# Patient Record
Sex: Female | Born: 1940
Health system: Southern US, Community
[De-identification: ages and names within clinical notes are randomized; demographics above are authoritative.]

## PROBLEM LIST (undated history)

## (undated) DIAGNOSIS — I4891 Unspecified atrial fibrillation: Secondary | ICD-10-CM

## (undated) DIAGNOSIS — I10 Essential (primary) hypertension: Secondary | ICD-10-CM

## (undated) DIAGNOSIS — F419 Anxiety disorder, unspecified: Secondary | ICD-10-CM

## (undated) DIAGNOSIS — R32 Unspecified urinary incontinence: Secondary | ICD-10-CM

## (undated) DIAGNOSIS — R5383 Other fatigue: Secondary | ICD-10-CM

## (undated) DIAGNOSIS — E7800 Pure hypercholesterolemia, unspecified: Secondary | ICD-10-CM

## (undated) DIAGNOSIS — I499 Cardiac arrhythmia, unspecified: Secondary | ICD-10-CM

## (undated) DIAGNOSIS — M1990 Unspecified osteoarthritis, unspecified site: Secondary | ICD-10-CM

## (undated) DIAGNOSIS — F329 Major depressive disorder, single episode, unspecified: Secondary | ICD-10-CM

## (undated) DIAGNOSIS — F32A Depression, unspecified: Secondary | ICD-10-CM

## (undated) HISTORY — DX: Unspecified urinary incontinence: R32

## (undated) HISTORY — PX: CATARACT EXTRACTION, BILATERAL: SHX1313

## (undated) HISTORY — PX: CARPAL TUNNEL RELEASE: SHX101

## (undated) HISTORY — PX: CATARACT EXTRACTION: SUR2

## (undated) HISTORY — PX: ABDOMINAL HYSTERECTOMY: SHX81

## (undated) HISTORY — PX: HERNIA REPAIR: SHX51

## (undated) HISTORY — DX: Other fatigue: R53.83

## (undated) HISTORY — DX: Essential (primary) hypertension: I10

---

## 2009-12-31 HISTORY — PX: MENISCUS REPAIR: SHX5179

## 2015-02-07 DIAGNOSIS — E785 Hyperlipidemia, unspecified: Secondary | ICD-10-CM | POA: Diagnosis not present

## 2015-02-07 DIAGNOSIS — M25441 Effusion, right hand: Secondary | ICD-10-CM | POA: Diagnosis not present

## 2015-02-07 DIAGNOSIS — F411 Generalized anxiety disorder: Secondary | ICD-10-CM | POA: Diagnosis not present

## 2015-02-07 DIAGNOSIS — I499 Cardiac arrhythmia, unspecified: Secondary | ICD-10-CM | POA: Diagnosis not present

## 2015-03-29 DIAGNOSIS — R1031 Right lower quadrant pain: Secondary | ICD-10-CM | POA: Diagnosis not present

## 2015-03-29 DIAGNOSIS — R350 Frequency of micturition: Secondary | ICD-10-CM | POA: Diagnosis not present

## 2015-03-29 DIAGNOSIS — R319 Hematuria, unspecified: Secondary | ICD-10-CM | POA: Diagnosis not present

## 2015-03-29 DIAGNOSIS — R1032 Left lower quadrant pain: Secondary | ICD-10-CM | POA: Diagnosis not present

## 2015-03-30 DIAGNOSIS — R1031 Right lower quadrant pain: Secondary | ICD-10-CM | POA: Diagnosis not present

## 2015-03-30 DIAGNOSIS — R1032 Left lower quadrant pain: Secondary | ICD-10-CM | POA: Diagnosis not present

## 2015-04-21 DIAGNOSIS — R350 Frequency of micturition: Secondary | ICD-10-CM | POA: Diagnosis not present

## 2015-05-06 ENCOUNTER — Emergency Department (HOSPITAL_COMMUNITY): Payer: Medicare Other

## 2015-05-06 ENCOUNTER — Emergency Department (HOSPITAL_COMMUNITY)
Admission: EM | Admit: 2015-05-06 | Discharge: 2015-05-07 | Disposition: A | Discharge status: Home / Self Care | Payer: Medicare Other | Attending: Emergency Medicine | Admitting: Emergency Medicine

## 2015-05-06 ENCOUNTER — Encounter (HOSPITAL_COMMUNITY): Payer: Self-pay

## 2015-05-06 DIAGNOSIS — Z90710 Acquired absence of both cervix and uterus: Secondary | ICD-10-CM | POA: Insufficient documentation

## 2015-05-06 DIAGNOSIS — R208 Other disturbances of skin sensation: Secondary | ICD-10-CM | POA: Diagnosis not present

## 2015-05-06 DIAGNOSIS — E876 Hypokalemia: Secondary | ICD-10-CM | POA: Diagnosis not present

## 2015-05-06 DIAGNOSIS — R1110 Vomiting, unspecified: Secondary | ICD-10-CM | POA: Diagnosis not present

## 2015-05-06 DIAGNOSIS — M5136 Other intervertebral disc degeneration, lumbar region: Secondary | ICD-10-CM | POA: Diagnosis not present

## 2015-05-06 DIAGNOSIS — M4307 Spondylolysis, lumbosacral region: Secondary | ICD-10-CM | POA: Diagnosis not present

## 2015-05-06 DIAGNOSIS — R1011 Right upper quadrant pain: Secondary | ICD-10-CM | POA: Diagnosis not present

## 2015-05-06 DIAGNOSIS — R791 Abnormal coagulation profile: Secondary | ICD-10-CM | POA: Diagnosis not present

## 2015-05-06 DIAGNOSIS — Z79899 Other long term (current) drug therapy: Secondary | ICD-10-CM | POA: Insufficient documentation

## 2015-05-06 DIAGNOSIS — R5383 Other fatigue: Secondary | ICD-10-CM | POA: Diagnosis not present

## 2015-05-06 DIAGNOSIS — M47817 Spondylosis without myelopathy or radiculopathy, lumbosacral region: Secondary | ICD-10-CM | POA: Diagnosis not present

## 2015-05-06 DIAGNOSIS — R109 Unspecified abdominal pain: Secondary | ICD-10-CM

## 2015-05-06 DIAGNOSIS — E780 Pure hypercholesterolemia: Secondary | ICD-10-CM | POA: Diagnosis not present

## 2015-05-06 DIAGNOSIS — M545 Low back pain: Secondary | ICD-10-CM | POA: Diagnosis not present

## 2015-05-06 DIAGNOSIS — F329 Major depressive disorder, single episode, unspecified: Secondary | ICD-10-CM | POA: Insufficient documentation

## 2015-05-06 DIAGNOSIS — R1031 Right lower quadrant pain: Secondary | ICD-10-CM | POA: Diagnosis not present

## 2015-05-06 DIAGNOSIS — Z791 Long term (current) use of non-steroidal anti-inflammatories (NSAID): Secondary | ICD-10-CM | POA: Diagnosis not present

## 2015-05-06 DIAGNOSIS — R1030 Lower abdominal pain, unspecified: Secondary | ICD-10-CM | POA: Diagnosis present

## 2015-05-06 DIAGNOSIS — N281 Cyst of kidney, acquired: Secondary | ICD-10-CM | POA: Diagnosis not present

## 2015-05-06 DIAGNOSIS — R52 Pain, unspecified: Secondary | ICD-10-CM

## 2015-05-06 DIAGNOSIS — M4806 Spinal stenosis, lumbar region: Secondary | ICD-10-CM | POA: Diagnosis not present

## 2015-05-06 DIAGNOSIS — R918 Other nonspecific abnormal finding of lung field: Secondary | ICD-10-CM | POA: Diagnosis not present

## 2015-05-06 HISTORY — DX: Depression, unspecified: F32.A

## 2015-05-06 HISTORY — DX: Pure hypercholesterolemia, unspecified: E78.00

## 2015-05-06 HISTORY — DX: Major depressive disorder, single episode, unspecified: F32.9

## 2015-05-06 LAB — COMPREHENSIVE METABOLIC PANEL WITH GFR
ALT: 18 U/L (ref 14–54)
AST: 27 U/L (ref 15–41)
Albumin: 4.4 g/dL (ref 3.5–5.0)
Alkaline Phosphatase: 73 U/L (ref 38–126)
Anion gap: 8 (ref 5–15)
BUN: 16 mg/dL (ref 6–20)
CO2: 26 mmol/L (ref 22–32)
Calcium: 8.9 mg/dL (ref 8.9–10.3)
Chloride: 98 mmol/L — ABNORMAL LOW (ref 101–111)
Creatinine, Ser: 0.64 mg/dL (ref 0.44–1.00)
GFR calc Af Amer: >60 mL/min
GFR calc non Af Amer: >60 mL/min
Glucose, Bld: 115 mg/dL — ABNORMAL HIGH (ref 70–99)
Potassium: 3.3 mmol/L — ABNORMAL LOW (ref 3.5–5.1)
Sodium: 132 mmol/L — ABNORMAL LOW (ref 135–145)
Total Bilirubin: 0.9 mg/dL (ref 0.3–1.2)
Total Protein: 7.4 g/dL (ref 6.5–8.1)

## 2015-05-06 LAB — URINALYSIS, ROUTINE W REFLEX MICROSCOPIC
Bilirubin Urine: NEGATIVE
GLUCOSE, UA: NEGATIVE mg/dL
Ketones, ur: NEGATIVE mg/dL
LEUKOCYTES UA: NEGATIVE
Nitrite: NEGATIVE
PH: 6 (ref 5.0–8.0)
PROTEIN: NEGATIVE mg/dL
SPECIFIC GRAVITY, URINE: 1.013 (ref 1.005–1.030)
Urobilinogen, UA: 0.2 mg/dL (ref 0.0–1.0)

## 2015-05-06 LAB — CBC WITH DIFFERENTIAL/PLATELET
BASOS PCT: 0 % (ref 0–1)
Basophils Absolute: 0 10*3/uL (ref 0.0–0.1)
EOS ABS: 0 10*3/uL (ref 0.0–0.7)
EOS PCT: 1 % (ref 0–5)
HCT: 37.7 % (ref 36.0–46.0)
Hemoglobin: 12.6 g/dL (ref 12.0–15.0)
Lymphocytes Relative: 23 % (ref 12–46)
Lymphs Abs: 1.6 10*3/uL (ref 0.7–4.0)
MCH: 32 pg (ref 26.0–34.0)
MCHC: 33.4 g/dL (ref 30.0–36.0)
MCV: 95.7 fL (ref 78.0–100.0)
MONO ABS: 0.5 10*3/uL (ref 0.1–1.0)
Monocytes Relative: 7 % (ref 3–12)
NEUTROS PCT: 69 % (ref 43–77)
Neutro Abs: 4.8 10*3/uL (ref 1.7–7.7)
PLATELETS: 201 10*3/uL (ref 150–400)
RBC: 3.94 MIL/uL (ref 3.87–5.11)
RDW: 12.7 % (ref 11.5–15.5)
WBC: 7 10*3/uL (ref 4.0–10.5)

## 2015-05-06 LAB — URINE MICROSCOPIC-ADD ON

## 2015-05-06 LAB — I-STAT TROPONIN, ED: Troponin i, poc: 0.03 ng/mL (ref 0.00–0.08)

## 2015-05-06 LAB — D-DIMER, QUANTITATIVE: D-Dimer, Quant: 1.1 ug/mL-FEU — ABNORMAL HIGH (ref 0.00–0.48)

## 2015-05-06 MED ORDER — SODIUM CHLORIDE 0.9 % IV BOLUS (SEPSIS)
1000.0000 mL | Freq: Once | INTRAVENOUS | Status: AC
  Administered 2015-05-06: 1000 mL via INTRAVENOUS

## 2015-05-06 MED ORDER — IOHEXOL 350 MG/ML SOLN
100.0000 mL | Freq: Once | INTRAVENOUS | Status: AC | PRN
  Administered 2015-05-06: 100 mL via INTRAVENOUS

## 2015-05-06 MED ORDER — SODIUM CHLORIDE 0.9 % IV BOLUS (SEPSIS)
500.0000 mL | Freq: Once | INTRAVENOUS | Status: AC
  Administered 2015-05-06: 500 mL via INTRAVENOUS

## 2015-05-06 MED ORDER — KETOROLAC TROMETHAMINE 30 MG/ML IJ SOLN
30.0000 mg | Freq: Once | INTRAMUSCULAR | Status: AC
  Administered 2015-05-06: 30 mg via INTRAVENOUS
  Filled 2015-05-06: qty 1

## 2015-05-06 MED ORDER — PROMETHAZINE HCL 25 MG/ML IJ SOLN
12.5000 mg | Freq: Once | INTRAMUSCULAR | Status: AC
  Administered 2015-05-06: 12.5 mg via INTRAVENOUS
  Filled 2015-05-06: qty 1

## 2015-05-06 MED ORDER — HYDROMORPHONE HCL 1 MG/ML IJ SOLN
1.0000 mg | Freq: Once | INTRAMUSCULAR | Status: AC
  Administered 2015-05-06: 1 mg via INTRAVENOUS
  Filled 2015-05-06: qty 1

## 2015-05-06 MED ORDER — ONDANSETRON HCL 4 MG/2ML IJ SOLN
4.0000 mg | Freq: Once | INTRAMUSCULAR | Status: AC
  Administered 2015-05-06: 4 mg via INTRAVENOUS
  Filled 2015-05-06: qty 2

## 2015-05-06 MED ORDER — MORPHINE SULFATE 4 MG/ML IJ SOLN
4.0000 mg | Freq: Once | INTRAMUSCULAR | Status: AC
  Administered 2015-05-06: 4 mg via INTRAVENOUS
  Filled 2015-05-06: qty 1

## 2015-05-06 MED ORDER — OXYCODONE-ACETAMINOPHEN 5-325 MG PO TABS: 1.0000 | ORAL_TABLET | ORAL | Status: DC | PRN

## 2015-05-06 MED ORDER — PROMETHAZINE HCL 25 MG RE SUPP: 25.0000 mg | Freq: Four times a day (QID) | RECTAL | Status: DC | PRN

## 2015-05-06 NOTE — ED Notes (Signed)
Patient requests something for pain. Dr. Estell HarpinZammit made aware of same. Verbal orders for 4mg  Morphine IV and 12.5mg  Phenergan IV. Orders placed.

## 2015-05-06 NOTE — ED Provider Notes (Signed)
CSN: [id number]     Arrival date & time 05/06/15  1612 History   First MD Initiated Contact with Patient 05/06/15 1719     Chief Complaint  Patient presents with  . Flank Pain     (Consider location/radiation/quality/duration/timing/severity/associated sxs/prior Treatment) Patient is a 74 y.o. female presenting with flank pain. The history is provided by the patient (the pt complains of severe back pain worse in the right flank.  pt also having vomiting).  Flank Pain This is a new problem. The current episode started 1 to 2 hours ago. The problem occurs constantly. The problem has not changed since onset.Associated symptoms include abdominal pain. Pertinent negatives include no chest pain and no headaches. Nothing aggravates the symptoms. Nothing relieves the symptoms.    Past Medical History  Diagnosis Date  . Hypercholesteremia   . Depression    Past Surgical History  Procedure Laterality Date  . Abdominal hysterectomy    . Carpal tunnel release    . Meniscus repair    . Hernia repair     History reviewed. No pertinent family history. History  Substance Use Topics  . Smoking status: Never Smoker   . Smokeless tobacco: Not on file  . Alcohol Use: Yes     Comment: social   OB History    No data available     Review of Systems  Constitutional: Negative for appetite change and fatigue.  HENT: Negative for congestion, ear discharge and sinus pressure.   Eyes: Negative for discharge.  Respiratory: Negative for cough.   Cardiovascular: Negative for chest pain.  Gastrointestinal: Positive for vomiting and abdominal pain. Negative for diarrhea.  Genitourinary: Positive for flank pain. Negative for frequency and hematuria.  Musculoskeletal: Negative for back pain.  Skin: Negative for rash.  Neurological: Negative for seizures and headaches.  Psychiatric/Behavioral: Negative for hallucinations.      Allergies  Review of patient's allergies indicates no known  allergies.  Home Medications   Prior to Admission medications   Medication Sig Start Date End Date Taking? Authorizing Provider  CALCIUM PO Take 2,400 mg by mouth daily.   Yes Historical Provider, MD  FLUoxetine (PROZAC) 40 MG capsule Take 1 capsule by mouth daily. 03/21/15  Yes Historical Provider, MD  metoprolol tartrate (LOPRESSOR) 25 MG tablet Take 1 tablet by mouth daily. 02/07/15  Yes Historical Provider, MD  naproxen sodium (ANAPROX) 220 MG tablet Take 440 mg by mouth once.   Yes Historical Provider, MD  simvastatin (ZOCOR) 40 MG tablet Take 40 mg by mouth daily.   Yes Historical Provider, MD   BP 145/53 mmHg  Pulse 62  Temp(Src) 97.4 F (36.3 C) (Oral)  Resp 19  SpO2 95% Physical Exam  Constitutional: She is oriented to person, place, and time. She appears well-developed.  HENT:  Head: Normocephalic.  Eyes: Conjunctivae and EOM are normal. No scleral icterus.  Neck: Neck supple. No thyromegaly present.  Cardiovascular: Normal rate and regular rhythm.  Exam reveals no gallop and no friction rub.   No murmur heard. Pulmonary/Chest: No stridor. She has no wheezes. She has no rales. She exhibits no tenderness.  Abdominal: She exhibits no distension. There is no tenderness. There is no rebound.  Genitourinary:  Moderate tender right flank  Musculoskeletal: Normal range of motion. She exhibits no edema.  Lymphadenopathy:    She has no cervical adenopathy.  Neurological: She is oriented to person, place, and time. She exhibits normal muscle tone. Coordination normal.  Skin: No rash noted. No erythema.  Psychiatric: She has a normal mood and affect. Her behavior is normal.    ED Course  Procedures (including critical care time) Labs Review Labs Reviewed  URINALYSIS, ROUTINE W REFLEX MICROSCOPIC - Abnormal; Notable for the following:    Hgb urine dipstick MODERATE (*)    All other components within normal limits  COMPREHENSIVE METABOLIC PANEL - Abnormal; Notable for the  following:    Sodium 132 (*)    Potassium 3.3 (*)    Chloride 98 (*)    Glucose, Bld 115 (*)    All other components within normal limits  D-DIMER, QUANTITATIVE - Abnormal; Notable for the following:    D-Dimer, Quant 1.10 (*)    All other components within normal limits  CBC WITH DIFFERENTIAL/PLATELET  URINE MICROSCOPIC-ADD ON  Rosezena SensorI-STAT TROPOININ, ED    Imaging Review Dg Chest 2 View  05/06/2015   CLINICAL DATA:  Mid back pain and fatigue beginning today. Vomiting.  EXAM: CHEST  2 VIEW  COMPARISON:  None.  FINDINGS: The cardiomediastinal silhouette is within normal limits. The lungs are well inflated and clear. No pleural effusion or pneumothorax is seen. No acute osseous abnormality is identified.  IMPRESSION: No active cardiopulmonary disease.   Electronically Signed   By: Sebastian AcheAllen  Grady   On: 05/06/2015 18:23   Ct Renal Stone Study  05/06/2015   CLINICAL DATA:  Bilateral flank pain since this morning. The pain is now on the right. Vomiting.  EXAM: CT ABDOMEN AND PELVIS WITHOUT CONTRAST  TECHNIQUE: Multidetector CT imaging of the abdomen and pelvis was performed following the standard protocol without IV contrast.  COMPARISON:  None.  FINDINGS: 3.9 cm exophytic mid to upper pole right renal cyst. There is also a 1.6 cm upper pole left renal cyst. No urinary tract calculi or hydronephrosis are seen. Normal non contrasted appearance of the liver, spleen, pancreas, gallbladder and adrenal glands. Surgically absent uterus. No adnexal masses or enlarged lymph nodes. No gastrointestinal abnormalities. Normal appearing appendix.  Minimal linear atelectasis or scarring at the lung bases. Lumbar and lower thoracic spine degenerative changes. These include facet degenerative changes with associated grade 1 anterolisthesis at the L4-5 level. Atheromatous arterial calcifications are noted.  IMPRESSION: 1. No acute abnormality. 2. Bilateral renal cysts.   Electronically Signed   By: Beckie SaltsSteven  Reid M.D.   On:  05/06/2015 18:26     EKG Interpretation   Date/Time:  Friday May 06 2015 20:49:20 EDT Ventricular Rate:  60 PR Interval:  176 QRS Duration: 110 QT Interval:  426 QTC Calculation: 426 R Axis:   -15 Text Interpretation:  Sinus rhythm Atrial premature complex Probable left  atrial enlargement Borderline left axis deviation Confirmed by Artemisia Auvil  MD,  Jomarie LongsJOSEPH [phone]([id number]) on 05/06/2015 9:15:46 PM      MDM   Final diagnoses:  Pain    Flank pain and renal cyst,  tx with  Percocet,  Culture urine,  Follow up with urology     Bethann BerkshireJoseph Navid Lenzen, MD 05/06/15 [phone]

## 2015-05-06 NOTE — Discharge Instructions (Signed)
Follow up with [facility] next week °

## 2015-05-06 NOTE — ED Notes (Signed)
Pt states flank pain bil starting this morning. Now pain is settled into rt side.  Pt has been vomiting. Unknown for fever.  Pt denies increase in urinary frequency or burning but states she normally has some incontinence with frequency anyway.  Pt was seen on 3/30 for abdominal pain and has her results with her. States pain feels different but pain at that time did radiate to the back.

## 2015-05-06 NOTE — ED Notes (Signed)
Pt actively vomiting once in room. Pt family member asking if she can have pain meds. Explained Dr. Needed to access her first.

## 2015-05-07 ENCOUNTER — Emergency Department (HOSPITAL_COMMUNITY): Payer: Medicare Other

## 2015-05-07 ENCOUNTER — Emergency Department (HOSPITAL_COMMUNITY)
Admission: EM | Admit: 2015-05-07 | Discharge: 2015-05-08 | Disposition: A | Discharge status: Home / Self Care | Payer: Medicare Other | Attending: Emergency Medicine | Admitting: Emergency Medicine

## 2015-05-07 ENCOUNTER — Encounter (HOSPITAL_COMMUNITY): Payer: Self-pay | Admitting: Emergency Medicine

## 2015-05-07 DIAGNOSIS — M545 Low back pain, unspecified: Secondary | ICD-10-CM

## 2015-05-07 DIAGNOSIS — R208 Other disturbances of skin sensation: Secondary | ICD-10-CM | POA: Diagnosis not present

## 2015-05-07 DIAGNOSIS — M47817 Spondylosis without myelopathy or radiculopathy, lumbosacral region: Secondary | ICD-10-CM | POA: Diagnosis not present

## 2015-05-07 DIAGNOSIS — M4806 Spinal stenosis, lumbar region: Secondary | ICD-10-CM | POA: Diagnosis not present

## 2015-05-07 DIAGNOSIS — E780 Pure hypercholesterolemia: Secondary | ICD-10-CM | POA: Insufficient documentation

## 2015-05-07 DIAGNOSIS — M5136 Other intervertebral disc degeneration, lumbar region: Secondary | ICD-10-CM | POA: Diagnosis not present

## 2015-05-07 DIAGNOSIS — E876 Hypokalemia: Secondary | ICD-10-CM | POA: Diagnosis not present

## 2015-05-07 DIAGNOSIS — F329 Major depressive disorder, single episode, unspecified: Secondary | ICD-10-CM | POA: Diagnosis not present

## 2015-05-07 DIAGNOSIS — Z79899 Other long term (current) drug therapy: Secondary | ICD-10-CM | POA: Insufficient documentation

## 2015-05-07 DIAGNOSIS — M549 Dorsalgia, unspecified: Secondary | ICD-10-CM

## 2015-05-07 DIAGNOSIS — M4307 Spondylolysis, lumbosacral region: Secondary | ICD-10-CM | POA: Diagnosis not present

## 2015-05-07 LAB — COMPREHENSIVE METABOLIC PANEL
ALK PHOS: 67 U/L (ref 38–126)
ALT: 16 U/L (ref 14–54)
AST: 25 U/L (ref 15–41)
Albumin: 3.7 g/dL (ref 3.5–5.0)
Anion gap: 5 (ref 5–15)
BUN: 11 mg/dL (ref 6–20)
CO2: 27 mmol/L (ref 22–32)
Calcium: 8.6 mg/dL — ABNORMAL LOW (ref 8.9–10.3)
Chloride: 99 mmol/L — ABNORMAL LOW (ref 101–111)
Creatinine, Ser: 0.67 mg/dL (ref 0.44–1.00)
GFR calc non Af Amer: >60 mL/min (ref >60)
GLUCOSE: 103 mg/dL — AB (ref 70–99)
POTASSIUM: 3.1 mmol/L — AB (ref 3.5–5.1)
Sodium: 131 mmol/L — ABNORMAL LOW (ref 135–145)
TOTAL PROTEIN: 6.5 g/dL (ref 6.5–8.1)
Total Bilirubin: 0.8 mg/dL (ref 0.3–1.2)

## 2015-05-07 LAB — CBC WITH DIFFERENTIAL/PLATELET
Basophils Absolute: 0 10*3/uL (ref 0.0–0.1)
Basophils Relative: 0 % (ref 0–1)
EOS ABS: 0.1 10*3/uL (ref 0.0–0.7)
EOS PCT: 1 % (ref 0–5)
HEMATOCRIT: 34.4 % — AB (ref 36.0–46.0)
HEMOGLOBIN: 11.5 g/dL — AB (ref 12.0–15.0)
LYMPHS ABS: 1.1 10*3/uL (ref 0.7–4.0)
Lymphocytes Relative: 18 % (ref 12–46)
MCH: 32 pg (ref 26.0–34.0)
MCHC: 33.4 g/dL (ref 30.0–36.0)
MCV: 95.8 fL (ref 78.0–100.0)
MONOS PCT: 7 % (ref 3–12)
Monocytes Absolute: 0.5 10*3/uL (ref 0.1–1.0)
Neutro Abs: 4.6 10*3/uL (ref 1.7–7.7)
Neutrophils Relative %: 74 % (ref 43–77)
Platelets: 184 10*3/uL (ref 150–400)
RBC: 3.59 MIL/uL — ABNORMAL LOW (ref 3.87–5.11)
RDW: 12.8 % (ref 11.5–15.5)
WBC: 6.2 10*3/uL (ref 4.0–10.5)

## 2015-05-07 MED ORDER — PROMETHAZINE HCL 25 MG/ML IJ SOLN
12.5000 mg | Freq: Once | INTRAMUSCULAR | Status: AC
  Administered 2015-05-07: 12.5 mg via INTRAMUSCULAR
  Filled 2015-05-07: qty 1

## 2015-05-07 MED ORDER — ONDANSETRON HCL 4 MG/2ML IJ SOLN
4.0000 mg | Freq: Once | INTRAMUSCULAR | Status: AC
  Administered 2015-05-07: 4 mg via INTRAVENOUS
  Filled 2015-05-07: qty 2

## 2015-05-07 MED ORDER — LORAZEPAM 2 MG/ML IJ SOLN
1.0000 mg | Freq: Once | INTRAMUSCULAR | Status: AC
  Administered 2015-05-07: 1 mg via INTRAVENOUS
  Filled 2015-05-07: qty 1

## 2015-05-07 MED ORDER — KETOROLAC TROMETHAMINE 15 MG/ML IJ SOLN
15.0000 mg | Freq: Once | INTRAMUSCULAR | Status: AC
  Administered 2015-05-07: 15 mg via INTRAVENOUS
  Filled 2015-05-07: qty 1

## 2015-05-07 MED ORDER — LIDOCAINE 5 % EX PTCH
1.0000 | MEDICATED_PATCH | CUTANEOUS | Status: DC
  Administered 2015-05-08: 1 via TRANSDERMAL
  Filled 2015-05-07: qty 1

## 2015-05-07 MED ORDER — HYDROMORPHONE HCL 1 MG/ML IJ SOLN: 0.5000 mg | Freq: Once | INTRAMUSCULAR | Status: DC

## 2015-05-07 MED ORDER — MORPHINE SULFATE 4 MG/ML IJ SOLN
4.0000 mg | Freq: Once | INTRAMUSCULAR | Status: AC
  Administered 2015-05-07: 4 mg via INTRAVENOUS
  Filled 2015-05-07: qty 1

## 2015-05-07 NOTE — ED Provider Notes (Signed)
CSN: [id number]     Arrival date & time 05/07/15  1918 History   First MD Initiated Contact with Patient 05/07/15 1949     Chief Complaint  Patient presents with  . Back Pain     Patient is a 74 y.o. female presenting with back pain. The history is provided by the patient. No language interpreter was used.  Back Pain  Jenna Hogan presents for evaluation of right flank pain. She was seen in the emergency department last night for similar symptoms. She is visiting from [place]. 2 days ago she developed pain in her low back on both sides. Last night it became intense and severe in nature. She has associated vomiting. She denies any injuries, numbness, weakness, abdominal pain, chest pain, shortness of breath. She has a burning sensation in her right back. She was feeling better after she left the emergency department last night. She took her Percocet as prescribed. Today her pain is uncontrolled and she is unable to sit up without pain. Symptoms are severe, constant, worsening. She has associated vomiting related to the pain.  Past Medical History  Diagnosis Date  . Hypercholesteremia   . Depression    Past Surgical History  Procedure Laterality Date  . Abdominal hysterectomy    . Carpal tunnel release    . Meniscus repair    . Hernia repair     No family history on file. History  Substance Use Topics  . Smoking status: Never Smoker   . Smokeless tobacco: Not on file  . Alcohol Use: Yes     Comment: social   OB History    No data available     Review of Systems  Musculoskeletal: Positive for back pain.  All other systems reviewed and are negative.     Allergies  Review of patient's allergies indicates no known allergies.  Home Medications   Prior to Admission medications   Medication Sig Start Date End Date Taking? Authorizing Provider  CALCIUM PO Take 2,400 mg by mouth daily.    Historical Provider, MD  FLUoxetine (PROZAC) 40 MG capsule Take 1 capsule by mouth daily.  03/21/15   Historical Provider, MD  metoprolol tartrate (LOPRESSOR) 25 MG tablet Take 1 tablet by mouth daily. 02/07/15   Historical Provider, MD  naproxen sodium (ANAPROX) 220 MG tablet Take 440 mg by mouth once.    Historical Provider, MD  oxyCODONE-acetaminophen (PERCOCET) 5-325 MG per tablet Take 1 tablet by mouth every 4 (four) hours as needed. 05/06/15   Bethann Berkshire, MD  promethazine (PHENERGAN) 25 MG suppository Place 1 suppository (25 mg total) rectally every 6 (six) hours as needed for nausea or vomiting. 05/06/15   Bethann Berkshire, MD  simvastatin (ZOCOR) 40 MG tablet Take 40 mg by mouth daily.    Historical Provider, MD   BP 166/67 mmHg  Pulse 65  Temp(Src) 98.7 F (37.1 C) (Oral)  Resp 18  SpO2 95% Physical Exam  Constitutional: She is oriented to person, place, and time. She appears well-developed and well-nourished.  HENT:  Head: Normocephalic and atraumatic.  Cardiovascular: Normal rate and regular rhythm.   No murmur heard. Pulmonary/Chest: Effort normal and breath sounds normal. No respiratory distress.  Abdominal: Soft. There is no tenderness. There is no rebound and no guarding.  Mild right cva tenderness, no rash  Musculoskeletal: She exhibits no edema or tenderness.  Neurological: She is alert and oriented to person, place, and time.  5/5 strength in all four extremities  Skin: Skin is  warm and dry.  Psychiatric: She has a normal mood and affect. Her behavior is normal.  Nursing note and vitals reviewed.   ED Course  Procedures (including critical care time) Labs Review Labs Reviewed  COMPREHENSIVE METABOLIC PANEL - Abnormal; Notable for the following:    Sodium 131 (*)    Potassium 3.1 (*)    Chloride 99 (*)    Glucose, Bld 103 (*)    Calcium 8.6 (*)    All other components within normal limits  CBC WITH DIFFERENTIAL/PLATELET - Abnormal; Notable for the following:    RBC 3.59 (*)    Hemoglobin 11.5 (*)    HCT 34.4 (*)    All other components within normal  limits  URINALYSIS, ROUTINE W REFLEX MICROSCOPIC    Imaging Review Dg Chest 2 View  05/06/2015   CLINICAL DATA:  Mid back pain and fatigue beginning today. Vomiting.  EXAM: CHEST  2 VIEW  COMPARISON:  None.  FINDINGS: The cardiomediastinal silhouette is within normal limits. The lungs are well inflated and clear. No pleural effusion or pneumothorax is seen. No acute osseous abnormality is identified.  IMPRESSION: No active cardiopulmonary disease.   Electronically Signed   By: Sebastian AcheAllen  Grady   On: 05/06/2015 18:23   Ct Angio Chest Pe W/cm &/or Wo Cm  05/06/2015   CLINICAL DATA:  Back pain and elevated D-dimer  EXAM: CT ANGIOGRAPHY CHEST WITH CONTRAST  TECHNIQUE: Multidetector CT imaging of the chest was performed using the standard protocol during bolus administration of intravenous contrast. Multiplanar CT image reconstructions and MIPs were obtained to evaluate the vascular anatomy.  CONTRAST:  100mL OMNIPAQUE IOHEXOL 350 MG/ML SOLN  COMPARISON:  None  FINDINGS: Cardiovascular: There is good opacification of the pulmonary arteries. There is no pulmonary embolism. The thoracic aorta is normal in caliber and intact.  Lungs: Minimal posterior base atelectatic-appearing opacities are present. The lungs are otherwise clear.  Central airways: Patent  Effusions: None  Lymphadenopathy: None  Esophagus: Unremarkable  Upper abdomen: No significant abnormality. Upper pole left renal cyst is incompletely imaged on this study but was visible on the earlier abdominal CT.  Musculoskeletal: Moderate degenerative disc changes are present throughout the thoracic spine. No significant musculoskeletal lesions.  Review of the MIP images confirms the above findings.  IMPRESSION: Negative for pulmonary embolism. Mild atelectatic appearing opacities are present in the posterior dependent bases.   Electronically Signed   By: Ellery Plunkaniel R Mitchell M.D.   On: 05/06/2015 23:49   Ct Renal Stone Study  05/06/2015   CLINICAL DATA:  Bilateral  flank pain since this morning. The pain is now on the right. Vomiting.  EXAM: CT ABDOMEN AND PELVIS WITHOUT CONTRAST  TECHNIQUE: Multidetector CT imaging of the abdomen and pelvis was performed following the standard protocol without IV contrast.  COMPARISON:  None.  FINDINGS: 3.9 cm exophytic mid to upper pole right renal cyst. There is also a 1.6 cm upper pole left renal cyst. No urinary tract calculi or hydronephrosis are seen. Normal non contrasted appearance of the liver, spleen, pancreas, gallbladder and adrenal glands. Surgically absent uterus. No adnexal masses or enlarged lymph nodes. No gastrointestinal abnormalities. Normal appearing appendix.  Minimal linear atelectasis or scarring at the lung bases. Lumbar and lower thoracic spine degenerative changes. These include facet degenerative changes with associated grade 1 anterolisthesis at the L4-5 level. Atheromatous arterial calcifications are noted.  IMPRESSION: 1. No acute abnormality. 2. Bilateral renal cysts.   Electronically Signed   By: Beckie SaltsSteven  Reid  M.D.   On: 05/06/2015 18:26   [place] Abdomen Limited Ruq  05/06/2015   CLINICAL DATA:  Right upper quadrant pain  EXAM: US ABDOMEN LIMITED - RIGHT UPPER QUADRANT  COMPARISON:  None.  FINDINGS: Gallbladder:  No gallstones or wall thickening visualized. No sonographic Murphy sign noted.  Common bile duct:  Diameter: 4.6 mm, normal  Liver:  No focal lesion identified. Within normal limits in parenchymal echogenicity.  IMPRESSION: Normal   Electronically Signed   By: Ellery Plunkaniel R Mitchell M.D.   On: 05/06/2015 23:20     EKG Interpretation None      MDM   Final diagnoses:  Low back pain  Back pain  Spondylosis of lumbosacral region without myelopathy or radiculopathy  Hypokalemia    Patient here for evaluation of back pain. Neurologically intact on examination. Patient had extensive workup yesterday in the emergency department was CT chest, CT abdomen, abdominal ultrasound. MRI demonstrates  degenerative disc disease as well as bulging disks. There are multiple areas of severe canal stenosis. Discussed with family outpatient neurosurgery follow-up as well as home care. Recommend control continue pain medications. Provide a prescription for Zofran for nausea as well as Phenergan. Patient did not fill the Phenergan suppository prescription due to cost. Discussed finding of hyponatremia and hypokalemia. Patient is tolerating oral fluids in the department. Patient did get considerable sedation due to Ativan, will monitor prior to discharge home.    Tilden FossaElizabeth Dayjah Selman, MD 05/08/15 [phone]

## 2015-05-07 NOTE — ED Notes (Signed)
Pt from home complains of low back pain. Seen last pm for same.  Patient is here due to pain is out of control

## 2015-05-07 NOTE — ED Notes (Signed)
Patient transported to MRI 

## 2015-05-08 DIAGNOSIS — M47817 Spondylosis without myelopathy or radiculopathy, lumbosacral region: Secondary | ICD-10-CM | POA: Diagnosis not present

## 2015-05-08 MED ORDER — PROMETHAZINE HCL 25 MG PO TABS: 25.0000 mg | ORAL_TABLET | Freq: Three times a day (TID) | ORAL | Status: DC | PRN

## 2015-05-08 MED ORDER — ONDANSETRON HCL 4 MG PO TABS: 4.0000 mg | ORAL_TABLET | Freq: Three times a day (TID) | ORAL | Status: DC | PRN

## 2015-05-08 MED ORDER — LIDOCAINE 5 % EX PTCH: 1.0000 | MEDICATED_PATCH | CUTANEOUS | Status: DC

## 2015-05-08 MED ORDER — POTASSIUM CHLORIDE CRYS ER 20 MEQ PO TBCR
20.0000 meq | EXTENDED_RELEASE_TABLET | Freq: Once | ORAL | Status: AC
Start: 2015-05-08 — End: 2015-05-08
  Administered 2015-05-08: 20 meq via ORAL
  Filled 2015-05-08: qty 1

## 2015-05-08 NOTE — ED Notes (Addendum)
Pt is very sleepy could not walk  by herself.

## 2015-05-08 NOTE — Discharge Instructions (Signed)
Back Pain, Adult  Low back pain is very common. About 1 in 5 people have back pain.The cause of low back pain is rarely dangerous. The pain often gets better over time.About half of people with a sudden onset of back pain feel better in just 2 weeks. About 8 in 10 people feel better by 6 weeks.   CAUSES  Some common causes of back pain include:   Strain of the muscles or ligaments supporting the spine.   Wear and tear (degeneration) of the spinal discs.   Arthritis.   Direct injury to the back.  DIAGNOSIS  Most of the time, the direct cause of low back pain is not known.However, back pain can be treated effectively even when the exact cause of the pain is unknown.Answering your caregiver's questions about your overall health and symptoms is one of the most accurate ways to make sure the cause of your pain is not dangerous. If your caregiver needs more information, he or she may order lab work or imaging tests (X-rays or MRIs).However, even if imaging tests show changes in your back, this usually does not require surgery.  HOME CARE INSTRUCTIONS  For many people, back pain returns.Since low back pain is rarely dangerous, it is often a condition that people can learn to manageon their own.    Remain active. It is stressful on the back to sit or stand in one place. Do not sit, drive, or stand in one place for more than 30 minutes at a time. Take short walks on level surfaces as soon as pain allows.Try to increase the length of time you walk each day.   Do not stay in bed.Resting more than 1 or 2 days can delay your recovery.   Do not avoid exercise or work.Your body is made to move.It is not dangerous to be active, even though your back may hurt.Your back will likely heal faster if you return to being active before your pain is gone.   Pay attention to your body when you bend and lift. Many people have less discomfortwhen lifting if they bend their knees, keep the load close to their bodies,and  avoid twisting. Often, the most comfortable positions are those that put less stress on your recovering back.   Find a comfortable position to sleep. Use a firm mattress and lie on your side with your knees slightly bent. If you lie on your back, put a pillow under your knees.   Only take over-the-counter or prescription medicines as directed by your caregiver. Over-the-counter medicines to reduce pain and inflammation are often the most helpful.Your caregiver may prescribe muscle relaxant drugs.These medicines help dull your pain so you can more quickly return to your normal activities and healthy exercise.   Put ice on the injured area.   Put ice in a plastic bag.   Place a towel between your skin and the bag.   Leave the ice on for 15-20 minutes, 03-04 times a day for the first 2 to 3 days. After that, ice and heat may be alternated to reduce pain and spasms.   Ask your caregiver about trying back exercises and gentle massage. This may be of some benefit.   Avoid feeling anxious or stressed.Stress increases muscle tension and can worsen back pain.It is important to recognize when you are anxious or stressed and learn ways to manage it.Exercise is a great option.  SEEK MEDICAL CARE IF:   You have pain that is not relieved with rest or   medicine.   You have pain that does not improve in 1 week.   You have new symptoms.   You are generally not feeling well.  SEEK IMMEDIATE MEDICAL CARE IF:    You have pain that radiates from your back into your legs.   You develop new bowel or bladder control problems.   You have unusual weakness or numbness in your arms or legs.   You develop nausea or vomiting.   You develop abdominal pain.   You feel faint.  Document Released: 12/17/2005 Document Revised: 06/17/2012 Document Reviewed: 04/20/2014  [employer] Patient Information 2015 [employer]. This information is not intended to replace advice given to you by your health care provider. Make sure you  discuss any questions you have with your health care provider.  Spinal Stenosis  Spinal stenosis is an abnormal narrowing of the canals of your spine (vertebrae).  CAUSES   Spinal stenosis is caused by areas of bone pushing into the central canals of your vertebrae. This condition can be present at birth (congenital). It also may be caused by arthritic deterioration of your vertebrae (spinal degeneration).   SYMPTOMS    Pain that is generally worse with activities, particularly standing and walking.   Numbness, tingling, hot or cold sensations, weakness, or weariness in your legs.   Frequent episodes of falling.   A foot-slapping gait that leads to muscle weakness.  DIAGNOSIS   Spinal stenosis is diagnosed with the use of magnetic resonance imaging (MRI) or computed tomography (CT).  TREATMENT   Initial therapy for spinal stenosis focuses on the management of the pain and other symptoms associated with the condition. These therapies include:   Practicing postural changes to lessen pressure on your nerves.   Exercises to strengthen the core of your body.   Loss of excess body weight.   The use of nonsteroidal anti-inflammatory medicines to reduce swelling and inflammation in your nerves.  When therapies to manage pain are not successful, surgery to treat spinal stenosis may be recommended. This surgery involves removing excess bone, which puts pressure on your nerve roots. During this surgery (laminectomy), the posterior boney arch (lamina) and excess bone around the facet joints are removed.  Document Released: 03/08/2004 Document Revised: 05/03/2014 Document Reviewed: 03/27/2013  [employer] Patient Information 2015 [employer]. This information is not intended to replace advice given to you by your health care provider. Make sure you discuss any questions you have with your health care provider.

## 2015-05-09 ENCOUNTER — Other Ambulatory Visit: Payer: Self-pay | Admitting: Nurse Practitioner

## 2015-05-09 DIAGNOSIS — M545 Low back pain: Secondary | ICD-10-CM | POA: Diagnosis not present

## 2015-05-09 DIAGNOSIS — R1084 Generalized abdominal pain: Secondary | ICD-10-CM

## 2015-05-09 DIAGNOSIS — N390 Urinary tract infection, site not specified: Secondary | ICD-10-CM | POA: Diagnosis not present

## 2015-05-12 ENCOUNTER — Ambulatory Visit
Admission: RE | Admit: 2015-05-12 | Discharge: 2015-05-12 | Disposition: A | Discharge status: Home / Self Care | Payer: Medicare Other | Source: Ambulatory Visit | Attending: Nurse Practitioner | Admitting: Nurse Practitioner

## 2015-05-12 DIAGNOSIS — R1011 Right upper quadrant pain: Secondary | ICD-10-CM | POA: Diagnosis not present

## 2015-05-12 DIAGNOSIS — I700 Atherosclerosis of aorta: Secondary | ICD-10-CM | POA: Diagnosis not present

## 2015-05-12 DIAGNOSIS — R112 Nausea with vomiting, unspecified: Secondary | ICD-10-CM | POA: Diagnosis not present

## 2015-05-12 DIAGNOSIS — R1084 Generalized abdominal pain: Secondary | ICD-10-CM

## 2015-05-12 MED ORDER — IOHEXOL 350 MG/ML SOLN
75.0000 mL | Freq: Once | INTRAVENOUS | Status: AC | PRN
  Administered 2015-05-12: 75 mL via INTRAVENOUS

## 2015-05-13 ENCOUNTER — Other Ambulatory Visit: Payer: Medicare Other

## 2015-05-13 DIAGNOSIS — M4316 Spondylolisthesis, lumbar region: Secondary | ICD-10-CM | POA: Diagnosis not present

## 2015-05-13 DIAGNOSIS — M4806 Spinal stenosis, lumbar region: Secondary | ICD-10-CM | POA: Diagnosis not present

## 2015-05-13 DIAGNOSIS — Z6830 Body mass index (BMI) 30.0-30.9, adult: Secondary | ICD-10-CM | POA: Diagnosis not present

## 2015-06-07 DIAGNOSIS — M4316 Spondylolisthesis, lumbar region: Secondary | ICD-10-CM | POA: Diagnosis not present

## 2015-06-07 DIAGNOSIS — M47816 Spondylosis without myelopathy or radiculopathy, lumbar region: Secondary | ICD-10-CM | POA: Diagnosis not present

## 2015-06-08 DIAGNOSIS — M4316 Spondylolisthesis, lumbar region: Secondary | ICD-10-CM | POA: Diagnosis not present

## 2015-06-13 DIAGNOSIS — M4316 Spondylolisthesis, lumbar region: Secondary | ICD-10-CM | POA: Diagnosis not present

## 2015-06-13 DIAGNOSIS — M47816 Spondylosis without myelopathy or radiculopathy, lumbar region: Secondary | ICD-10-CM | POA: Diagnosis not present

## 2015-06-21 DIAGNOSIS — I10 Essential (primary) hypertension: Secondary | ICD-10-CM | POA: Diagnosis not present

## 2015-06-21 DIAGNOSIS — M4806 Spinal stenosis, lumbar region: Secondary | ICD-10-CM | POA: Diagnosis not present

## 2015-06-21 DIAGNOSIS — R030 Elevated blood-pressure reading, without diagnosis of hypertension: Secondary | ICD-10-CM | POA: Diagnosis not present

## 2015-06-21 DIAGNOSIS — Z6831 Body mass index (BMI) 31.0-31.9, adult: Secondary | ICD-10-CM | POA: Diagnosis not present

## 2015-06-21 DIAGNOSIS — Z6830 Body mass index (BMI) 30.0-30.9, adult: Secondary | ICD-10-CM | POA: Diagnosis not present

## 2015-06-21 DIAGNOSIS — M4316 Spondylolisthesis, lumbar region: Secondary | ICD-10-CM | POA: Diagnosis not present

## 2015-07-12 DIAGNOSIS — H40013 Open angle with borderline findings, low risk, bilateral: Secondary | ICD-10-CM | POA: Diagnosis not present

## 2015-07-12 DIAGNOSIS — H2513 Age-related nuclear cataract, bilateral: Secondary | ICD-10-CM | POA: Diagnosis not present

## 2015-07-14 DIAGNOSIS — H2513 Age-related nuclear cataract, bilateral: Secondary | ICD-10-CM | POA: Diagnosis not present

## 2015-07-20 DIAGNOSIS — H2511 Age-related nuclear cataract, right eye: Secondary | ICD-10-CM | POA: Diagnosis not present

## 2015-07-26 DIAGNOSIS — H269 Unspecified cataract: Secondary | ICD-10-CM | POA: Diagnosis not present

## 2015-07-26 DIAGNOSIS — I10 Essential (primary) hypertension: Secondary | ICD-10-CM | POA: Diagnosis not present

## 2015-08-01 DIAGNOSIS — I159 Secondary hypertension, unspecified: Secondary | ICD-10-CM | POA: Diagnosis not present

## 2015-08-03 DIAGNOSIS — I499 Cardiac arrhythmia, unspecified: Secondary | ICD-10-CM | POA: Diagnosis not present

## 2015-08-03 DIAGNOSIS — Z87891 Personal history of nicotine dependence: Secondary | ICD-10-CM | POA: Diagnosis not present

## 2015-08-03 DIAGNOSIS — H2511 Age-related nuclear cataract, right eye: Secondary | ICD-10-CM | POA: Diagnosis not present

## 2015-08-03 DIAGNOSIS — E780 Pure hypercholesterolemia: Secondary | ICD-10-CM | POA: Diagnosis not present

## 2015-08-03 DIAGNOSIS — F419 Anxiety disorder, unspecified: Secondary | ICD-10-CM | POA: Diagnosis not present

## 2015-08-03 DIAGNOSIS — I10 Essential (primary) hypertension: Secondary | ICD-10-CM | POA: Diagnosis not present

## 2015-08-03 DIAGNOSIS — E785 Hyperlipidemia, unspecified: Secondary | ICD-10-CM | POA: Diagnosis not present

## 2015-08-03 DIAGNOSIS — F329 Major depressive disorder, single episode, unspecified: Secondary | ICD-10-CM | POA: Diagnosis not present

## 2015-08-17 DIAGNOSIS — Z90710 Acquired absence of both cervix and uterus: Secondary | ICD-10-CM | POA: Diagnosis not present

## 2015-08-17 DIAGNOSIS — F329 Major depressive disorder, single episode, unspecified: Secondary | ICD-10-CM | POA: Diagnosis not present

## 2015-08-17 DIAGNOSIS — J309 Allergic rhinitis, unspecified: Secondary | ICD-10-CM | POA: Diagnosis not present

## 2015-08-17 DIAGNOSIS — Z961 Presence of intraocular lens: Secondary | ICD-10-CM | POA: Diagnosis not present

## 2015-08-17 DIAGNOSIS — Z791 Long term (current) use of non-steroidal anti-inflammatories (NSAID): Secondary | ICD-10-CM | POA: Diagnosis not present

## 2015-08-17 DIAGNOSIS — H2512 Age-related nuclear cataract, left eye: Secondary | ICD-10-CM | POA: Diagnosis not present

## 2015-08-17 DIAGNOSIS — Z9841 Cataract extraction status, right eye: Secondary | ICD-10-CM | POA: Diagnosis not present

## 2015-08-17 DIAGNOSIS — E785 Hyperlipidemia, unspecified: Secondary | ICD-10-CM | POA: Diagnosis not present

## 2015-08-17 DIAGNOSIS — Z87891 Personal history of nicotine dependence: Secondary | ICD-10-CM | POA: Diagnosis not present

## 2015-11-28 DIAGNOSIS — Z23 Encounter for immunization: Secondary | ICD-10-CM | POA: Diagnosis not present

## 2016-02-17 DIAGNOSIS — R197 Diarrhea, unspecified: Secondary | ICD-10-CM | POA: Diagnosis not present

## 2016-02-21 DIAGNOSIS — R197 Diarrhea, unspecified: Secondary | ICD-10-CM | POA: Diagnosis not present

## 2016-02-22 DIAGNOSIS — R197 Diarrhea, unspecified: Secondary | ICD-10-CM | POA: Diagnosis not present

## 2016-03-07 DIAGNOSIS — Z1211 Encounter for screening for malignant neoplasm of colon: Secondary | ICD-10-CM | POA: Diagnosis not present

## 2016-03-23 DIAGNOSIS — E785 Hyperlipidemia, unspecified: Secondary | ICD-10-CM | POA: Diagnosis not present

## 2016-03-27 DIAGNOSIS — Z1211 Encounter for screening for malignant neoplasm of colon: Secondary | ICD-10-CM | POA: Diagnosis not present

## 2016-03-27 DIAGNOSIS — R197 Diarrhea, unspecified: Secondary | ICD-10-CM | POA: Diagnosis not present

## 2016-03-27 DIAGNOSIS — I10 Essential (primary) hypertension: Secondary | ICD-10-CM | POA: Diagnosis not present

## 2016-05-24 ENCOUNTER — Other Ambulatory Visit: Payer: Self-pay | Admitting: Anesthesiology

## 2016-05-24 DIAGNOSIS — M47816 Spondylosis without myelopathy or radiculopathy, lumbar region: Secondary | ICD-10-CM | POA: Diagnosis not present

## 2016-05-24 DIAGNOSIS — R1032 Left lower quadrant pain: Secondary | ICD-10-CM | POA: Diagnosis not present

## 2016-05-29 ENCOUNTER — Other Ambulatory Visit: Payer: Self-pay | Admitting: Anesthesiology

## 2016-05-29 DIAGNOSIS — R1032 Left lower quadrant pain: Secondary | ICD-10-CM

## 2016-05-30 DIAGNOSIS — R1032 Left lower quadrant pain: Secondary | ICD-10-CM | POA: Diagnosis not present

## 2016-05-30 DIAGNOSIS — S76311A Strain of muscle, fascia and tendon of the posterior muscle group at thigh level, right thigh, initial encounter: Secondary | ICD-10-CM | POA: Diagnosis not present

## 2016-08-30 DIAGNOSIS — J0100 Acute maxillary sinusitis, unspecified: Secondary | ICD-10-CM | POA: Diagnosis not present

## 2016-08-30 DIAGNOSIS — J029 Acute pharyngitis, unspecified: Secondary | ICD-10-CM | POA: Diagnosis not present

## 2016-10-22 DIAGNOSIS — M65311 Trigger thumb, right thumb: Secondary | ICD-10-CM | POA: Diagnosis not present

## 2016-10-22 DIAGNOSIS — M65341 Trigger finger, right ring finger: Secondary | ICD-10-CM | POA: Diagnosis not present

## 2016-10-30 DIAGNOSIS — Z23 Encounter for immunization: Secondary | ICD-10-CM | POA: Diagnosis not present

## 2017-02-14 DIAGNOSIS — M2550 Pain in unspecified joint: Secondary | ICD-10-CM | POA: Diagnosis not present

## 2017-02-14 DIAGNOSIS — R5383 Other fatigue: Secondary | ICD-10-CM | POA: Diagnosis not present

## 2017-02-14 DIAGNOSIS — R1084 Generalized abdominal pain: Secondary | ICD-10-CM | POA: Diagnosis not present

## 2017-02-14 DIAGNOSIS — I499 Cardiac arrhythmia, unspecified: Secondary | ICD-10-CM | POA: Diagnosis not present

## 2017-02-14 DIAGNOSIS — M6530 Trigger finger, unspecified finger: Secondary | ICD-10-CM | POA: Diagnosis not present

## 2017-02-14 DIAGNOSIS — E785 Hyperlipidemia, unspecified: Secondary | ICD-10-CM | POA: Diagnosis not present

## 2017-02-15 DIAGNOSIS — M2550 Pain in unspecified joint: Secondary | ICD-10-CM | POA: Diagnosis not present

## 2017-02-15 DIAGNOSIS — E785 Hyperlipidemia, unspecified: Secondary | ICD-10-CM | POA: Diagnosis not present

## 2017-02-15 DIAGNOSIS — R1084 Generalized abdominal pain: Secondary | ICD-10-CM | POA: Diagnosis not present

## 2017-02-15 DIAGNOSIS — R5383 Other fatigue: Secondary | ICD-10-CM | POA: Diagnosis not present

## 2017-02-26 DIAGNOSIS — M65341 Trigger finger, right ring finger: Secondary | ICD-10-CM | POA: Diagnosis not present

## 2017-03-24 IMAGING — CR DG CHEST 2V
2 series · 2 of 2 positions shown · non-contrast
Comparison: None.

CLINICAL DATA: Mid back pain and fatigue beginning today. Vomiting.

EXAM:
CHEST  2 VIEW

[w chest lat]
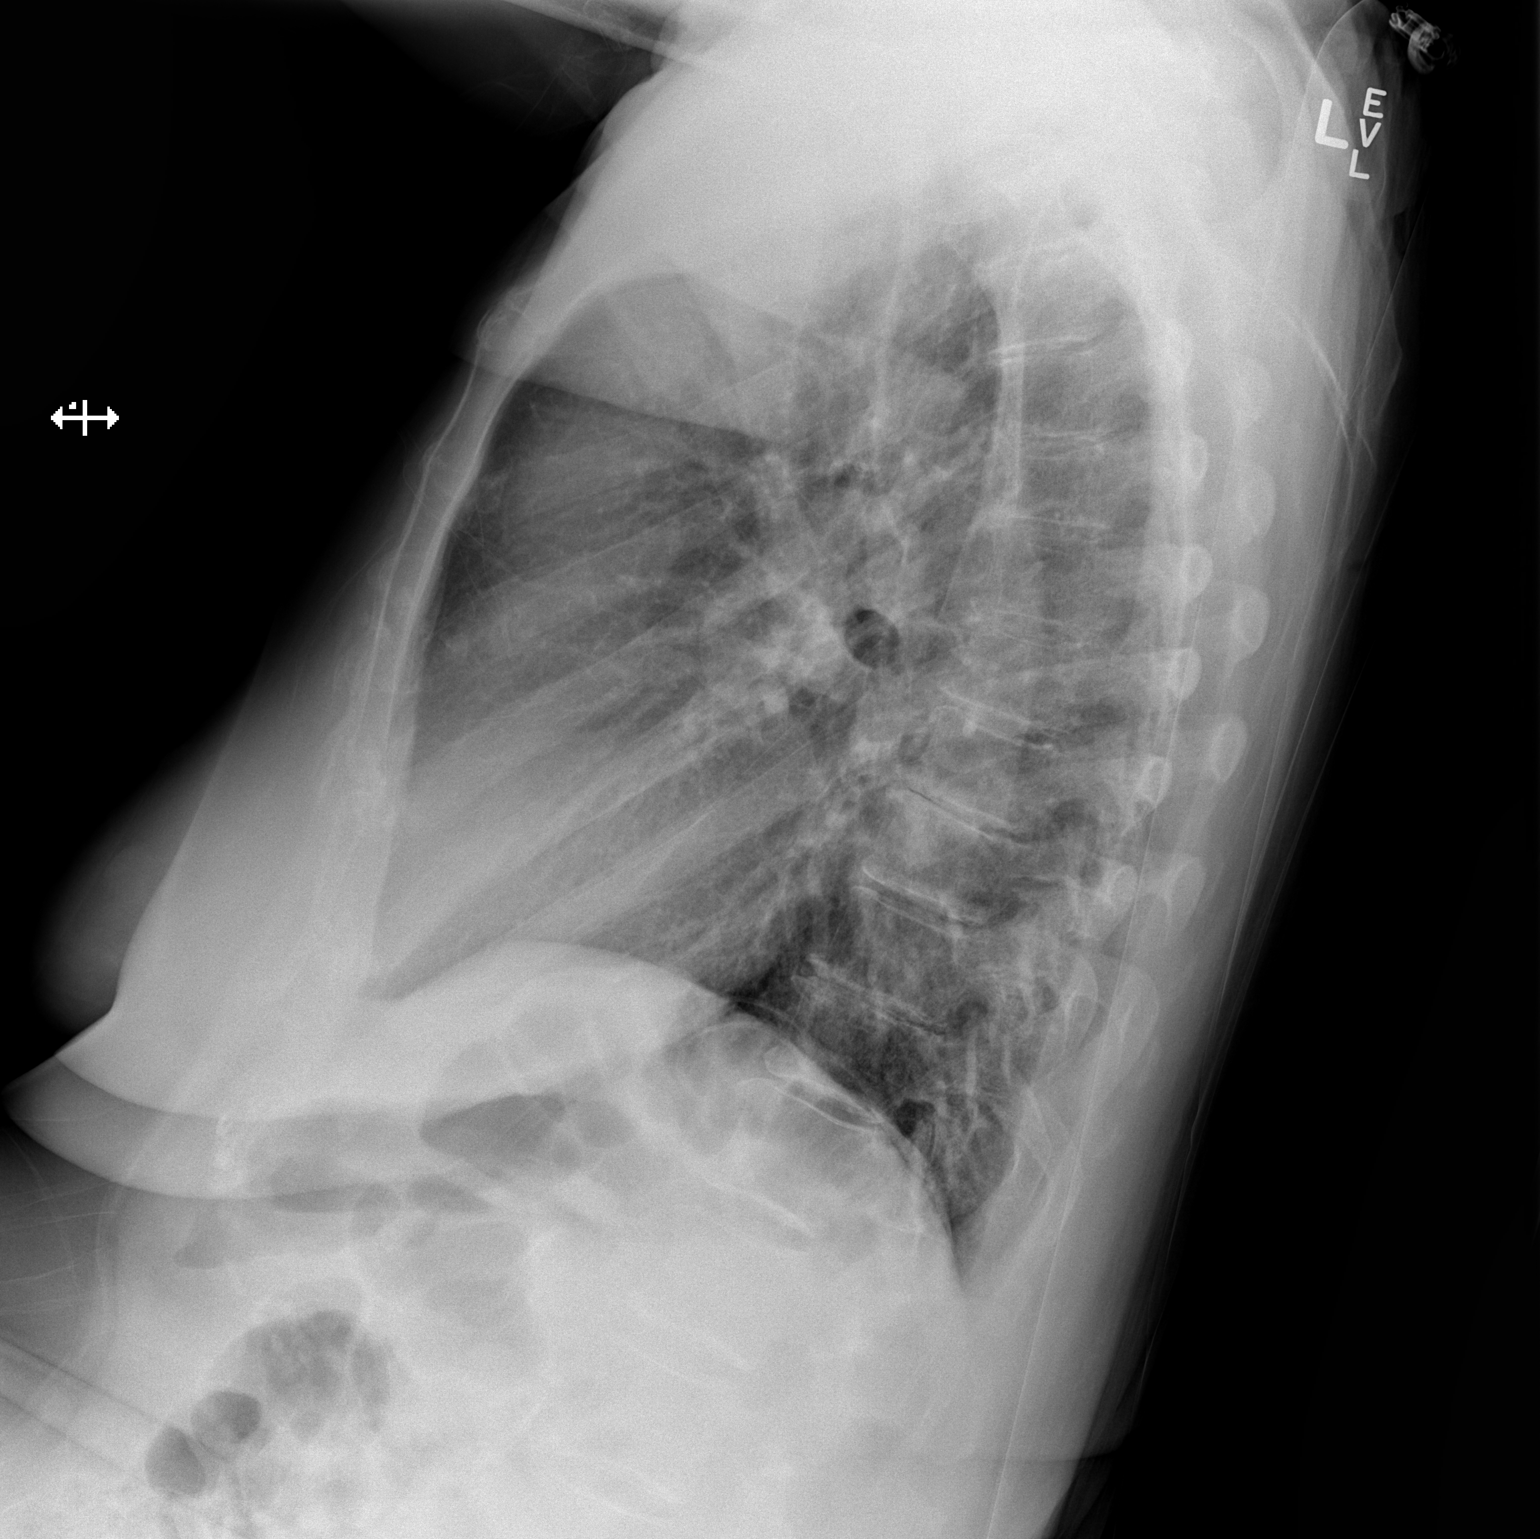

[x chest ap]
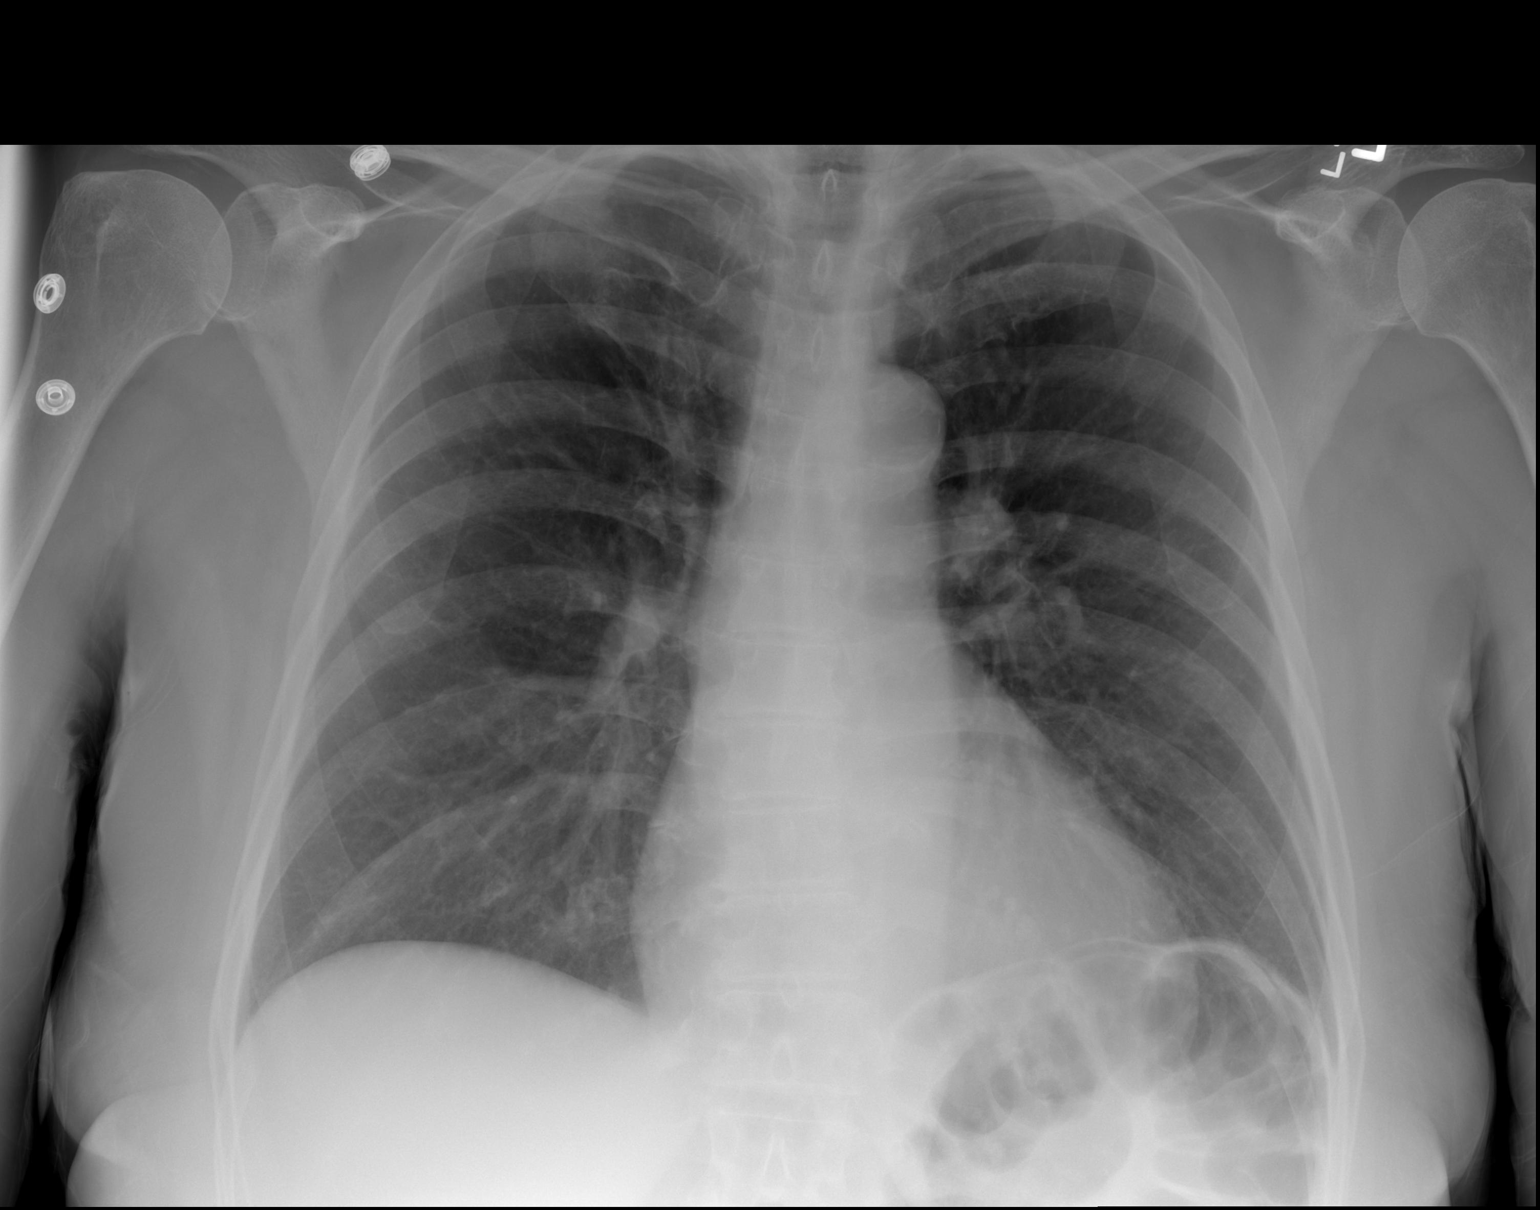

[2 of 2 positions shown; findings below may reference images not displayed]

FINDINGS: The cardiomediastinal silhouette is within normal limits. The lungs
are well inflated and clear. No pleural effusion or pneumothorax is
seen. No acute osseous abnormality is identified.
IMPRESSION: No active cardiopulmonary disease.

## 2017-03-25 IMAGING — MR MR LUMBAR SPINE W/O CM
4 of 6 series · 17 of 48 positions shown · non-contrast
Comparison: None.

CLINICAL DATA: Initial evaluation for acute severe back pain.

EXAM:
MRI LUMBAR SPINE WITHOUT CONTRAST
TECHNIQUE: Multiplanar, multisequence MR imaging of the lumbar spine was
performed. No intravenous contrast was administered.

[Series 3: T1 · sagittal · 4.0mm · 0.55mm/px · 3 of 13 slices shown]
[im 1/13]
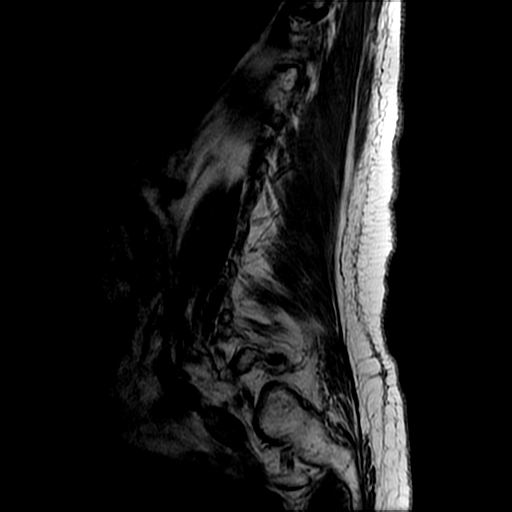
[im 7/13]
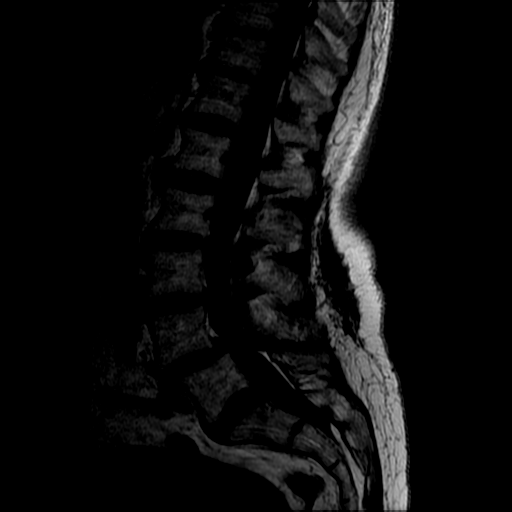
[im 13/13]
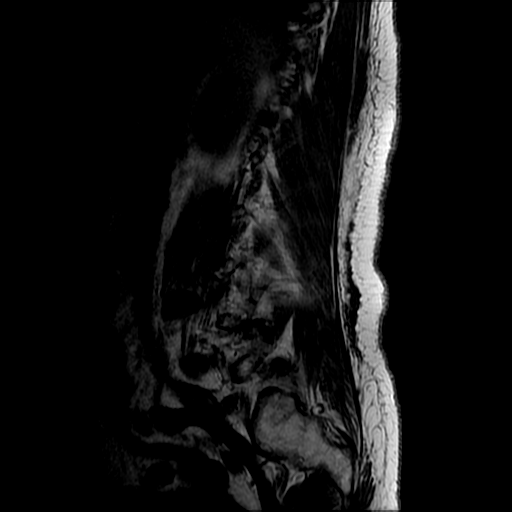

[Series 4: T2 · sagittal · 4.0mm · 0.55mm/px · 5 of 13 slices shown (1 of 3)]
[im 1/13]
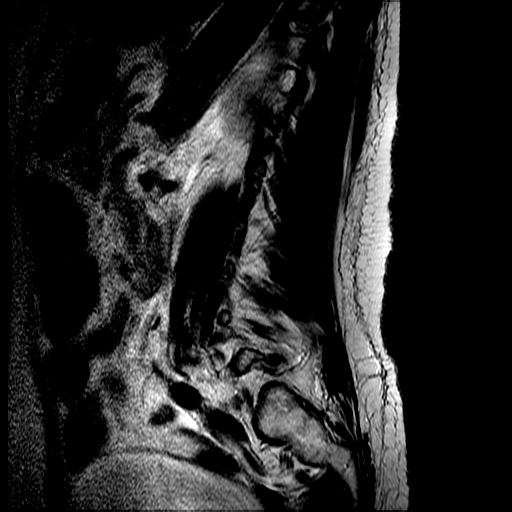
[im 4/13]
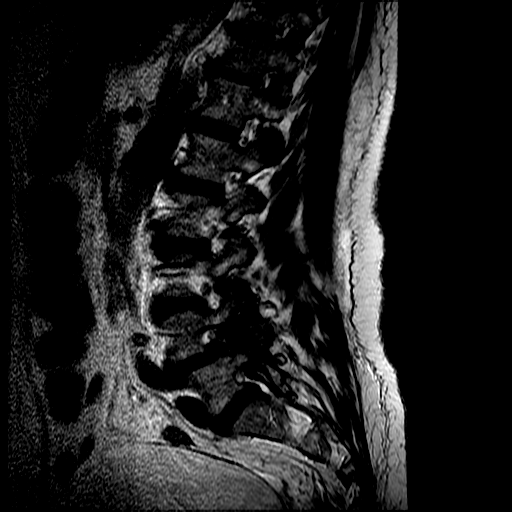
[im 7/13]
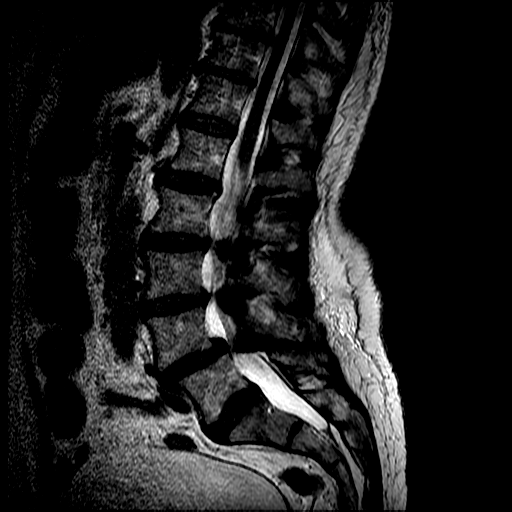
[im 10/13]
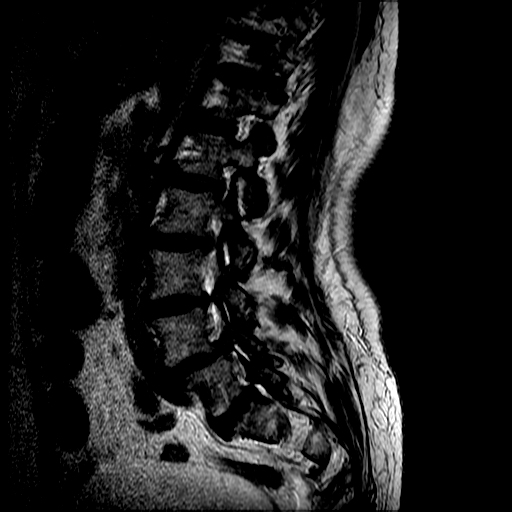
[im 13/13]
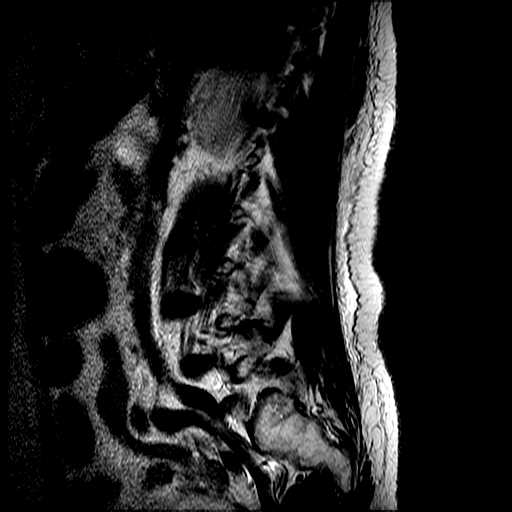

[Series 6: T2 · axial · 4.0mm · 0.39mm/px · z∈[-105,+64]mm · 6 of 33 slices shown (2 of 3)]
[im 1/33]
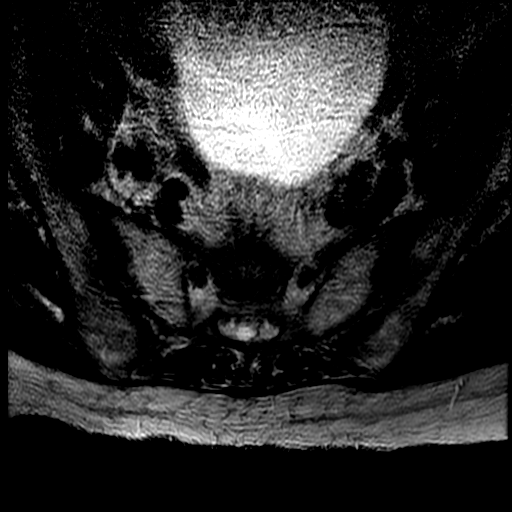
[im 4/33]
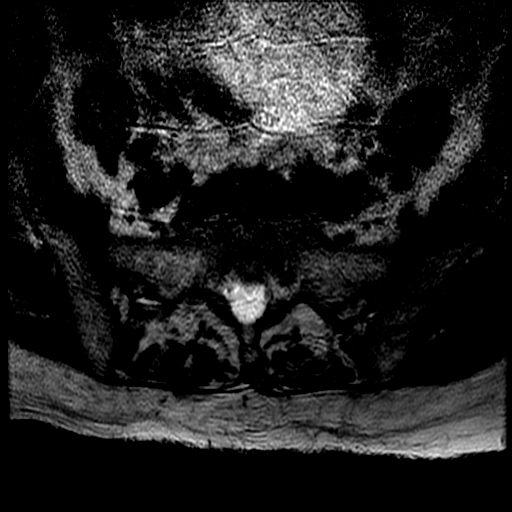
[im 7/33]
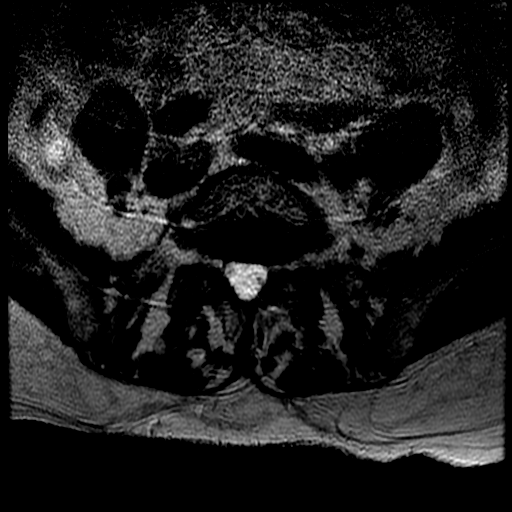
[im 10/33]
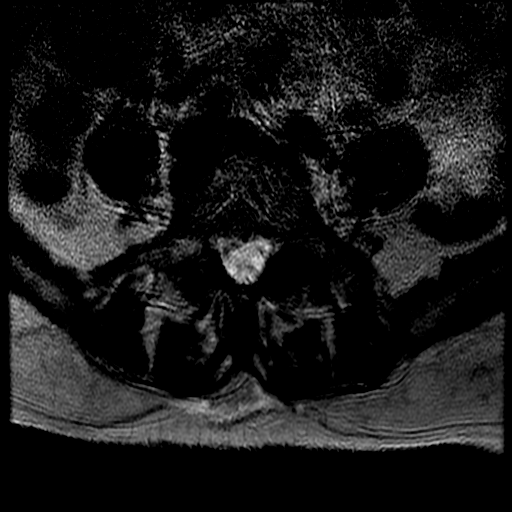
[im 17/33]
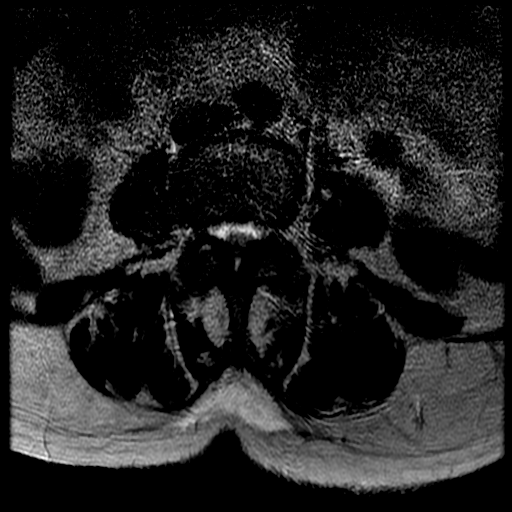
[im 29/33]
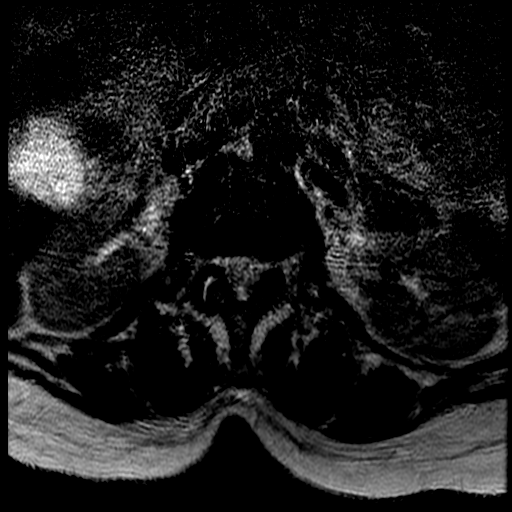

[Series 8: T2 · axial · 4.0mm · 0.39mm/px · z∈[-91,+64]mm · 3 of 33 slices shown (3 of 3)]
[im 4/33]
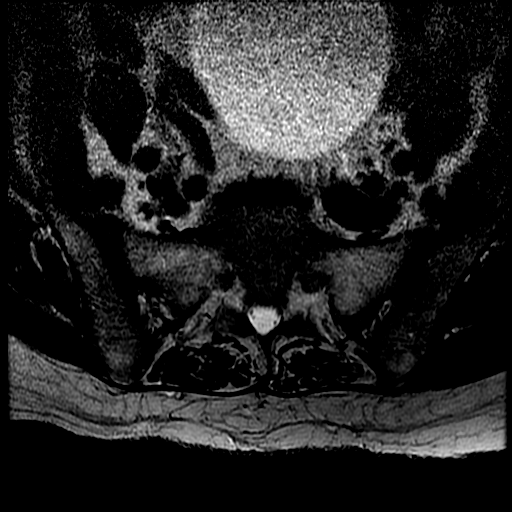
[im 17/33]
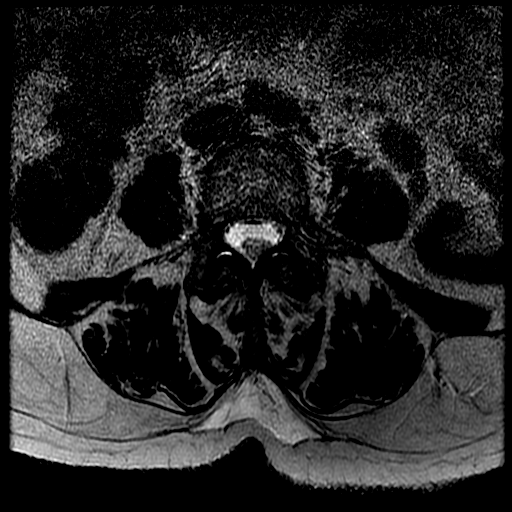
[im 29/33]
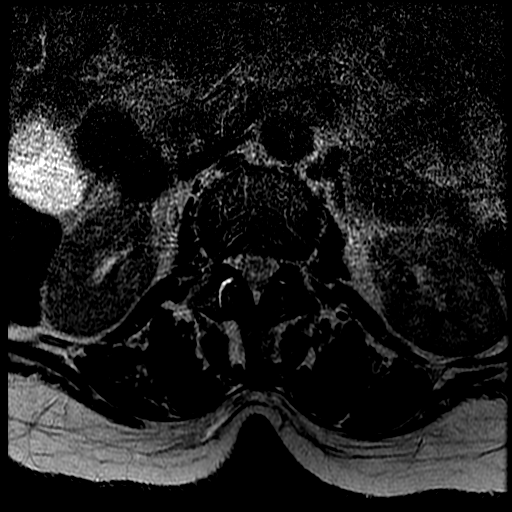

[17 of 48 positions shown; findings below may reference images not displayed]

FINDINGS: Study is somewhat degraded by motion artifact.

For the purposes of this dictation, the lowest well-formed
intervertebral disc spaces presumed to be the L5-S1 level, and there
presumed to be 5 lumbar type vertebral bodies.

There is 7 mm anterolisthesis of L4 on L5. Otherwise, vertebral
bodies are normally aligned with preservation of the normal lumbar
lordosis. Vertebral body heights are well maintained. No fracture.
No definite pars defects. Signal intensity within the vertebral body
bone marrow is normal. No marrow edema.

Conus medullaris terminates normally at the L1 level. Signal
intensity within the visualized cord is unremarkable. Nerve roots of
the cauda equina within normal limits.

Paraspinous soft tissues within normal limits.

T10-11: Mild disc bulge with disc desiccation and intervertebral
disc space narrowing. No significant stenosis.

T11-12: Diffuse disc bulge with mild disc desiccation. Facet
arthrosis. No significant canal stenosis. There is severe right with
mild left foraminal narrowing.

T12-L1:  No significant disc bulge or disc protrusion.  No stenosis.

L1-2: Diffuse disc bulge with disc desiccation. No focal disc
herniation. Associated annular fissure present posteriorly.
Superimposed facet and ligamentous hypertrophy. Asymmetric fluid
present within the right L1-2 facet. No significant canal stenosis.
Mild right foraminal narrowing.

L2-3: Diffuse disc bulge with disc desiccation and intervertebral
disc space narrowing. Small annular fissure posteriorly. No definite
focal disc protrusion. Moderate facet and ligamentous hypertrophy.
There is resultant moderate to severe canal and lateral recess
stenosis. No significant foraminal narrowing.

L3-4: Diffuse disc bulge with disc desiccation. Tiny superimposed
central disc protrusion noted. Moderate facet and ligamentous
hypertrophy. There is resultant moderate canal stenosis. No
significant foraminal narrowing.

L4-5: 7 mm anterior listhesis of L4 on L5. There is resultant
diffuse disc bulge with associated posterior annular fissure. Severe
bilateral facet arthrosis, left greater than right. Asymmetric fluid
within the right L4-5 facet. Resultant severe canal stenosis.
Moderate bilateral foraminal narrowing present, slightly worse on
the left.

L5-S1: Diffuse disc bulge with disc desiccation and mild
intervertebral disc space narrowing. Moderate facet arthrosis. No
significant canal stenosis. No focal disc herniation. Mild bilateral
foraminal narrowing, slightly worse on the left.
IMPRESSION: 1. No acute abnormality within the lumbar spine.
2. 7 mm anterolisthesis of L4 on L5 with secondary disc bulge and
facet arthropathy, with resultant severe canal stenosis.
3. Additional multilevel degenerative disc disease as detailed
above, most severe at the L3-4 level with there is moderate to
severe canal narrowing as well. Please see above report for full
description of these findings.

## 2017-06-03 ENCOUNTER — Ambulatory Visit (INDEPENDENT_AMBULATORY_CARE_PROVIDER_SITE_OTHER): Payer: Medicare Other | Admitting: Primary Care

## 2017-06-03 ENCOUNTER — Encounter: Payer: Self-pay | Admitting: Primary Care

## 2017-06-03 DIAGNOSIS — I10 Essential (primary) hypertension: Secondary | ICD-10-CM | POA: Insufficient documentation

## 2017-06-03 DIAGNOSIS — E785 Hyperlipidemia, unspecified: Secondary | ICD-10-CM | POA: Diagnosis not present

## 2017-06-03 DIAGNOSIS — E669 Obesity, unspecified: Secondary | ICD-10-CM | POA: Diagnosis not present

## 2017-06-03 NOTE — Assessment & Plan Note (Signed)
Lipid panel in March 2018 normal per patient. Continue Simvastatin.

## 2017-06-03 NOTE — Patient Instructions (Signed)
It's important to improve your diet by reducing consumption of fast food, fried food, processed snack foods, sugary drinks. Increase consumption of fresh vegetables and fruits, whole grains, water.  Ensure you are drinking 64 ounces of water daily.  Start exercising. You should be getting 150 minutes of moderate intensity exercise weekly.  It was a pleasure to meet you today! Please don't hesitate to call me with any questions. Welcome to [facility]!

## 2017-06-03 NOTE — Assessment & Plan Note (Signed)
Managed on Lopressor 50 mg BID. BP stable, continue current regimen.

## 2017-06-03 NOTE — Progress Notes (Signed)
   Subjective:    Patient ID: Jenna Hogan, female    DOB: Dec 27, 1941, 76 y.o.   MRN: [id number]  HPI  Jenna Hogan is a 76 year old female who presents today to establish care and discuss the problems mentioned below. Will obtain old records. [place] in [place] and [place]. She had a full work up for fatigue in early January 2018 which was negative.   1) Essential Hypertension: Currently managed on Lopressor 50 mg BID. Her BP in the office is 116/78. She denies palpitations, chest pain, shortness of breath, lower extremity swelling. She has refills from her MD in [place].  2) Hyperlipidemia: Currently managed on simvastatin 40 mg. Her last cholesterol check was March 2018 and was normal. She denies myalgias. She has refills from her MD in [place].  3) Urinary Incontinence: Present for the past 10 years and no worse. Wears a panty liner. History of hysterectomy 10 years ago. Stress incontinence.   Review of Systems  Respiratory: Negative for shortness of breath.   Cardiovascular: Negative for chest pain and leg swelling.  Genitourinary:       Urinary incontinence, stable  Musculoskeletal: Negative for myalgias.  Neurological: Negative for dizziness and headaches.       Past Medical History:  Diagnosis Date  . Depression   . Essential hypertension   . Fatigue   . Hypercholesteremia   . Urinary incontinence      Social History   Social History  . Marital status: Married    Spouse name: N/A  . Number of children: N/A  . Years of education: N/A   Occupational History  . Not on file.   Social History Main Topics  . Smoking status: Never Smoker  . Smokeless tobacco: Never Used  . Alcohol use Yes     Comment: social  . Drug use: No  . Sexual activity: Not on file   Other Topics Concern  . Not on file   Social History Narrative   Married.   Retired. Once worked in Community education officerinsurance.   Enjoys crocheting, spending time with family, traveling.      Past Surgical History:  Procedure Laterality Date  . ABDOMINAL HYSTERECTOMY    . CARPAL TUNNEL RELEASE  1998, 2002   x2  . HERNIA REPAIR    . MENISCUS REPAIR Left 2011    Family History  Problem Relation Age of Onset  . Heart disease Mother   . Arthritis Mother     No Known Allergies  Current Outpatient Prescriptions on File Prior to Visit  Medication Sig Dispense Refill  . simvastatin (ZOCOR) 40 MG tablet Take 40 mg by mouth daily.     No current facility-administered medications on file prior to visit.     BP 116/78   Pulse 60   Temp 97.7 F (36.5 C) (Oral)   Ht 5\' 2"  (1.575 m)   Wt 178 lb 6.4 oz (80.9 kg)   SpO2 97%   BMI 32.63 kg/m    Objective:   Physical Exam  Constitutional: She appears well-nourished.  Neck: Neck supple.  Cardiovascular: Normal rate and regular rhythm.   Pulmonary/Chest: Effort normal and breath sounds normal.  Skin: Skin is warm and dry.  Psychiatric: She has a normal mood and affect.          Assessment & Plan:

## 2017-06-03 NOTE — Assessment & Plan Note (Signed)
Long discussion today regarding treatment for obesity. Recommended improvement in diet and regular exercise.

## 2017-08-12 DIAGNOSIS — H40013 Open angle with borderline findings, low risk, bilateral: Secondary | ICD-10-CM | POA: Diagnosis not present

## 2017-08-12 DIAGNOSIS — H47231 Glaucomatous optic atrophy, right eye: Secondary | ICD-10-CM | POA: Diagnosis not present

## 2017-09-17 ENCOUNTER — Encounter: Payer: Self-pay | Admitting: Primary Care

## 2017-09-17 ENCOUNTER — Ambulatory Visit (INDEPENDENT_AMBULATORY_CARE_PROVIDER_SITE_OTHER): Payer: Medicare Other | Admitting: Primary Care

## 2017-09-17 ENCOUNTER — Other Ambulatory Visit: Payer: Self-pay | Admitting: Primary Care

## 2017-09-17 VITALS — BP 136/84 | HR 54 | Temp 97.8°F | Ht 62.0 in | Wt 168.0 lb

## 2017-09-17 DIAGNOSIS — R1032 Left lower quadrant pain: Secondary | ICD-10-CM

## 2017-09-17 LAB — POC URINALSYSI DIPSTICK (AUTOMATED)
Bilirubin, UA: NEGATIVE
Glucose, UA: NEGATIVE
KETONES UA: NEGATIVE
Leukocytes, UA: NEGATIVE
Nitrite, UA: NEGATIVE
Protein, UA: NEGATIVE
Spec Grav, UA: 1.02 (ref 1.010–1.025)
Urobilinogen, UA: 0.2 E.U./dL
pH, UA: 5.5 (ref 5.0–8.0)

## 2017-09-17 NOTE — Patient Instructions (Signed)
Stop by the front desk and speak with either Shirlee Limerick or Zella Ball regarding your ultrasound. I'll be in touch once I receive the results.  It was a pleasure to see you today!

## 2017-09-17 NOTE — Addendum Note (Signed)
Addended by: Tawnya Crook on: 09/17/2017 01:51 PM   Modules accepted: Orders

## 2017-09-17 NOTE — Progress Notes (Signed)
Subjective:    Patient ID: Jenna Hogan, female    DOB: 04-May-1941, 76 y.o.   MRN: [id number]  HPI  Jenna Hogan is a 76 year old female with a history of bilateral inguinal hernia repair in her 82's who presents today with a chief complaint of abdominal pain. Her pain is located to the left lower groin. Treated with steroid injections for acute back pain after a fall 2 years ago. One year ago she was lifting heavy objects for several days and developed left groin pain. She was evaluated by the same orthopedist who suspected her pain to be secondary to a hernia and recommended ultrasound. She never completed the Ultrasound. Over the past one year she's experienced intermittent left groin pain. Her pain is worse after getting up in the morning, laying at night; improved gradually throughout the day with intermittent pain sometimes during the day.   She denies urinary frequency, hematuria, fevers, nausea/vomiting, diarrhea, constipation, changes in appetite, vaginal discharge.   Review of Systems  Constitutional: Negative for appetite change, chills and fever.  Gastrointestinal: Positive for abdominal pain. Negative for blood in stool, constipation, diarrhea, nausea and vomiting.  Genitourinary: Negative for dysuria, frequency, hematuria, pelvic pain and vaginal discharge.       Past Medical History:  Diagnosis Date  . Depression   . Essential hypertension   . Fatigue   . Hypercholesteremia   . Urinary incontinence      Social History   Social History  . Marital status: Married    Spouse name: N/A  . Number of children: N/A  . Years of education: N/A   Occupational History  . Not on file.   Social History Main Topics  . Smoking status: Never Smoker  . Smokeless tobacco: Never Used  . Alcohol use Yes     Comment: social  . Drug use: No  . Sexual activity: Not on file   Other Topics Concern  . Not on file   Social History Narrative   Married.   Retired. Once worked  in Community education officer.   Enjoys crocheting, spending time with family, traveling.     Past Surgical History:  Procedure Laterality Date  . ABDOMINAL HYSTERECTOMY    . CARPAL TUNNEL RELEASE  1998, 2002   x2  . HERNIA REPAIR Bilateral   . MENISCUS REPAIR Left 2011    Family History  Problem Relation Age of Onset  . Heart disease Mother   . Arthritis Mother     No Known Allergies  Current Outpatient Prescriptions on File Prior to Visit  Medication Sig Dispense Refill  . metoprolol tartrate (LOPRESSOR) 50 MG tablet Taking 1 tablet in the am and taking 1/2 tablet in the pm    . simvastatin (ZOCOR) 40 MG tablet Take 40 mg by mouth daily.     No current facility-administered medications on file prior to visit.     BP 136/84   Pulse (!) 54   Temp 97.8 F (36.6 C) (Oral)   Ht  (1.575 m)   Wt 168 lb (76.2 kg)   SpO2 97%   BMI 30.73 kg/m    Objective:   Physical Exam  Constitutional: She appears well-nourished. She does not appear ill. No distress.  Neck: Neck supple.  Cardiovascular: Normal rate.   Pulmonary/Chest: Effort normal.  Abdominal: Soft. Normal appearance and bowel sounds are normal. There is no tenderness. There is no CVA tenderness.    Skin: Skin is warm and dry.  Assessment & Plan:

## 2017-09-23 ENCOUNTER — Ambulatory Visit
Admission: RE | Admit: 2017-09-23 | Discharge: 2017-09-23 | Disposition: A | Discharge status: Home / Self Care | Payer: Medicare Other | Source: Ambulatory Visit | Attending: Primary Care | Admitting: Primary Care

## 2017-09-23 DIAGNOSIS — R1032 Left lower quadrant pain: Secondary | ICD-10-CM | POA: Insufficient documentation

## 2017-09-23 DIAGNOSIS — M79605 Pain in left leg: Secondary | ICD-10-CM | POA: Diagnosis not present

## 2017-10-15 DIAGNOSIS — T1592XA Foreign body on external eye, part unspecified, left eye, initial encounter: Secondary | ICD-10-CM | POA: Diagnosis not present

## 2017-11-01 DIAGNOSIS — Z23 Encounter for immunization: Secondary | ICD-10-CM | POA: Diagnosis not present

## 2017-11-20 DIAGNOSIS — Z9181 History of falling: Secondary | ICD-10-CM | POA: Diagnosis not present

## 2017-11-20 DIAGNOSIS — F411 Generalized anxiety disorder: Secondary | ICD-10-CM | POA: Diagnosis not present

## 2017-11-20 DIAGNOSIS — Z0001 Encounter for general adult medical examination with abnormal findings: Secondary | ICD-10-CM | POA: Diagnosis not present

## 2017-11-20 DIAGNOSIS — I10 Essential (primary) hypertension: Secondary | ICD-10-CM | POA: Diagnosis not present

## 2017-11-20 DIAGNOSIS — E785 Hyperlipidemia, unspecified: Secondary | ICD-10-CM | POA: Diagnosis not present

## 2017-11-20 DIAGNOSIS — Z6829 Body mass index (BMI) 29.0-29.9, adult: Secondary | ICD-10-CM | POA: Diagnosis not present

## 2017-11-20 DIAGNOSIS — I499 Cardiac arrhythmia, unspecified: Secondary | ICD-10-CM | POA: Diagnosis not present

## 2017-11-20 DIAGNOSIS — Z1389 Encounter for screening for other disorder: Secondary | ICD-10-CM | POA: Diagnosis not present

## 2017-11-27 DIAGNOSIS — F411 Generalized anxiety disorder: Secondary | ICD-10-CM | POA: Diagnosis not present

## 2017-11-27 DIAGNOSIS — I499 Cardiac arrhythmia, unspecified: Secondary | ICD-10-CM | POA: Diagnosis not present

## 2017-11-27 DIAGNOSIS — E785 Hyperlipidemia, unspecified: Secondary | ICD-10-CM | POA: Diagnosis not present

## 2018-01-31 DIAGNOSIS — J069 Acute upper respiratory infection, unspecified: Secondary | ICD-10-CM | POA: Diagnosis not present

## 2018-01-31 DIAGNOSIS — Z6829 Body mass index (BMI) 29.0-29.9, adult: Secondary | ICD-10-CM | POA: Diagnosis not present

## 2018-06-23 ENCOUNTER — Encounter: Payer: Self-pay | Admitting: *Deleted

## 2018-06-23 ENCOUNTER — Encounter: Payer: Self-pay | Admitting: Family Medicine

## 2018-06-23 ENCOUNTER — Ambulatory Visit (INDEPENDENT_AMBULATORY_CARE_PROVIDER_SITE_OTHER): Payer: Medicare Other | Admitting: Family Medicine

## 2018-06-23 VITALS — BP 140/64 | HR 55 | Temp 97.7°F | Ht 62.0 in | Wt 163.0 lb

## 2018-06-23 DIAGNOSIS — R1031 Right lower quadrant pain: Secondary | ICD-10-CM

## 2018-06-23 LAB — POC URINALSYSI DIPSTICK (AUTOMATED)
Bilirubin, UA: NEGATIVE
Glucose, UA: NEGATIVE
Ketones, UA: NEGATIVE
Leukocytes, UA: NEGATIVE
Nitrite, UA: NEGATIVE
PH UA: 6 (ref 5.0–8.0)
PROTEIN UA: NEGATIVE
SPEC GRAV UA: 1.01 (ref 1.010–1.025)
UROBILINOGEN UA: 0.2 U/dL

## 2018-06-23 MED ORDER — SULFAMETHOXAZOLE-TRIMETHOPRIM 800-160 MG PO TABS: 1.0000 | ORAL_TABLET | Freq: Two times a day (BID) | ORAL | 0 refills | Status: AC

## 2018-06-23 NOTE — Progress Notes (Signed)
Dr. Karleen HampshireSpencer T. Myca Perno, MD, CAQ Sports [facility] [address] [place], [id number] Phone: [phone] Fax: [phone]  06/23/2018  Patient: Jenna Hogan, MRN: [id number], DOB: [birth year], 77 y.o.  Primary Physician:  Doreene Nestlark, Katherine K, NP   Chief Complaint  Patient presents with  . Groin Pain   Subjective:   Jenna SabinaChristine Meloy is a 77 y.o. very pleasant female patient who presents with the following:  Pain in her pelvis and groin. No dysurina. Some urgency.  Been ongoing for a week, some pain off and on even before this.  Does feel some pressure in the bottom.  No exterior rashes or discomfort.  S/p hysterectomy  Past Medical History, Surgical History, Social History, Family History, Problem List, Medications, and Allergies have been reviewed and updated if relevant.  Patient Active Problem List   Diagnosis Date Noted  . Hyperlipidemia 06/03/2017  . Essential hypertension 06/03/2017  . Obesity (BMI 30.0-34.9) 06/03/2017    Past Medical History:  Diagnosis Date  . Depression   . Essential hypertension   . Fatigue   . Hypercholesteremia   . Urinary incontinence     Past Surgical History:  Procedure Laterality Date  . ABDOMINAL HYSTERECTOMY    . CARPAL TUNNEL RELEASE  1998, 2002   x2  . HERNIA REPAIR Bilateral   . MENISCUS REPAIR Left 2011    Social History   Socioeconomic History  . Marital status: Married    Spouse name: Not on file  . Number of children: Not on file  . Years of education: Not on file  . Highest education level: Not on file  Occupational History  . Not on file  Social Needs  . Financial resource strain: Not on file  . Food insecurity:    Worry: Not on file    Inability: Not on file  . Transportation needs:    Medical: Not on file    Non-medical: Not on file  Tobacco Use  . Smoking status: Never Smoker  . Smokeless tobacco: Never Used  Substance and Sexual Activity  . Alcohol use:  Yes    Comment: social  . Drug use: No  . Sexual activity: Not on file  Lifestyle  . Physical activity:    Days per week: Not on file    Minutes per session: Not on file  . Stress: Not on file  Relationships  . Social connections:    Talks on phone: Not on file    Gets together: Not on file    Attends religious service: Not on file    Active member of club or organization: Not on file    Attends meetings of clubs or organizations: Not on file    Relationship status: Not on file  . Intimate partner violence:    Fear of current or ex partner: Not on file    Emotionally abused: Not on file    Physically abused: Not on file    Forced sexual activity: Not on file  Other Topics Concern  . Not on file  Social History Narrative   Married.   Retired. Once worked in Community education officerinsurance.   Enjoys crocheting, spending time with family, traveling.     Family History  Problem Relation Age of Onset  . Heart disease Mother   . Arthritis Mother     No Known Allergies  Medication list reviewed and updated in full in [facility].   GEN: No acute illnesses, no fevers, chills. GI:  No n/v/d, eating normally Pulm: No SOB Interactive and getting along well at home.  Otherwise, ROS is as per the HPI.  Objective:   BP 140/64   Pulse (!) 55   Temp 97.7 F (36.5 C) (Oral)   Ht 5\' 2"  (1.575 m)   Wt 163 lb (73.9 kg)   BMI 29.81 kg/m    GEN: WDWN, A&Ox4,NAD. Non-toxic HEENT: Atraumatc, normocephalic. CV: RRR, No M/G/R PULM: CTA B, No wheezes, crackles, or rhonchi ABD: S, NT, ND, +BS, no rebound. No CVAT. No suprapubic tenderness. EXT: No c/c/e   Hip B rom is full with no pain  Laboratory and Imaging Data:  Assessment and Plan:   Groin pain, right - Plan: POCT Urinalysis Dipstick (Automated), Urine Culture  Unclear source, uti is possible - treat for now with cx f/u  No clear oa on exam  Follow-up: No follow-ups on file.  Meds ordered this encounter  Medications  .  sulfamethoxazole-trimethoprim (BACTRIM DS,SEPTRA DS) 800-160 MG tablet    Sig: Take 1 tablet by mouth 2 (two) times daily for 7 days.    Dispense:  14 tablet    Refill:  0   Orders Placed This Encounter  Procedures  . Urine Culture  . POCT Urinalysis Dipstick (Automated)    Signed,  Cashius Grandstaff T. Jackie Littlejohn, MD   Allergies as of 06/23/2018   No Known Allergies     Medication List        Accurate as of 06/23/18  2:03 PM. Always use your most recent med list.          ALEVE 220 MG tablet Generic drug:  naproxen sodium Take 220 mg by mouth daily as needed.   ALLEGRA PO Take 1 tablet by mouth daily as needed.   metoprolol tartrate 50 MG tablet Commonly known as:  LOPRESSOR Taking 1 tablet in the am and taking 1/2 tablet in the pm   simvastatin 40 MG tablet Commonly known as:  ZOCOR Take 40 mg by mouth daily.   sulfamethoxazole-trimethoprim 800-160 MG tablet Commonly known as:  BACTRIM DS,SEPTRA DS Take 1 tablet by mouth 2 (two) times daily for 7 days.

## 2018-06-24 LAB — URINE CULTURE
MICRO NUMBER:: 90751715
SPECIMEN QUALITY: ADEQUATE

## 2018-10-08 DIAGNOSIS — Z23 Encounter for immunization: Secondary | ICD-10-CM | POA: Diagnosis not present

## 2018-12-16 DIAGNOSIS — I10 Essential (primary) hypertension: Secondary | ICD-10-CM | POA: Diagnosis not present

## 2018-12-16 DIAGNOSIS — E785 Hyperlipidemia, unspecified: Secondary | ICD-10-CM | POA: Diagnosis not present

## 2018-12-16 DIAGNOSIS — Z6831 Body mass index (BMI) 31.0-31.9, adult: Secondary | ICD-10-CM | POA: Diagnosis not present

## 2018-12-19 DIAGNOSIS — E785 Hyperlipidemia, unspecified: Secondary | ICD-10-CM | POA: Diagnosis not present

## 2018-12-19 DIAGNOSIS — I10 Essential (primary) hypertension: Secondary | ICD-10-CM | POA: Diagnosis not present

## 2019-10-08 ENCOUNTER — Other Ambulatory Visit: Payer: Self-pay | Admitting: Neurological Surgery

## 2019-10-08 DIAGNOSIS — M48062 Spinal stenosis, lumbar region with neurogenic claudication: Secondary | ICD-10-CM

## 2019-10-14 ENCOUNTER — Telehealth: Payer: Self-pay | Admitting: Primary Care

## 2019-10-14 DIAGNOSIS — F418 Other specified anxiety disorders: Secondary | ICD-10-CM

## 2019-10-14 MED ORDER — DIAZEPAM 2 MG PO TABS: ORAL_TABLET | ORAL | 0 refills | Status: DC

## 2019-10-14 NOTE — Telephone Encounter (Signed)
Please notify patient that I sent two pills for her to use for anxiety prior to MRI if needed. She is to take the diazepam tablet 30 minutes prior to MRI, repeat in 1 hour if no reduction in anxiety. This will cause drowsiness, she cannot drive with this medication.

## 2019-10-14 NOTE — Telephone Encounter (Signed)
Spoken and notified patient of Kate Clark's comments. Patient verbalized understanding.  

## 2019-10-14 NOTE — Telephone Encounter (Signed)
Message left for patient to return my call.  

## 2019-10-14 NOTE — Telephone Encounter (Signed)
Pt said she is having MRI on 10/25/19 and needs and anxiety pill prescribed because she is claustrophobic. Please advise.   [facility] at [address] of [employer] [phone]

## 2019-10-25 ENCOUNTER — Ambulatory Visit
Admission: RE | Admit: 2019-10-25 | Discharge: 2019-10-25 | Disposition: A | Discharge status: Home / Self Care | Payer: Medicare Other | Source: Ambulatory Visit | Attending: Neurological Surgery | Admitting: Neurological Surgery

## 2019-10-25 ENCOUNTER — Other Ambulatory Visit: Payer: Self-pay

## 2019-10-25 DIAGNOSIS — M48062 Spinal stenosis, lumbar region with neurogenic claudication: Secondary | ICD-10-CM

## 2020-01-22 ENCOUNTER — Ambulatory Visit: Payer: Medicare Other

## 2020-01-22 ENCOUNTER — Ambulatory Visit: Payer: Medicare Other | Attending: Internal Medicine

## 2020-01-22 DIAGNOSIS — Z23 Encounter for immunization: Secondary | ICD-10-CM | POA: Insufficient documentation

## 2020-01-22 NOTE — Progress Notes (Signed)
   Covid-19 Vaccination Clinic  Name:  Jenna Hogan    MRN: [id number] DOB: 03-07-1941  01/22/2020  [name] was observed post Covid-19 immunization for 15 minutes without incidence. She was provided with Vaccine Information Sheet and instruction to access the V-Safe system.   [name] was instructed to call 911 with any severe reactions post vaccine: Marland Kitchen Difficulty breathing  . Swelling of your face and throat  . A fast heartbeat  . A bad rash all over your body  . Dizziness and weakness    Immunizations Administered    Name Date Dose VIS Date Route   Pfizer COVID-19 Vaccine 01/22/2020 12:09 PM 0.3 mL 12/11/2019 Intramuscular   Manufacturer: [employer], [employer]   Lot: [id number]   NDC: [address]-1

## 2020-02-13 ENCOUNTER — Ambulatory Visit: Payer: Medicare Other

## 2020-02-14 ENCOUNTER — Ambulatory Visit: Payer: Medicare Other | Attending: Internal Medicine

## 2020-02-14 DIAGNOSIS — Z23 Encounter for immunization: Secondary | ICD-10-CM | POA: Insufficient documentation

## 2020-02-14 NOTE — Progress Notes (Signed)
   Covid-19 Vaccination Clinic  Name:  Jenna Hogan    MRN: [id number] DOB: 1941-08-18  02/14/2020  Jenna Hogan was observed post Covid-19 immunization for 15 minutes without incidence. She was provided with Vaccine Information Sheet and instruction to access the V-Safe system.   Jenna Hogan was instructed to call 911 with any severe reactions post vaccine: Marland Kitchen Difficulty breathing  . Swelling of your face and throat  . A fast heartbeat  . A bad rash all over your body  . Dizziness and weakness    Immunizations Administered    Name Date Dose VIS Date Route   Pfizer COVID-19 Vaccine 02/14/2020 11:04 AM 0.3 mL 12/11/2019 Intramuscular   Manufacturer: [employer], [employer]   Lot: [id number]   NDC: [address]-2

## 2020-02-17 ENCOUNTER — Other Ambulatory Visit: Payer: Self-pay

## 2020-02-17 ENCOUNTER — Encounter: Payer: Self-pay | Admitting: Primary Care

## 2020-02-17 ENCOUNTER — Ambulatory Visit (INDEPENDENT_AMBULATORY_CARE_PROVIDER_SITE_OTHER): Payer: Medicare Other | Admitting: Primary Care

## 2020-02-17 VITALS — BP 124/80 | HR 61 | Temp 97.3°F | Ht 62.0 in | Wt 179.5 lb

## 2020-02-17 DIAGNOSIS — E785 Hyperlipidemia, unspecified: Secondary | ICD-10-CM

## 2020-02-17 DIAGNOSIS — G8929 Other chronic pain: Secondary | ICD-10-CM | POA: Insufficient documentation

## 2020-02-17 DIAGNOSIS — M545 Low back pain: Secondary | ICD-10-CM

## 2020-02-17 DIAGNOSIS — M549 Dorsalgia, unspecified: Secondary | ICD-10-CM | POA: Insufficient documentation

## 2020-02-17 DIAGNOSIS — I10 Essential (primary) hypertension: Secondary | ICD-10-CM | POA: Diagnosis not present

## 2020-02-17 DIAGNOSIS — Z23 Encounter for immunization: Secondary | ICD-10-CM

## 2020-02-17 DIAGNOSIS — Z0000 Encounter for general adult medical examination without abnormal findings: Secondary | ICD-10-CM

## 2020-02-17 LAB — CBC
HCT: 40.1 % (ref 36.0–46.0)
Hemoglobin: 13.4 g/dL (ref 12.0–15.0)
MCHC: 33.4 g/dL (ref 30.0–36.0)
MCV: 97.4 fl (ref 78.0–100.0)
Platelets: 215 10*3/uL (ref 150.0–400.0)
RBC: 4.12 Mil/uL (ref 3.87–5.11)
RDW: 13.9 % (ref 11.5–15.5)
WBC: 6.8 10*3/uL (ref 4.0–10.5)

## 2020-02-17 LAB — LIPID PANEL
Cholesterol: 166 mg/dL (ref 0–200)
HDL: 77.2 mg/dL (ref >39.00)
LDL Cholesterol: 76 mg/dL (ref 0–99)
NonHDL: 88.78
Total CHOL/HDL Ratio: 2
Triglycerides: 64 mg/dL (ref 0.0–149.0)
VLDL: 12.8 mg/dL (ref 0.0–40.0)

## 2020-02-17 LAB — COMPREHENSIVE METABOLIC PANEL
ALT: 16 U/L (ref 0–35)
AST: 24 U/L (ref 0–37)
Albumin: 4 g/dL (ref 3.5–5.2)
Alkaline Phosphatase: 84 U/L (ref 39–117)
BUN: 19 mg/dL (ref 6–23)
CO2: 32 mEq/L (ref 19–32)
Calcium: 9.2 mg/dL (ref 8.4–10.5)
Chloride: 101 mEq/L (ref 96–112)
Creatinine, Ser: 0.78 mg/dL (ref 0.40–1.20)
GFR: 70.91 mL/min (ref >60.00)
Glucose, Bld: 101 mg/dL — ABNORMAL HIGH (ref 70–99)
Potassium: 4.3 mEq/L (ref 3.5–5.1)
Sodium: 137 mEq/L (ref 135–145)
Total Bilirubin: 0.5 mg/dL (ref 0.2–1.2)
Total Protein: 6.6 g/dL (ref 6.0–8.3)

## 2020-02-17 MED ORDER — ZOSTER VAC RECOMB ADJUVANTED 50 MCG/0.5ML IM SUSR: 0.5000 mL | Freq: Once | INTRAMUSCULAR | 1 refills | Status: AC

## 2020-02-17 MED ORDER — SIMVASTATIN 40 MG PO TABS: 40.0000 mg | ORAL_TABLET | Freq: Every evening | ORAL | 3 refills | Status: DC

## 2020-02-17 NOTE — Assessment & Plan Note (Signed)
Well controlled on metoprolol 50 mg BID. Continue same.

## 2020-02-17 NOTE — Assessment & Plan Note (Signed)
I have personally reviewed and have noted: 1. The patient's medical and social history 2. Their use of alcohol, tobacco or illicit drugs 3. Their current medications and supplements 4. The patient's functional ability including ADL's, fall risks, home  safety risks and hearing or visual impairment. 5. Diet and physical activities 6. Evidence for depression or mood disorder   Immunizations UTD per patient, we don't have records for last pneumonia vaccination, we have requested this several times. Tetanus UTD. Rx for Shingrix provided. Declines mammogram due to age, colonoscopy UTD and not need repeated. Encouraged a healthy diet, regular exercise. All recommendations provided at end of visit.

## 2020-02-17 NOTE — Patient Instructions (Signed)
Stop by the lab prior to leaving today. I will notify you of your results once received.   It's important to improve your diet by reducing consumption of fast food, fried food, processed snack foods, sugary drinks. Increase consumption of fresh vegetables and fruits, whole grains, water.  Ensure you are drinking 64 ounces of water daily.  Start exercising. You should be getting 150 minutes of exercise weekly.  Take the Shingles vaccine to your pharmacy.  It was a pleasure to see you today!

## 2020-02-17 NOTE — Progress Notes (Signed)
Patient ID: Jenna Hogan, female   DOB: 08-07-1941, 79 y.o.   MRN: [id number]  Ms. Jenna Hogan is a 79 year old female who presents today for MWV.   This visit occurred during the SARS-CoV-2 public health emergency.  Safety protocols were in place, including screening questions prior to the visit, additional usage of staff PPE, and extensive cleaning of exam room while observing appropriate contact time as indicated for disinfecting solutions.   HPI:  Past Medical History:  Diagnosis Date  . Depression   . Essential hypertension   . Fatigue   . Hypercholesteremia   . Urinary incontinence     Current Outpatient Medications  Medication Sig Dispense Refill  . Fexofenadine HCl (ALLEGRA PO) Take 1 tablet by mouth daily as needed.    . metoprolol tartrate (LOPRESSOR) 50 MG tablet Taking 1 tablet in the am and taking 1/2 tablet in the pm    . naproxen sodium (ALEVE) 220 MG tablet Take 220 mg by mouth daily as needed.    . simvastatin (ZOCOR) 40 MG tablet Take 40 mg by mouth daily.     No current facility-administered medications for this visit.    No Known Allergies  Family History  Problem Relation Age of Onset  . Heart disease Mother   . Arthritis Mother     Social History   Socioeconomic History  . Marital status: Married    Spouse name: Not on file  . Number of children: Not on file  . Years of education: Not on file  . Highest education level: Not on file  Occupational History  . Not on file  Tobacco Use  . Smoking status: Never Smoker  . Smokeless tobacco: Never Used  Substance and Sexual Activity  . Alcohol use: Yes    Comment: social  . Drug use: No  . Sexual activity: Not on file  Other Topics Concern  . Not on file  Social History Narrative   Married.   Retired. Once worked in Community education officer.   Enjoys crocheting, spending time with family, traveling.    Social Determinants of Health   Financial Resource Strain:   . Difficulty of Paying Living Expenses: Not  on file  Food Insecurity:   . Worried About Programme researcher, broadcasting/film/video in the Last Year: Not on file  . Ran Out of Food in the Last Year: Not on file  Transportation Needs:   . Lack of Transportation (Medical): Not on file  . Lack of Transportation (Non-Medical): Not on file  Physical Activity:   . Days of Exercise per Week: Not on file  . Minutes of Exercise per Session: Not on file  Stress:   . Feeling of Stress : Not on file  Social Connections:   . Frequency of Communication with Friends and Family: Not on file  . Frequency of Social Gatherings with Friends and Family: Not on file  . Attends Religious Services: Not on file  . Active Member of Clubs or Organizations: Not on file  . Attends Banker Meetings: Not on file  . Marital Status: Not on file  Intimate Partner Violence:   . Fear of Current or Ex-Partner: Not on file  . Emotionally Abused: Not on file  . Physically Abused: Not on file  . Sexually Abused: Not on file    Hospitiliaztions: None   Health Maintenance:    Flu: Completed this season   Tetanus: Completed in 2018  Pneumovax: Completed in 2009  Prevnar: Unsure  Zostavax/Shingrix: Never  completed, Rx printed   Bone Density: No recent exam. Takes vitamin D, no calcium.  Colonoscopy: Completed in 2019  Eye Doctor: Completed in 2019  Dental Exam: No recent exam  Mammogram: No recent mammogram    Providers: Vernona Rieger, PCP   I have personally reviewed and have noted: 1. The patient's medical and social history 2. Their use of alcohol, tobacco or illicit drugs 3. Their current medications and supplements 4. The patient's functional ability including ADL's, fall risks, home  safety risks and hearing or visual impairment. 5. Diet and physical activities 6. Evidence for depression or mood disorder  Subjective:   Review of Systems:   Constitutional: Denies fever, malaise, fatigue, headache or abrupt weight changes.  HEENT: Denies eye pain,  eye redness, ear pain, ringing in the ears, wax buildup, runny nose, nasal congestion, bloody nose, or sore throat. Respiratory: Denies difficulty breathing, shortness of breath, cough or sputum production.   Cardiovascular: Denies chest pain, chest tightness, palpitations or swelling in the hands or feet.  Gastrointestinal: Denies abdominal pain, bloating, constipation, diarrhea or blood in the stool.  GU: Denies urgency, frequency, pain with urination, burning sensation, blood in urine, odor or discharge. Musculoskeletal: Chronic back pain..  Skin: Denies redness, rashes, lesions or ulcercations.  Neurological: Denies dizziness, difficulty with memory, difficulty with speech or problems with balance and coordination.  Psychiatric: Denies concerns for anxiety or depression.   No other specific complaints in a complete review of systems (except as listed in HPI above).  Objective:  PE:   BP 124/80   Pulse 61   Temp (!) 97.3 F (36.3 C) (Temporal)   Ht 5\' 2"  (1.575 m)   Wt 179 lb 8 oz (81.4 kg)   SpO2 98%   BMI 32.83 kg/m  Wt Readings from Last 3 Encounters:  02/17/20 179 lb 8 oz (81.4 kg)  06/23/18 163 lb (73.9 kg)  09/17/17 168 lb (76.2 kg)    General: Appears their stated age, well developed, well nourished in NAD. Skin: Warm, dry and intact. No rashes, lesions or ulcerations noted. HEENT: Head: normal shape and size; Eyes: sclera white, no icterus, conjunctiva pink, PERRLA and EOMs intact; Ears: Tm's gray and intact, normal light reflex; Nose: mucosa pink and moist, septum midline; Throat/Mouth: Teeth present, mucosa pink and moist, no exudate, lesions or ulcerations noted.  Neck: Normal range of motion. Neck supple, trachea midline. No massses, lumps or thyromegaly present.  Cardiovascular: Normal rate and rhythm. S1,S2 noted.  No murmur, rubs or gallops noted. No JVD or BLE edema. No carotid bruits noted. Pulmonary/Chest: Normal effort and positive vesicular breath sounds. No  respiratory distress. No wheezes, rales or ronchi noted.  Abdomen: Soft and nontender. Normal bowel sounds, no bruits noted. No distention or masses noted. Liver, spleen and kidneys non palpable. Musculoskeletal: Normal range of motion. No signs of joint swelling. No difficulty with gait.  Neurological: Alert and oriented. Cranial nerves II-XII intact. Coordination normal. +DTRs bilaterally. Psychiatric: Mood and affect normal. Behavior is normal. Judgment and thought content normal.    BMET    Component Value Date/Time   NA 131 (L) 05/07/2015 2038   K 3.1 (L) 05/07/2015 2038   CL 99 (L) 05/07/2015 2038   CO2 27 05/07/2015 2038   GLUCOSE 103 (H) 05/07/2015 2038   BUN 11 05/07/2015 2038   CREATININE 0.67 05/07/2015 2038   CALCIUM 8.6 (L) 05/07/2015 2038   GFRNONAA >60 05/07/2015 2038   GFRAA >60 05/07/2015 2038  Lipid Panel  No results found for: CHOL, TRIG, HDL, CHOLHDL, VLDL, LDLCALC  CBC    Component Value Date/Time   WBC 6.2 05/07/2015 2038   RBC 3.59 (L) 05/07/2015 2038   HGB 11.5 (L) 05/07/2015 2038   HCT 34.4 (L) 05/07/2015 2038   PLT 184 05/07/2015 2038   MCV 95.8 05/07/2015 2038   MCH 32.0 05/07/2015 2038   MCHC 33.4 05/07/2015 2038   RDW 12.8 05/07/2015 2038   LYMPHSABS 1.1 05/07/2015 2038   MONOABS 0.5 05/07/2015 2038   EOSABS 0.1 05/07/2015 2038   BASOSABS 0.0 05/07/2015 2038    Hgb A1C No results found for: HGBA1C    Assessment and Plan:   Medicare Annual Wellness Visit:  Diet: She endorses a poor diet.  Physical activity: Sedentary Depression/mood screen: Negative Hearing: Intact to whispered voice Visual acuity: Grossly normal, performs annual eye exam  ADLs: Capable Fall risk: None Home safety: Good Cognitive evaluation: Intact to orientation, naming, recall and repetition EOL planning: Adv directives, completed.   Preventative Medicine:  Immunizations UTD per patient, we don't have records for last pneumonia vaccination, we have  requested this several times. Tetanus UTD. Rx for Shingrix provided. Declines mammogram due to age, colonoscopy UTD and not need repeated. Encouraged a healthy diet, regular exercise. All recommendations provided at end of visit.  Next appointment: One year.

## 2020-02-17 NOTE — Assessment & Plan Note (Signed)
Following with neurosurgery, received her last injection in January 2021, due in March 2021.

## 2020-02-17 NOTE — Assessment & Plan Note (Signed)
Compliant to simvastatin, repeat lipid panel pending. 

## 2020-06-15 ENCOUNTER — Other Ambulatory Visit: Payer: Self-pay | Admitting: Primary Care

## 2020-06-15 DIAGNOSIS — I10 Essential (primary) hypertension: Secondary | ICD-10-CM

## 2020-06-15 NOTE — Telephone Encounter (Signed)
Refills sent to pharmacy. 

## 2020-06-15 NOTE — Telephone Encounter (Signed)
Have not prescribe Last OV (cpe/medicare wellness ) with Jenna Hogan on 02/17/2020  No future OV scheduled

## 2020-07-11 ENCOUNTER — Encounter: Payer: Self-pay | Admitting: Family Medicine

## 2020-07-11 ENCOUNTER — Ambulatory Visit (INDEPENDENT_AMBULATORY_CARE_PROVIDER_SITE_OTHER): Payer: Medicare Other | Admitting: Family Medicine

## 2020-07-11 ENCOUNTER — Other Ambulatory Visit: Payer: Self-pay

## 2020-07-11 DIAGNOSIS — H5711 Ocular pain, right eye: Secondary | ICD-10-CM | POA: Diagnosis not present

## 2020-07-11 MED ORDER — ERYTHROMYCIN 5 MG/GM OP OINT: 1.0000 "application " | TOPICAL_OINTMENT | Freq: Three times a day (TID) | OPHTHALMIC | 0 refills | Status: DC

## 2020-07-11 NOTE — Progress Notes (Signed)
Subjective:    Patient ID: Jenna Hogan, female    DOB: 07-Jun-1941, 79 y.o.   MRN: [id number]  This visit occurred during the SARS-CoV-2 public health emergency.  Safety protocols were in place, including screening questions prior to the visit, additional usage of staff PPE, and extensive cleaning of exam room while observing appropriate contact time as indicated for disinfecting solutions.    HPI Pt presents with R eye pain  79 yo pt of NP Clark   Weed eating last tues (? Something may have gotten in eye)  Kept going -not bad  Next day she noticed it more   Used eye drops and hot compresses   Not bad when she closes her eyes  When open - feels gritty  Does not think it is red  No bruising  No swelling of eye or lid  No tearing or d/c from eye   Vision is a little different /squinting /straining  No sens to light   She does have seasonal allergies  Antihistamine -generic    Has had cataract surgery in the past No eye problems  No eye doctor in several years since she moved   Patient Active Problem List   Diagnosis Date Noted  . Acute right eye pain 07/11/2020  . Chronic back pain 02/17/2020  . Medicare annual wellness visit, subsequent 02/17/2020  . Hyperlipidemia 06/03/2017  . Essential hypertension 06/03/2017  . Obesity (BMI 30.0-34.9) 06/03/2017   Past Medical History:  Diagnosis Date  . Depression   . Essential hypertension   . Fatigue   . Hypercholesteremia   . Urinary incontinence    Past Surgical History:  Procedure Laterality Date  . ABDOMINAL HYSTERECTOMY    . CARPAL TUNNEL RELEASE  1998, 2002   x2  . HERNIA REPAIR Bilateral   . MENISCUS REPAIR Left 2011   Social History   Tobacco Use  . Smoking status: Never Smoker  . Smokeless tobacco: Never Used  Substance Use Topics  . Alcohol use: Yes    Comment: social  . Drug use: No   Family History  Problem Relation Age of Onset  . Heart disease Mother   . Arthritis Mother    No Known  Allergies Current Outpatient Medications on File Prior to Visit  Medication Sig Dispense Refill  . Fexofenadine HCl (ALLEGRA PO) Take 1 tablet by mouth daily as needed.    . gabapentin (NEURONTIN) 300 MG capsule Take 300 mg by mouth at bedtime.    . metoprolol tartrate (LOPRESSOR) 50 MG tablet Take 1 tablet (50 mg total) by mouth 2 (two) times daily with a meal. For blood pressure. 180 tablet 2  . naproxen sodium (ALEVE) 220 MG tablet Take 220 mg by mouth daily as needed.    . simvastatin (ZOCOR) 40 MG tablet Take 1 tablet (40 mg total) by mouth every evening. For cholesterol. 90 tablet 3   No current facility-administered medications on file prior to visit.    Review of Systems  Constitutional: Negative for activity change, appetite change, fatigue, fever and unexpected weight change.  HENT: Negative for congestion, ear pain, rhinorrhea, sinus pressure and sore throat.   Eyes: Positive for pain. Negative for photophobia, discharge, redness and visual disturbance.  Respiratory: Negative for cough, shortness of breath and wheezing.   Cardiovascular: Negative for chest pain and palpitations.  Gastrointestinal: Negative for abdominal pain, blood in stool, constipation and diarrhea.  Endocrine: Negative for polydipsia and polyuria.  Genitourinary: Negative for dysuria, frequency and urgency.  Musculoskeletal: Negative for arthralgias, back pain and myalgias.  Skin: Negative for pallor and rash.  Allergic/Immunologic: Negative for environmental allergies.  Neurological: Negative for dizziness, syncope and headaches.  Hematological: Negative for adenopathy. Does not bruise/bleed easily.  Psychiatric/Behavioral: Negative for decreased concentration and dysphoric mood. The patient is not nervous/anxious.        Objective:   Physical Exam Constitutional:      General: She is not in acute distress.    Appearance: Normal appearance. She is not ill-appearing.  HENT:     Head: Normocephalic and  atraumatic.     Nose: Nose normal.     Mouth/Throat:     Mouth: Mucous membranes are moist.     Pharynx: Oropharynx is clear.  Eyes:     General: Lids are normal. Vision grossly intact. Gaze aligned appropriately. No scleral icterus.       Right eye: No foreign body, discharge or hordeolum.        Left eye: No foreign body, discharge or hordeolum.     Extraocular Movements: Extraocular movements intact.     Right eye: Normal extraocular motion.     Left eye: Normal extraocular motion.     Conjunctiva/sclera: Conjunctivae normal.     Right eye: Right conjunctiva is not injected.     Left eye: Left conjunctiva is not injected.     Pupils: Pupils are equal, round, and reactive to light.     Comments: No conj injection  No lid swelling  No obs fb seen   Grossly nl fundi w/o dilation   Cardiovascular:     Rate and Rhythm: Normal rate and regular rhythm.  Pulmonary:     Effort: Pulmonary effort is normal.     Breath sounds: Normal breath sounds.  Musculoskeletal:     Cervical back: Normal range of motion and neck supple.  Lymphadenopathy:     Cervical: No cervical adenopathy.  Skin:    General: Skin is warm and dry.     Findings: No erythema or rash.  Neurological:     Mental Status: She is alert.     Cranial Nerves: No cranial nerve deficit.     Sensory: No sensory deficit.  Psychiatric:        Mood and Affect: Mood normal.           Assessment & Plan:   Problem List Items Addressed This Visit      Other   Acute right eye pain    This began with a feeling of getting something in it during yard work a week ago  No fb seen today (and she is comfortable with eye closed)  Suspect she may have had a conj abrasion  No redness/swelling and vision is grossly nl  Give px for emycin ointment to use tid  Also ref to ophthy for further eval (she does want to get set up with a new practice for regular vision exam as well)  inst to call if symptoms worsen or if redness or  change in vision  Update if not starting to improve in a week or if worsening        Relevant Orders   Ambulatory referral to Ophthalmology

## 2020-07-11 NOTE — Assessment & Plan Note (Signed)
This began with a feeling of getting something in it during yard work a week ago  No fb seen today (and she is comfortable with eye closed)  Suspect she may have had a conj abrasion  No redness/swelling and vision is grossly nl  Give px for emycin ointment to use tid  Also ref to ophthy for further eval (she does want to get set up with a new practice for regular vision exam as well)  inst to call if symptoms worsen or if redness or change in vision  Update if not starting to improve in a week or if worsening

## 2020-07-11 NOTE — Patient Instructions (Addendum)
If eye pain suddenly worsens or you have a vision change let us know   Use the emycin ophty ointment  I placed a referral to opthy -- our office will call you to set that up

## 2021-02-02 ENCOUNTER — Other Ambulatory Visit: Payer: Self-pay | Admitting: Primary Care

## 2021-02-02 DIAGNOSIS — E785 Hyperlipidemia, unspecified: Secondary | ICD-10-CM

## 2021-02-02 NOTE — Telephone Encounter (Signed)
Pharmacy requests refill on: Simvastatin 40 mg   LAST REFILL: 02/17/2020 (Q-90, R-3) LAST OV: 02/17/2020 NEXT OV: Not Scheduled  PHARMACY: [id number] [place], [place]

## 2021-02-02 NOTE — Telephone Encounter (Signed)
Called patient and scheduled visit.

## 2021-02-18 ENCOUNTER — Other Ambulatory Visit: Payer: Self-pay | Admitting: Primary Care

## 2021-02-18 DIAGNOSIS — E785 Hyperlipidemia, unspecified: Secondary | ICD-10-CM

## 2021-03-03 ENCOUNTER — Ambulatory Visit (INDEPENDENT_AMBULATORY_CARE_PROVIDER_SITE_OTHER): Payer: Medicare Other

## 2021-03-03 ENCOUNTER — Encounter: Payer: Self-pay | Admitting: Primary Care

## 2021-03-03 ENCOUNTER — Ambulatory Visit (INDEPENDENT_AMBULATORY_CARE_PROVIDER_SITE_OTHER): Payer: Medicare Other | Admitting: Primary Care

## 2021-03-03 ENCOUNTER — Other Ambulatory Visit: Payer: Self-pay

## 2021-03-03 VITALS — BP 126/82 | HR 62 | Temp 98.6°F | Ht 62.0 in | Wt 157.0 lb

## 2021-03-03 DIAGNOSIS — E785 Hyperlipidemia, unspecified: Secondary | ICD-10-CM | POA: Diagnosis not present

## 2021-03-03 DIAGNOSIS — I10 Essential (primary) hypertension: Secondary | ICD-10-CM | POA: Diagnosis not present

## 2021-03-03 DIAGNOSIS — M545 Low back pain, unspecified: Secondary | ICD-10-CM

## 2021-03-03 DIAGNOSIS — F411 Generalized anxiety disorder: Secondary | ICD-10-CM

## 2021-03-03 DIAGNOSIS — Z0000 Encounter for general adult medical examination without abnormal findings: Secondary | ICD-10-CM

## 2021-03-03 DIAGNOSIS — G8929 Other chronic pain: Secondary | ICD-10-CM

## 2021-03-03 DIAGNOSIS — E2839 Other primary ovarian failure: Secondary | ICD-10-CM

## 2021-03-03 DIAGNOSIS — R32 Unspecified urinary incontinence: Secondary | ICD-10-CM | POA: Diagnosis not present

## 2021-03-03 LAB — COMPREHENSIVE METABOLIC PANEL
ALT: 15 U/L (ref 0–35)
AST: 26 U/L (ref 0–37)
Albumin: 4 g/dL (ref 3.5–5.2)
Alkaline Phosphatase: 74 U/L (ref 39–117)
BUN: 16 mg/dL (ref 6–23)
CO2: 31 mEq/L (ref 19–32)
Calcium: 9.6 mg/dL (ref 8.4–10.5)
Chloride: 100 mEq/L (ref 96–112)
Creatinine, Ser: 0.67 mg/dL (ref 0.40–1.20)
GFR: 81.7 mL/min (ref >60.00)
Glucose, Bld: 97 mg/dL (ref 70–99)
Potassium: 3.9 mEq/L (ref 3.5–5.1)
Sodium: 138 mEq/L (ref 135–145)
Total Bilirubin: 0.7 mg/dL (ref 0.2–1.2)
Total Protein: 7.1 g/dL (ref 6.0–8.3)

## 2021-03-03 LAB — LIPID PANEL
Cholesterol: 161 mg/dL (ref 0–200)
HDL: 76.1 mg/dL (ref >39.00)
LDL Cholesterol: 74 mg/dL (ref 0–99)
NonHDL: 85.33
Total CHOL/HDL Ratio: 2
Triglycerides: 56 mg/dL (ref 0.0–149.0)
VLDL: 11.2 mg/dL (ref 0.0–40.0)

## 2021-03-03 MED ORDER — SERTRALINE HCL 50 MG PO TABS: 50.0000 mg | ORAL_TABLET | Freq: Every day | ORAL | 1 refills | Status: DC

## 2021-03-03 MED ORDER — METOPROLOL TARTRATE 50 MG PO TABS: 50.0000 mg | ORAL_TABLET | Freq: Two times a day (BID) | ORAL | 3 refills | Status: DC

## 2021-03-03 MED ORDER — SIMVASTATIN 40 MG PO TABS: 40.0000 mg | ORAL_TABLET | Freq: Every evening | ORAL | 3 refills | Status: DC

## 2021-03-03 NOTE — Progress Notes (Signed)
PCP notes:  Health Maintenance: Prevnar 13- due Dexa- declined   Abnormal Screenings: none   Patient concerns: Mole on her face  Very bad anxiety while riding in the car    Nurse concerns: none   Next PCP appt.: 03/03/2021 @ 2 pm

## 2021-03-03 NOTE — Assessment & Plan Note (Signed)
Doing well on gabapentin 300 mg HS. Continue same.

## 2021-03-03 NOTE — Patient Instructions (Addendum)
Start sertraline (Zoloft) 50 mg for anxiety.  Start by taking 1/2 tablet by mouth daily for about 1 week, then increase to full tablet thereafter.  Call the breast center to schedule your bone density scan.  Be sure to get regular exercise and eat a healthy diet.  Stop by the lab prior to leaving today. I will notify you of your results once received.   Please schedule a follow up visit for 6 weeks for follow up of anxiety/depression.  It was a pleasure to see you today!

## 2021-03-03 NOTE — Assessment & Plan Note (Signed)
Compliant to simvastatin 40 mg, repeat lipid panel pending.  

## 2021-03-03 NOTE — Assessment & Plan Note (Signed)
Chronic for years, history of hysterectomy in her 30s.  Discussed options for treatment which include pelvic floor physical therapy, home Kegel exercises, medication, surgical intervention.  She currently declines all options and will notify if her symptoms become bothersome.

## 2021-03-03 NOTE — Assessment & Plan Note (Signed)
Well controlled in the office today with a manual reading. Continue metoprolol tartrate 50 mg BID.   Labs pending.

## 2021-03-03 NOTE — Progress Notes (Signed)
Subjective:    Patient ID: Jenna Hogan, female    DOB: [birth year], 80 y.o.   MRN: [id number]  HPI  This visit occurred during the SARS-CoV-2 public health emergency.  Safety protocols were in place, including screening questions prior to the visit, additional usage of staff PPE, and extensive cleaning of exam room while observing appropriate contact time as indicated for disinfecting solutions.   Jenna Hogan is a 80 year old female who presents today for follow-up of chronic conditions.  1) Essential Hypertension: Currently managed on metoprolol tartrate 50 mg twice daily. She denies dizziness, chest pain, headaches.   She does notice light pressure to the left anterior chest for which she's noticed for the last 6+ months, intermittent and believes she's notices this when she feels anxiety. She suffers from chronic anxiety which has increased over the last 6+ months.   She checks her BP at home on occasion which runs 170's/90's.   BP Readings from Last 3 Encounters:  03/03/21 126/82  07/11/20 134/84  02/17/20 124/80     2) Chronic Back Pain: Managed on gabapentin 300 mg HS. Following with orthopedic, receives injections for which have been effective.  3) GAD: Chronic for years, worse over the last year, specifically with panic feeling and anxiety while in the car. Other symptoms include chest pressure, irritability, worry. She was once treated with Prozac for years, worked initially but then became ineffective.   4) Urinary Incontinence: Chronic for years, wears a pad. She's tried Kegal exercises in the past without improvement. Leaks urine slightly throughout the day. History of hysterectomy in her 30's.    GAD 7 : Generalized Anxiety Score 03/03/2021  Nervous, Anxious, on Edge 2  Control/stop worrying 2  Worry too much - different things 3  Trouble relaxing 1  Restless 1  Easily annoyed or irritable 3  Afraid - awful might happen 2  Total GAD 7 Score 14  Anxiety  Difficulty Somewhat difficult      Immunizations: -Tetanus: 2018 -Influenza: Completed this season  -Shingles:   -Pneumonia: 2009 -Covid-19: Completed three vaccines.  Mammogram: No recent exam, declines  Dexa: No recent exam, due Colonoscopy: Declines given age Hep C Screen: Negative   Review of Systems  Constitutional: Negative for unexpected weight change.  HENT: Negative for rhinorrhea.   Eyes: Negative for visual disturbance.  Respiratory: Negative for cough and shortness of breath.   Cardiovascular: Negative for chest pain.  Gastrointestinal: Negative for constipation and diarrhea.  Genitourinary: Negative for difficulty urinating.       Urinary incontinence   Musculoskeletal: Positive for arthralgias, back pain and neck pain.  Allergic/Immunologic: Negative for environmental allergies.  Neurological: Negative for dizziness, numbness and headaches.  Psychiatric/Behavioral: The patient is nervous/anxious.        Past Medical History:  Diagnosis Date  . Depression   . Essential hypertension   . Fatigue   . Hypercholesteremia   . Urinary incontinence      Social History   Socioeconomic History  . Marital status: Married    Spouse name: Not on file  . Number of children: Not on file  . Years of education: Not on file  . Highest education level: Not on file  Occupational History  . Not on file  Tobacco Use  . Smoking status: Never Smoker  . Smokeless tobacco: Never Used  Substance and Sexual Activity  . Alcohol use: Yes    Comment: social  . Drug use: No  . Sexual activity: Not  on file  Other Topics Concern  . Not on file  Social History Narrative   Married.   Retired. Once worked in Community education officer.   Enjoys crocheting, spending time with family, traveling.    Social Determinants of Health   Financial Resource Strain: Low Risk   . Difficulty of Paying Living Expenses: Not hard at all  Food Insecurity: No Food Insecurity  . Worried About Patent examiner in the Last Year: Never true  . Ran Out of Food in the Last Year: Never true  Transportation Needs: No Transportation Needs  . Lack of Transportation (Medical): No  . Lack of Transportation (Non-Medical): No  Physical Activity: Inactive  . Days of Exercise per Week: 0 days  . Minutes of Exercise per Session: 0 min  Stress: Stress Concern Present  . Feeling of Stress : Rather much  Social Connections: Not on file  Intimate Partner Violence: Not At Risk  . Fear of Current or Ex-Partner: No  . Emotionally Abused: No  . Physically Abused: No  . Sexually Abused: No    Past Surgical History:  Procedure Laterality Date  . ABDOMINAL HYSTERECTOMY    . CARPAL TUNNEL RELEASE  1998, 2002   x2  . HERNIA REPAIR Bilateral   . MENISCUS REPAIR Left 2011    Family History  Problem Relation Age of Onset  . Heart disease Mother   . Arthritis Mother     No Known Allergies  Current Outpatient Medications on File Prior to Visit  Medication Sig Dispense Refill  . Fexofenadine HCl (ALLEGRA PO) Take 1 tablet by mouth daily as needed.    . gabapentin (NEURONTIN) 300 MG capsule Take 300 mg by mouth at bedtime.    . metoprolol tartrate (LOPRESSOR) 50 MG tablet Take 1 tablet (50 mg total) by mouth 2 (two) times daily with a meal. For blood pressure. 180 tablet 2  . naproxen sodium (ALEVE) 220 MG tablet Take 220 mg by mouth daily as needed.    . simvastatin (ZOCOR) 40 MG tablet TAKE 1 TABLET (40 MG TOTAL) BY MOUTH EVERY EVENING. FOR CHOLESTEROL. 90 tablet 0  . Calcium Carbonate-Vit D-Min (CALCIUM 1200 PO)     . Cholecalciferol (D3-1000 PO)     . Multiple Vitamins-Minerals (MULTIVITAMIN ADULT EXTRA C PO)      No current facility-administered medications on file prior to visit.    BP 126/82   Pulse 62   Temp 98.6 F (37 C) (Temporal)   Ht 5\' 2"  (1.575 m)   Wt 157 lb (71.2 kg)   SpO2 97%   BMI 28.72 kg/m    Objective:   Physical Exam Constitutional:      Appearance: She is  well-nourished.  HENT:     Right Ear: Tympanic membrane and ear canal normal.     Left Ear: Tympanic membrane and ear canal normal.     Mouth/Throat:     Mouth: Oropharynx is clear and moist.  Eyes:     Extraocular Movements: EOM normal.     Pupils: Pupils are equal, round, and reactive to light.  Cardiovascular:     Rate and Rhythm: Normal rate and regular rhythm.  Pulmonary:     Effort: Pulmonary effort is normal.     Breath sounds: Normal breath sounds.  Abdominal:     General: Bowel sounds are normal.     Palpations: Abdomen is soft.     Tenderness: There is no abdominal tenderness.  Musculoskeletal:  General: Normal range of motion.     Cervical back: Neck supple.  Skin:    General: Skin is warm and dry.  Neurological:     Mental Status: She is alert and oriented to person, place, and time.     Cranial Nerves: No cranial nerve deficit.     Deep Tendon Reflexes:     Reflex Scores:      Patellar reflexes are 2+ on the right side and 2+ on the left side. Psychiatric:        Mood and Affect: Mood and affect normal.     Comments: Appears anxious             Assessment & Plan:

## 2021-03-03 NOTE — Patient Instructions (Signed)
Jenna Hogan , Thank you for taking time to come for your Medicare Wellness Visit. I appreciate your ongoing commitment to your health goals. Please review the following plan we discussed and let me know if I can assist you in the future.   Screening recommendations/referrals: Colonoscopy: no longer required  Mammogram: no longer required  Bone Density: declined Recommended yearly ophthalmology/optometry visit for glaucoma screening and checkup Recommended yearly dental visit for hygiene and checkup  Vaccinations: Influenza vaccine: Up to date, completed 09/30/2020, due 07/2021 Pneumococcal vaccine: due, will get at physical today  Tdap vaccine: Up to date, completed 03/31/2017, due 04/2027 Shingles vaccine: due, check with your insurance regarding coverage if interested    Covid-19:Completed series  Advanced directives: Please bring a copy of your POA (Power of [place]) and/or Living Will to your next appointment.   Conditions/risks identified: hypertension, hyperlipidemia   Next appointment: Follow up in one year for your annual wellness visit    Preventive Care 65 Years and Older, Female Preventive care refers to lifestyle choices and visits with your health care provider that can promote health and wellness. What does preventive care include?  A yearly physical exam. This is also called an annual well check.  Dental exams once or twice a year.  Routine eye exams. Ask your health care provider how often you should have your eyes checked.  Personal lifestyle choices, including:  Daily care of your teeth and gums.  Regular physical activity.  Eating a healthy diet.  Avoiding tobacco and drug use.  Limiting alcohol use.  Practicing safe sex.  Taking low-dose aspirin every day.  Taking vitamin and mineral supplements as recommended by your health care provider. What happens during an annual well check? The services and screenings done by your health care provider during  your annual well check will depend on your age, overall health, lifestyle risk factors, and family history of disease. Counseling  Your health care provider may ask you questions about your:  Alcohol use.  Tobacco use.  Drug use.  Emotional well-being.  Home and relationship well-being.  Sexual activity.  Eating habits.  History of falls.  Memory and ability to understand (cognition).  Work and work Astronomer.  Reproductive health. Screening  You may have the following tests or measurements:  Height, weight, and BMI.  Blood pressure.  Lipid and cholesterol levels. These may be checked every 5 years, or more frequently if you are over 66 years old.  Skin check.  Lung cancer screening. You may have this screening every year starting at age 39 if you have a 30-pack-year history of smoking and currently smoke or have quit within the past 15 years.  Fecal occult blood test (FOBT) of the stool. You may have this test every year starting at age 44.  Flexible sigmoidoscopy or colonoscopy. You may have a sigmoidoscopy every 5 years or a colonoscopy every 10 years starting at age 64.  Hepatitis C blood test.  Hepatitis B blood test.  Sexually transmitted disease (STD) testing.  Diabetes screening. This is done by checking your blood sugar (glucose) after you have not eaten for a while (fasting). You may have this done every 1-3 years.  Bone density scan. This is done to screen for osteoporosis. You may have this done starting at age 73.  Mammogram. This may be done every 1-2 years. Talk to your health care provider about how often you should have regular mammograms. Talk with your health care provider about your test results, treatment  options, and if necessary, the need for more tests. Vaccines  Your health care provider may recommend certain vaccines, such as:  Influenza vaccine. This is recommended every year.  Tetanus, diphtheria, and acellular pertussis (Tdap,  Td) vaccine. You may need a Td booster every 10 years.  Zoster vaccine. You may need this after age 55.  Pneumococcal 13-valent conjugate (PCV13) vaccine. One dose is recommended after age 20.  Pneumococcal polysaccharide (PPSV23) vaccine. One dose is recommended after age 5. Talk to your health care provider about which screenings and vaccines you need and how often you need them. This information is not intended to replace advice given to you by your health care provider. Make sure you discuss any questions you have with your health care provider. Document Released: 01/13/2016 Document Revised: 09/05/2016 Document Reviewed: 10/18/2015 Elsevier Interactive Patient Education  2017 [place] Prevention in the Home Falls can cause injuries. They can happen to people of all ages. There are many things you can do to make your home safe and to help prevent falls. What can I do on the outside of my home?  Regularly fix the edges of walkways and driveways and fix any cracks.  Remove anything that might make you trip as you walk through a door, such as a raised step or threshold.  Trim any bushes or trees on the path to your home.  Use bright outdoor lighting.  Clear any walking paths of anything that might make someone trip, such as rocks or tools.  Regularly check to see if handrails are loose or broken. Make sure that both sides of any steps have handrails.  Any raised decks and porches should have guardrails on the edges.  Have any leaves, snow, or ice cleared regularly.  Use sand or salt on walking paths during winter.  Clean up any spills in your garage right away. This includes oil or grease spills. What can I do in the bathroom?  Use night lights.  Install grab bars by the toilet and in the tub and shower. Do not use towel bars as grab bars.  Use non-skid mats or decals in the tub or shower.  If you need to sit down in the shower, use a plastic, non-slip  stool.  Keep the floor dry. Clean up any water that spills on the floor as soon as it happens.  Remove soap buildup in the tub or shower regularly.  Attach bath mats securely with double-sided non-slip rug tape.  Do not have throw rugs and other things on the floor that can make you trip. What can I do in the bedroom?  Use night lights.  Make sure that you have a light by your bed that is easy to reach.  Do not use any sheets or blankets that are too big for your bed. They should not hang down onto the floor.  Have a firm chair that has side arms. You can use this for support while you get dressed.  Do not have throw rugs and other things on the floor that can make you trip. What can I do in the kitchen?  Clean up any spills right away.  Avoid walking on wet floors.  Keep items that you use a lot in easy-to-reach places.  If you need to reach something above you, use a strong step stool that has a grab bar.  Keep electrical cords out of the way.  Do not use floor polish or wax that makes floors slippery.  If you must use wax, use non-skid floor wax.  Do not have throw rugs and other things on the floor that can make you trip. What can I do with my stairs?  Do not leave any items on the stairs.  Make sure that there are handrails on both sides of the stairs and use them. Fix handrails that are broken or loose. Make sure that handrails are as long as the stairways.  Check any carpeting to make sure that it is firmly attached to the stairs. Fix any carpet that is loose or worn.  Avoid having throw rugs at the top or bottom of the stairs. If you do have throw rugs, attach them to the floor with carpet tape.  Make sure that you have a light switch at the top of the stairs and the bottom of the stairs. If you do not have them, ask someone to add them for you. What else can I do to help prevent falls?  Wear shoes that:  Do not have high heels.  Have rubber bottoms.  Are  comfortable and fit you well.  Are closed at the toe. Do not wear sandals.  If you use a stepladder:  Make sure that it is fully opened. Do not climb a closed stepladder.  Make sure that both sides of the stepladder are locked into place.  Ask someone to hold it for you, if possible.  Clearly mark and make sure that you can see:  Any grab bars or handrails.  First and last steps.  Where the edge of each step is.  Use tools that help you move around (mobility aids) if they are needed. These include:  Canes.  Walkers.  Scooters.  Crutches.  Turn on the lights when you go into a dark area. Replace any light bulbs as soon as they burn out.  Set up your furniture so you have a clear path. Avoid moving your furniture around.  If any of your floors are uneven, fix them.  If there are any pets around you, be aware of where they are.  Review your medicines with your doctor. Some medicines can make you feel dizzy. This can increase your chance of falling. Ask your doctor what other things that you can do to help prevent falls. This information is not intended to replace advice given to you by your health care provider. Make sure you discuss any questions you have with your health care provider. Document Released: 10/13/2009 Document Revised: 05/24/2016 Document Reviewed: 01/21/2015 Elsevier Interactive Patient Education  2017 [employer].

## 2021-03-03 NOTE — Progress Notes (Signed)
Subjective:   Jenna Hogan is a 80 y.o. female who presents for Medicare Annual (Subsequent) preventive examination.  Review of Systems: N/A      I connected with the patient today by telephone and verified that I am speaking with the correct person using two identifiers. Location patient: home Location nurse: work Persons participating in the telephone visit: patient, nurse.   I discussed the limitations, risks, security and privacy concerns of performing an evaluation and management service by telephone and the availability of in person appointments. I also discussed with the patient that there may be a patient responsible charge related to this service. The patient expressed understanding and verbally consented to this telephonic visit.        Cardiac Risk Factors include: advanced age (>[id number], 31>65 women);hypertension;Other (see comment), Risk factor comments: hyperlipidemia     Objective:    Today's Vitals   There is no height or weight on file to calculate BMI.  Advanced Directives 03/03/2021 05/07/2015 05/06/2015  Does Patient Have a Medical Advance Directive? Yes No No  Type of Estate agentAdvance Directive Healthcare Power of [place]Attorney;Living will - -  Copy of Healthcare Power of Attorney in Chart? No - copy requested - -  Would patient like information on creating a medical advance directive? - No - patient declined information No - patient declined information    Current Medications (verified) Outpatient Encounter Medications as of 03/03/2021  Medication Sig  . erythromycin ophthalmic ointment Place 1 application into the right eye 3 (three) times daily.  Marland Kitchen. Fexofenadine HCl (ALLEGRA PO) Take 1 tablet by mouth daily as needed.  . gabapentin (NEURONTIN) 300 MG capsule Take 300 mg by mouth at bedtime.  . metoprolol tartrate (LOPRESSOR) 50 MG tablet Take 1 tablet (50 mg total) by mouth 2 (two) times daily with a meal. For blood pressure.  . naproxen sodium (ALEVE) 220 MG tablet Take  220 mg by mouth daily as needed.  . simvastatin (ZOCOR) 40 MG tablet TAKE 1 TABLET (40 MG TOTAL) BY MOUTH EVERY EVENING. FOR CHOLESTEROL.   No facility-administered encounter medications on file as of 03/03/2021.    Allergies (verified) Patient has no known allergies.   History: Past Medical History:  Diagnosis Date  . Depression   . Essential hypertension   . Fatigue   . Hypercholesteremia   . Urinary incontinence    Past Surgical History:  Procedure Laterality Date  . ABDOMINAL HYSTERECTOMY    . CARPAL TUNNEL RELEASE  1998, 2002   x2  . HERNIA REPAIR Bilateral   . MENISCUS REPAIR Left 2011   Family History  Problem Relation Age of Onset  . Heart disease Mother   . Arthritis Mother    Social History   Socioeconomic History  . Marital status: Married    Spouse name: Not on file  . Number of children: Not on file  . Years of education: Not on file  . Highest education level: Not on file  Occupational History  . Not on file  Tobacco Use  . Smoking status: Never Smoker  . Smokeless tobacco: Never Used  Substance and Sexual Activity  . Alcohol use: Yes    Comment: social  . Drug use: No  . Sexual activity: Not on file  Other Topics Concern  . Not on file  Social History Narrative   Married.   Retired. Once worked in Community education officerinsurance.   Enjoys crocheting, spending time with family, traveling.    Social Determinants of Health   Financial  Resource Strain: Low Risk   . Difficulty of Paying Living Expenses: Not hard at all  Food Insecurity: No Food Insecurity  . Worried About Programme researcher, broadcasting/film/video in the Last Year: Never true  . Ran Out of Food in the Last Year: Never true  Transportation Needs: No Transportation Needs  . Lack of Transportation (Medical): No  . Lack of Transportation (Non-Medical): No  Physical Activity: Inactive  . Days of Exercise per Week: 0 days  . Minutes of Exercise per Session: 0 min  Stress: Stress Concern Present  . Feeling of Stress :  Rather much  Social Connections: Not on file    Tobacco Counseling Counseling given: Not Answered   Clinical Intake:  Pre-visit preparation completed: Yes  Pain : No/denies pain     Nutritional Risks: None Diabetes: No  How often do you need to have someone help you when you read instructions, pamphlets, or other written materials from your doctor or pharmacy?: 1 - Never  Diabetic: No Nutrition Risk Assessment:  Has the patient had any N/V/D within the last 2 months?  No  Does the patient have any non-healing wounds?  No  Has the patient had any unintentional weight loss or weight gain?  No   Diabetes:  Is the patient diabetic?  No  If diabetic, was a CBG obtained today?  N/A Did the patient bring in their glucometer from home?  N/A How often do you monitor your CBG's? N/A.   Financial Strains and Diabetes Management:  Are you having any financial strains with the device, your supplies or your medication? N/A.  Does the patient want to be seen by Chronic Care Management for management of their diabetes?  N/A Would the patient like to be referred to a Nutritionist or for Diabetic Management?  N/A    Interpreter Needed?: No  Information entered by :: CJohnson, LPN   Activities of Daily Living In your present state of health, do you have any difficulty performing the following activities: 03/03/2021  Hearing? N  Vision? N  Difficulty concentrating or making decisions? N  Walking or climbing stairs? N  Dressing or bathing? N  Doing errands, shopping? N  Preparing Food and eating ? N  Using the Toilet? N  In the past six months, have you accidently leaked urine? Y  Comment wears pads daily  Do you have problems with loss of bowel control? N  Managing your Medications? N  Managing your Finances? N  Housekeeping or managing your Housekeeping? N  Some recent data might be hidden    Patient Care Team: Doreene Nest, NP as PCP - General (Internal  Medicine)  Indicate any recent Medical Services you may have received from other than [facility] providers in the past year (date may be approximate).     Assessment:   This is a routine wellness examination for [place].  Hearing/Vision screen  Hearing Screening   125Hz  250Hz  500Hz  1000Hz  2000Hz  3000Hz  4000Hz  6000Hz  8000Hz   Right ear:           Left ear:           Vision Screening Comments: Patient gets annual eye exams   Dietary issues and exercise activities discussed: Current Exercise Habits: The patient does not participate in regular exercise at present, Exercise limited by: None identified  Goals    . Patient Stated     03/03/2021, I will maintain and continue medications as prescribed.       Depression Screen PHQ  2/9 Scores 03/03/2021 02/17/2020  PHQ - 2 Score 0 0  PHQ- 9 Score 0 -    Fall Risk Fall Risk  03/03/2021 02/17/2020  Falls in the past year? 0 0  Number falls in past yr: 0 0  Injury with Fall? 0 0  Risk for fall due to : Medication side effect -  Follow up Falls evaluation completed;Falls prevention discussed -    FALL RISK PREVENTION PERTAINING TO THE HOME:  Any stairs in or around the home? Yes  If so, are there any without handrails? No  Home free of loose throw rugs in walkways, pet beds, electrical cords, etc? Yes  Adequate lighting in your home to reduce risk of falls? Yes   ASSISTIVE DEVICES UTILIZED TO PREVENT FALLS:  Life alert? No  Use of a cane, walker or w/c? No  Grab bars in the bathroom? No  Shower chair or bench in shower? No  Elevated toilet seat or a handicapped toilet? No   TIMED UP AND GO:  Was the test performed? N/A telephone visit .   Cognitive Function: MMSE - Mini Mental State Exam 03/03/2021  Orientation to time 5  Orientation to Place 5  Registration 3  Attention/ Calculation 5  Recall 3  Language- repeat 1       Mini Cog  Mini-Cog screen was completed. Maximum score is 22. A value of 0 denotes this part of the MMSE  was not completed or the patient failed this part of the Mini-Cog screening.  Immunizations Immunization History  Administered Date(s) Administered  . Fluad Quad(high Dose 65+) 09/09/2019  . Influenza Split 10/07/2007, 09/21/2008, 11/01/2009  . Influenza,inj,quad, With Preservative 10/10/2010  . Influenza-Unspecified 09/30/2020  . PFIZER(Purple Top)SARS-COV-2 Vaccination 01/22/2020, 02/14/2020, 10/17/2020  . Pneumococcal Polysaccharide-23 09/21/2008  . Tdap 03/31/2017    TDAP status: Up to date  Flu Vaccine status: Up to date  Pneumococcal vaccine status: Due, Education has been provided regarding the importance of this vaccine. Advised may receive this vaccine at local pharmacy or Health Dept. Aware to provide a copy of the vaccination record if obtained from local pharmacy or Health Dept. Verbalized acceptance and understanding.  Covid-19 vaccine status: Completed vaccines  Qualifies for Shingles Vaccine? Yes   Zostavax completed No   Shingrix Completed?: No.    Education has been provided regarding the importance of this vaccine. Patient has been advised to call insurance company to determine out of pocket expense if they have not yet received this vaccine. Advised may also receive vaccine at local pharmacy or Health Dept. Verbalized acceptance and understanding.  Screening Tests Health Maintenance  Topic Date Due  . PNA vac Low Risk Adult (2 of 2 - PCV13) 09/21/2009  . DEXA SCAN  03/03/2024 (Originally 04/09/2004)  . TETANUS/TDAP  04/01/2027  . INFLUENZA VACCINE  Completed  . COVID-19 Vaccine  Completed  . HPV VACCINES  Aged Out    Health Maintenance  Health Maintenance Due  Topic Date Due  . PNA vac Low Risk Adult (2 of 2 - PCV13) 09/21/2009    Colorectal cancer screening: No longer required.   Mammogram status: No longer required due to age.  Bone Density status declined  Lung Cancer Screening: (Low Dose CT Chest recommended if Age 19-80 years, 30 pack-year  currently smoking OR have quit w/in 15 years.) does not qualify.   Additional Screening:  Hepatitis C Screening: does not qualify; Completed N/A  Vision Screening: Recommended annual ophthalmology exams for early detection of glaucoma and  other disorders of the eye. Is the patient up to date with their annual eye exam?  Yes  Who is the provider or what is the name of the office in which the patient attends annual eye exams? [facility] If pt is not established with a provider, would they like to be referred to a provider to establish care? No .   Dental Screening: Recommended annual dental exams for proper oral hygiene  Community Resource Referral / Chronic Care Management: CRR required this visit?  No   CCM required this visit?  No      Plan:     I have personally reviewed and noted the following in the patient's chart:   . Medical and social history . Use of alcohol, tobacco or illicit drugs  . Current medications and supplements . Functional ability and status . Nutritional status . Physical activity . Advanced directives . List of other physicians . Hospitalizations, surgeries, and ER visits in previous 12 months . Vitals . Screenings to include cognitive, depression, and falls . Referrals and appointments  In addition, I have reviewed and discussed with patient certain preventive protocols, quality metrics, and best practice recommendations. A written personalized care plan for preventive services as well as general preventive health recommendations were provided to patient.   Due to this being a telephonic visit, the after visit summary with patients personalized plan was offered to patient via office or my-chart. Patient preferred to pick up at office at next visit or via mychart.   Janalyn Shy, LPN   01/05/1095

## 2021-03-03 NOTE — Assessment & Plan Note (Signed)
Chronic, uncontrolled. GAD 7 score of 14 today.   Discussed options for treatment, she elects for medication treatment. Rx for Zoloft 50 mg sent to pharmacy.   Patient is to take 1/2 tablet daily for 8 days, then advance to 1 full tablet thereafter. We discussed possible side effects of headache, GI upset, drowsiness, and SI/HI. If thoughts of SI/HI develop, we discussed to present to the emergency immediately. Patient verbalized understanding.   Follow up in 6 weeks for re-evaluation.

## 2021-03-25 ENCOUNTER — Other Ambulatory Visit: Payer: Self-pay | Admitting: Primary Care

## 2021-03-25 DIAGNOSIS — F411 Generalized anxiety disorder: Secondary | ICD-10-CM

## 2021-04-18 ENCOUNTER — Ambulatory Visit: Payer: Medicare Other | Admitting: Primary Care

## 2021-04-22 ENCOUNTER — Other Ambulatory Visit: Payer: Self-pay | Admitting: Primary Care

## 2021-04-22 DIAGNOSIS — F411 Generalized anxiety disorder: Secondary | ICD-10-CM

## 2021-04-26 NOTE — Telephone Encounter (Signed)
Refill request sertraline Last refill 03/03/21 #30/1 Last office visit 03/03/21 Upcoming appointment 04/27/21

## 2021-04-27 ENCOUNTER — Ambulatory Visit (INDEPENDENT_AMBULATORY_CARE_PROVIDER_SITE_OTHER): Payer: Medicare Other | Admitting: Primary Care

## 2021-04-27 ENCOUNTER — Other Ambulatory Visit: Payer: Self-pay

## 2021-04-27 DIAGNOSIS — F411 Generalized anxiety disorder: Secondary | ICD-10-CM | POA: Diagnosis not present

## 2021-04-27 NOTE — Assessment & Plan Note (Signed)
Improved on Zoloft 50 mg without side effects.  Continue Zoloft 50 mg daily for now, she will update. Refills provided.

## 2021-04-27 NOTE — Patient Instructions (Signed)
Continue taking sertraline 50 mg daily for anxiety.  It was a pleasure to see you today!

## 2021-04-27 NOTE — Progress Notes (Signed)
Subjective:    Patient ID: Jenna Hogan, female    DOB: 1941/04/05, 80 y.o.   MRN: [id number]  HPI  Jenna Hogan is a very pleasant 80 y.o. female with a history of hypertension, hyperlipidemia, GAD, chronic back pain who presents today for follow up of anxiety.  She was last evaluated in early March 2022 and endorsed chronic anxiety that had progressed over the last year. Symptoms included panic when riding in the car, worry, chest pressure, irritability. Given symptoms we decided to treat with Zoloft 50 mg. She was asked to return today for follow up.  Since her last visit she's feeling better. Positive effects of Zoloft include reduced anxiety while riding in the car, is relaxing more, is able to handle stressful situations better, some decrease in irritability.  She denies nausea, dizziness, headaches, SI/HI.    Review of Systems  Respiratory: Negative for shortness of breath.   Cardiovascular: Negative for palpitations.  Psychiatric/Behavioral: The patient is nervous/anxious.        See HPI         Past Medical History:  Diagnosis Date  . Depression   . Essential hypertension   . Fatigue   . Hypercholesteremia   . Urinary incontinence     Social History   Socioeconomic History  . Marital status: Married    Spouse name: Not on file  . Number of children: Not on file  . Years of education: Not on file  . Highest education level: Not on file  Occupational History  . Not on file  Tobacco Use  . Smoking status: Never Smoker  . Smokeless tobacco: Never Used  Substance and Sexual Activity  . Alcohol use: Yes    Comment: social  . Drug use: No  . Sexual activity: Not on file  Other Topics Concern  . Not on file  Social History Narrative   Married.   Retired. Once worked in Community education officer.   Enjoys crocheting, spending time with family, traveling.    Social Determinants of Health   Financial Resource Strain: Low Risk   . Difficulty of Paying Living  Expenses: Not hard at all  Food Insecurity: No Food Insecurity  . Worried About Programme researcher, broadcasting/film/video in the Last Year: Never true  . Ran Out of Food in the Last Year: Never true  Transportation Needs: No Transportation Needs  . Lack of Transportation (Medical): No  . Lack of Transportation (Non-Medical): No  Physical Activity: Inactive  . Days of Exercise per Week: 0 days  . Minutes of Exercise per Session: 0 min  Stress: Stress Concern Present  . Feeling of Stress : Rather much  Social Connections: Not on file  Intimate Partner Violence: Not At Risk  . Fear of Current or Ex-Partner: No  . Emotionally Abused: No  . Physically Abused: No  . Sexually Abused: No    Past Surgical History:  Procedure Laterality Date  . ABDOMINAL HYSTERECTOMY    . CARPAL TUNNEL RELEASE  1998, 2002   x2  . HERNIA REPAIR Bilateral   . MENISCUS REPAIR Left 2011    Family History  Problem Relation Age of Onset  . Heart disease Mother   . Arthritis Mother     No Known Allergies  Current Outpatient Medications on File Prior to Visit  Medication Sig Dispense Refill  . Calcium Carbonate-Vit D-Min (CALCIUM 1200 PO)     . Cholecalciferol (D3-1000 PO)     . Fexofenadine HCl (ALLEGRA PO) Take 1 tablet  by mouth daily as needed.    . gabapentin (NEURONTIN) 300 MG capsule Take 300 mg by mouth at bedtime.    . metoprolol tartrate (LOPRESSOR) 50 MG tablet Take 1 tablet (50 mg total) by mouth 2 (two) times daily with a meal. For blood pressure. 180 tablet 3  . Multiple Vitamins-Minerals (MULTIVITAMIN ADULT EXTRA C PO)     . Multiple Vitamins-Minerals (PRESERVISION AREDS 2 PO) Take 2 capsules by mouth daily. Take one in the morning and one at night.    . naproxen sodium (ALEVE) 220 MG tablet Take 220 mg by mouth daily as needed.    . sertraline (ZOLOFT) 50 MG tablet TAKE 1 TABLET BY MOUTH EVERY DAY FOR ANXIETY 90 tablet 3  . simvastatin (ZOCOR) 40 MG tablet Take 1 tablet (40 mg total) by mouth every evening.  For cholesterol. 90 tablet 3   No current facility-administered medications on file prior to visit.    BP 116/78 (BP Location: Left Arm, Patient Position: Sitting, Cuff Size: Small)   Pulse 91   Temp (!) 97.3 F (36.3 C)   Ht 5\' 2"  (1.575 m)   Wt 150 lb 12.8 oz (68.4 kg)   SpO2 98%   BMI 27.58 kg/m  Objective:   Physical Exam Cardiovascular:     Rate and Rhythm: Normal rate and regular rhythm.  Pulmonary:     Effort: Pulmonary effort is normal.     Breath sounds: Normal breath sounds.  Musculoskeletal:     Cervical back: Neck supple.  Skin:    General: Skin is warm and dry.  Psychiatric:        Mood and Affect: Mood normal.           Assessment & Plan:      This visit occurred during the SARS-CoV-2 public health emergency.  Safety protocols were in place, including screening questions prior to the visit, additional usage of staff PPE, and extensive cleaning of exam room while observing appropriate contact time as indicated for disinfecting solutions.

## 2021-10-12 DIAGNOSIS — Z23 Encounter for immunization: Secondary | ICD-10-CM | POA: Diagnosis not present

## 2021-11-08 DIAGNOSIS — Z23 Encounter for immunization: Secondary | ICD-10-CM | POA: Diagnosis not present

## 2021-12-02 DIAGNOSIS — M654 Radial styloid tenosynovitis [de Quervain]: Secondary | ICD-10-CM | POA: Diagnosis not present

## 2022-02-27 ENCOUNTER — Other Ambulatory Visit: Payer: Self-pay | Admitting: Primary Care

## 2022-02-27 DIAGNOSIS — I10 Essential (primary) hypertension: Secondary | ICD-10-CM

## 2022-02-27 NOTE — Telephone Encounter (Signed)
Tried to call and schedule for a f/u with pt but could not reach them.

## 2022-02-27 NOTE — Telephone Encounter (Signed)
Support Pool:  Patient needs office follow up visit with me scheduled for March 2023. Thanks.

## 2022-03-02 NOTE — Progress Notes (Signed)
Subjective:   Jenna Hogan is a 81 y.o. female who presents for Medicare Annual (Subsequent) preventive examination.  I connected with Joycelin Radloff today by telephone and verified that I am speaking with the correct person using two identifiers. Location patient: home Location provider: work Persons participating in the virtual visit: patient, Marine scientist.    I discussed the limitations, risks, security and privacy concerns of performing an evaluation and management service by telephone and the availability of in person appointments. I also discussed with the patient that there may be a patient responsible charge related to this service. The patient expressed understanding and verbally consented to this telephonic visit.    Interactive audio and video telecommunications were attempted between this provider and patient, however failed, due to patient having technical difficulties OR patient did not have access to video capability.  We continued and completed visit with audio only.  Some vital signs may be absent or patient reported.   Time Spent with patient on telephone encounter: 25 minutes  Review of Systems     Cardiac Risk Factors include: advanced age (>81mn, >>33women);hypertension;dyslipidemia     Objective:    Today's Vitals   03/05/22 0944  Weight: 145 lb (65.8 kg)  Height: 5' 2" (1.575 m)   Body mass index is 26.52 kg/m.  Advanced Directives 03/05/2022 03/03/2021 05/07/2015 05/06/2015  Does Patient Have a Medical Advance Directive? Yes Yes No No  Type of [place] will - -  Does patient want to make changes to medical advance directive? Yes (MAU/Ambulatory/Procedural Areas - Information given) - - -  Copy of Healthcare Power of Attorney in Chart? - No - copy requested - -  Would patient like information on creating a medical advance directive? - - No - patient declined information No -  patient declined information    Current Medications (verified) Outpatient Encounter Medications as of 03/05/2022  Medication Sig   Cholecalciferol (D3-1000 PO)    Fexofenadine HCl (ALLEGRA PO) Take 1 tablet by mouth daily as needed.   metoprolol tartrate (LOPRESSOR) 50 MG tablet Take 1 tablet (50 mg total) by mouth 2 (two) times daily. For blood pressure. Office visit required for further refills.   Multiple Vitamins-Minerals (MULTIVITAMIN ADULT EXTRA C PO)    Multiple Vitamins-Minerals (PRESERVISION AREDS 2 PO) Take 2 capsules by mouth daily. Take one in the morning and one at night.   sertraline (ZOLOFT) 50 MG tablet TAKE 1 TABLET BY MOUTH EVERY DAY FOR ANXIETY   simvastatin (ZOCOR) 40 MG tablet Take 1 tablet (40 mg total) by mouth every evening. For cholesterol.   Calcium Carbonate-Vit D-Min (CALCIUM 1200 PO)  (Patient not taking: Reported on 03/05/2022)   gabapentin (NEURONTIN) 300 MG capsule Take 300 mg by mouth at bedtime. (Patient not taking: Reported on 03/05/2022)   naproxen sodium (ALEVE) 220 MG tablet Take 220 mg by mouth daily as needed.   No facility-administered encounter medications on file as of 03/05/2022.    Allergies (verified) Patient has no known allergies.   History: Past Medical History:  Diagnosis Date   Depression    Essential hypertension    Fatigue    Hypercholesteremia    Urinary incontinence    Past Surgical History:  Procedure Laterality Date   ABDOMINAL HYSTERECTOMY     CARPAL TUNNEL RELEASE  1998, 2002   x2   HERNIA REPAIR Bilateral    MENISCUS REPAIR Left 2011   Family History  Problem Relation Age of Onset   Heart disease Mother    Arthritis Mother    Social History   Socioeconomic History   Marital status: Widowed    Spouse name: Not on file   Number of children: Not on file   Years of education: Not on file   Highest education level: Not on file  Occupational History   Not on file  Tobacco Use   Smoking status: Never   Smokeless  tobacco: Never  Substance and Sexual Activity   Alcohol use: Yes    Comment: social   Drug use: No   Sexual activity: Not on file  Other Topics Concern   Not on file  Social History Narrative   Married.   Retired. Once worked in Insurance underwriter.   Enjoys crocheting, spending time with family, traveling.    Social Determinants of Health   Financial Resource Strain: Low Risk    Difficulty of Paying Living Expenses: Not hard at all  Food Insecurity: No Food Insecurity   Worried About Charity fundraiser in the Last Year: Never true   [employer] in the Last Year: Never true  Transportation Needs: No Transportation Needs   Lack of Transportation (Medical): No   Lack of Transportation (Non-Medical): No  Physical Activity: Insufficiently Active   Days of Exercise per Week: 1 day   Minutes of Exercise per Session: 50 min  Stress: Stress Concern Present   Feeling of Stress : To some extent  Social Connections: Engineer, building services of Communication with Friends and Family: More than three times a week   Frequency of Social Gatherings with Friends and Family: More than three times a week   Attends Religious Services: More than 4 times per year   Active Member of [employer] or Organizations: Yes   Attends Music therapist: More than 4 times per year   Marital Status: Married    Tobacco Counseling Counseling given: Not Answered   Clinical Intake:  Pre-visit preparation completed: Yes  Pain : No/denies pain     BMI - recorded: 26.52 Nutritional Status: BMI 25 -29 Overweight Nutritional Risks: Unintentional weight loss (lost husband in May) Diabetes: No  How often do you need to have someone help you when you read instructions, pamphlets, or other written materials from your doctor or pharmacy?: 1 - Never  Diabetic? No  Interpreter Needed?: No  Information entered by :: Orrin Brigham LPN   Activities of Daily Living In your present state of health,  do you have any difficulty performing the following activities: 03/05/2022  Hearing? Y  Comment right ear feels clogged  Vision? N  Difficulty concentrating or making decisions? N  Walking or climbing stairs? N  Dressing or bathing? N  Doing errands, shopping? N  Preparing Food and eating ? N  Using the Toilet? N  In the past six months, have you accidently leaked urine? Y  Comment wears pad  Do you have problems with loss of bowel control? N  Managing your Medications? N  Managing your Finances? N  Housekeeping or managing your Housekeeping? N  Some recent data might be hidden    Patient Care Team: Pleas Koch, NP as PCP - General (Internal Medicine)  Indicate any recent Medical Services you may have received from other than [facility] providers in the past year (date may be approximate).     Assessment:   This is a routine wellness examination for [place].  Hearing/Vision screen  Hearing Screening - Comments:: Right ear feels clogged  Vision Screening - Comments:: Last exam April 2022, My eye doctor  Dietary issues and exercise activities discussed: Current Exercise Habits: Structured exercise class, Type of exercise: Other - see comments (Tai Chi and gardens), Time (Minutes): 45, Frequency (Times/Week): 1, Weekly Exercise (Minutes/Week): 45, Intensity: Mild   Goals Addressed             This Visit's Progress    Patient Stated       Would like to continue current routine, more involved with church since husband passed       Depression Screen PHQ 2/9 Scores 03/05/2022 03/03/2021 02/17/2020  PHQ - 2 Score 1 0 0  PHQ- 9 Score - 0 -    Fall Risk Fall Risk  03/05/2022 03/03/2021 02/17/2020  Falls in the past year? 0 0 0  Number falls in past yr: 0 0 0  Injury with Fall? 0 0 0  Risk for fall due to : No Fall Risks Medication side effect -  Follow up Falls prevention discussed Falls evaluation completed;Falls prevention discussed -    FALL RISK PREVENTION PERTAINING TO  THE HOME:  Any stairs in or around the home? Yes  If so, are there any without handrails? No  Home free of loose throw rugs in walkways, pet beds, electrical cords, etc? Yes  Adequate lighting in your home to reduce risk of falls? Yes   ASSISTIVE DEVICES UTILIZED TO PREVENT FALLS:  Life alert? No  Use of a cane, walker or w/c? No  Grab bars in the bathroom? Yes  Shower chair or bench in shower? No  Elevated toilet seat or a handicapped toilet? No   TIMED UP AND GO:  Was the test performed? No .   Cognitive Function: Normal cognitive status assessed by direct observation by this Nurse Health Advisor. No abnormalities found.   MMSE - Mini Mental State Exam 03/03/2021  Orientation to time 5  Orientation to Place 5  Registration 3  Attention/ Calculation 5  Recall 3  Language- repeat 1        Immunizations Immunization History  Administered Date(s) Administered   Fluad Quad(high Dose 65+) 09/09/2019   Influenza Split 10/07/2007, 09/21/2008, 11/01/2009   Influenza,inj,quad, With Preservative 10/10/2010   Influenza-Unspecified 09/30/2020   PFIZER(Purple Top)SARS-COV-2 Vaccination 01/22/2020, 02/14/2020, 10/17/2020   Pneumococcal Polysaccharide-23 09/21/2008   Tdap 03/31/2017    TDAP status: Up to date  Flu Vaccine status: Up to date  Pneumococcal vaccine status: Up to date  Covid-19 vaccine status: Information provided on how to obtain vaccines.   Qualifies for Shingles Vaccine? Yes   Zostavax completed No   Shingrix Completed?: No.    Education has been provided regarding the importance of this vaccine. Patient has been advised to call insurance company to determine out of pocket expense if they have not yet received this vaccine. Advised may also receive vaccine at local pharmacy or Health Dept. Verbalized acceptance and understanding.  Screening Tests Health Maintenance  Topic Date Due   Zoster Vaccines- Shingrix (1 of 2) Never done   Pneumonia Vaccine 25+  Years old (2 - PCV) 09/21/2009   COVID-19 Vaccine (4 - Booster for Pfizer series) 12/12/2020   DEXA SCAN  03/03/2024 (Originally 04/09/2004)   TETANUS/TDAP  04/01/2027   INFLUENZA VACCINE  Completed   HPV VACCINES  Aged Out    Health Maintenance  Health Maintenance Due  Topic Date Due   Zoster Vaccines- Shingrix (1 of 2)  Never done   Pneumonia Vaccine 51+ Years old (2 - PCV) 09/21/2009   COVID-19 Vaccine (4 - Booster for Pfizer series) 12/12/2020    Colorectal cancer screening: No longer required.   Mammogram status: Ordered 03/05/22. Pt provided with contact info and advised to call to schedule appt.   Bone Density status: Ordered 03/05/22. Pt provided with contact info and advised to call to schedule appt.  Lung Cancer Screening: (Low Dose CT Chest recommended if Age 28-80 years, 30 pack-year currently smoking OR have quit w/in 15years.) does not qualify.    Additional Screening:  Hepatitis C Screening: does not qualify  Vision Screening: Recommended annual ophthalmology exams for early detection of glaucoma and other disorders of the eye. Is the patient up to date with their annual eye exam?  Yes  Who is the provider or what is the name of the office in which the patient attends annual eye exams? My eye doctor   Dental Screening: Recommended annual dental exams for proper oral hygiene  Community Resource Referral / Chronic Care Management: CRR required this visit?  No   CCM required this visit?  No      Plan:     I have personally reviewed and noted the following in the patients chart:   Medical and social history Use of alcohol, tobacco or illicit drugs  Current medications and supplements including opioid prescriptions.  Functional ability and status Nutritional status Physical activity Advanced directives List of other physicians Hospitalizations, surgeries, and ER visits in previous 12 months Vitals Screenings to include cognitive, depression, and  falls Referrals and appointments  In addition, I have reviewed and discussed with patient certain preventive protocols, quality metrics, and best practice recommendations. A written personalized care plan for preventive services as well as general preventive health recommendations were provided to patient.   Due to this being a telephonic visit, the after visit summary with patients personalized plan was offered to patient via mail or my-chart. Patient would like to access on my-chart.   Loma Messing, LPN   01/03/864   Nurse Health Advisor  Nurse Notes: none

## 2022-03-05 ENCOUNTER — Ambulatory Visit (INDEPENDENT_AMBULATORY_CARE_PROVIDER_SITE_OTHER): Payer: Medicare HMO

## 2022-03-05 VITALS — Ht 62.0 in | Wt 145.0 lb

## 2022-03-05 DIAGNOSIS — Z1231 Encounter for screening mammogram for malignant neoplasm of breast: Secondary | ICD-10-CM

## 2022-03-05 DIAGNOSIS — Z780 Asymptomatic menopausal state: Secondary | ICD-10-CM | POA: Diagnosis not present

## 2022-03-05 DIAGNOSIS — Z0000 Encounter for general adult medical examination without abnormal findings: Secondary | ICD-10-CM | POA: Diagnosis not present

## 2022-03-05 NOTE — Patient Instructions (Signed)
Jenna Hogan , Thank you for taking time to complete your Medicare Wellness Visit. I appreciate your ongoing commitment to your health goals. Please review the following plan we discussed and let me know if I can assist you in the future.   Screening recommendations/referrals: Colonoscopy: no longer required  Mammogram: due, ordered today, someone will call to schedule an appointment Bone Density: due, ordered today, someone will call to schedule an appointment Recommended yearly ophthalmology/optometry visit for glaucoma screening and checkup Recommended yearly dental visit for hygiene and checkup  Vaccinations: Influenza vaccine: up to date  Pneumococcal vaccine: up to date  Tdap vaccine: up to date, completed 03/31/17, due 04/01/27 Shingles vaccine: Discuss with pharmacy   Covid-19:newest booster available at your local pharmacy   Advanced directives: Please bring a copy of Living Will and/or Healthcare Power of Attorney for your chart.   Conditions/risks identified: see problem list   Next appointment: Follow up in one year for your annual wellness visit    Preventive Care 65 Years and Older, Female Preventive care refers to lifestyle choices and visits with your health care provider that can promote health and wellness. What does preventive care include? A yearly physical exam. This is also called an annual well check. Dental exams once or twice a year. Routine eye exams. Ask your health care provider how often you should have your eyes checked. Personal lifestyle choices, including: Daily care of your teeth and gums. Regular physical activity. Eating a healthy diet. Avoiding tobacco and drug use. Limiting alcohol use. Practicing safe sex. Taking low-dose aspirin every day. Taking vitamin and mineral supplements as recommended by your health care provider. What happens during an annual well check? The services and screenings done by your health care provider during your  annual well check will depend on your age, overall health, lifestyle risk factors, and family history of disease. Counseling  Your health care provider may ask you questions about your: Alcohol use. Tobacco use. Drug use. Emotional well-being. Home and relationship well-being. Sexual activity. Eating habits. History of falls. Memory and ability to understand (cognition). Work and work Astronomer. Reproductive health. Screening  You may have the following tests or measurements: Height, weight, and BMI. Blood pressure. Lipid and cholesterol levels. These may be checked every 5 years, or more frequently if you are over 56 years old. Skin check. Lung cancer screening. You may have this screening every year starting at age 57 if you have a 30-pack-year history of smoking and currently smoke or have quit within the past 15 years. Fecal occult blood test (FOBT) of the stool. You may have this test every year starting at age 38. Flexible sigmoidoscopy or colonoscopy. You may have a sigmoidoscopy every 5 years or a colonoscopy every 10 years starting at age 77. Hepatitis C blood test. Hepatitis B blood test. Sexually transmitted disease (STD) testing. Diabetes screening. This is done by checking your blood sugar (glucose) after you have not eaten for a while (fasting). You may have this done every 1-3 years. Bone density scan. This is done to screen for osteoporosis. You may have this done starting at age 34. Mammogram. This may be done every 1-2 years. Talk to your health care provider about how often you should have regular mammograms. Talk with your health care provider about your test results, treatment options, and if necessary, the need for more tests. Vaccines  Your health care provider may recommend certain vaccines, such as: Influenza vaccine. This is recommended every year. Tetanus, diphtheria,  and acellular pertussis (Tdap, Td) vaccine. You may need a Td booster every 10  years. Zoster vaccine. You may need this after age 63. Pneumococcal 13-valent conjugate (PCV13) vaccine. One dose is recommended after age 31. Pneumococcal polysaccharide (PPSV23) vaccine. One dose is recommended after age 68. Talk to your health care provider about which screenings and vaccines you need and how often you need them. This information is not intended to replace advice given to you by your health care provider. Make sure you discuss any questions you have with your health care provider. Document Released: 01/13/2016 Document Revised: 09/05/2016 Document Reviewed: 10/18/2015 Elsevier Interactive Patient Education  2017 [place] Prevention in the Home Falls can cause injuries. They can happen to people of all ages. There are many things you can do to make your home safe and to help prevent falls. What can I do on the outside of my home? Regularly fix the edges of walkways and driveways and fix any cracks. Remove anything that might make you trip as you walk through a door, such as a raised step or threshold. Trim any bushes or trees on the path to your home. Use bright outdoor lighting. Clear any walking paths of anything that might make someone trip, such as rocks or tools. Regularly check to see if handrails are loose or broken. Make sure that both sides of any steps have handrails. Any raised decks and porches should have guardrails on the edges. Have any leaves, snow, or ice cleared regularly. Use sand or salt on walking paths during winter. Clean up any spills in your garage right away. This includes oil or grease spills. What can I do in the bathroom? Use night lights. Install grab bars by the toilet and in the tub and shower. Do not use towel bars as grab bars. Use non-skid mats or decals in the tub or shower. If you need to sit down in the shower, use a plastic, non-slip stool. Keep the floor dry. Clean up any water that spills on the floor as soon as it  happens. Remove soap buildup in the tub or shower regularly. Attach bath mats securely with double-sided non-slip rug tape. Do not have throw rugs and other things on the floor that can make you trip. What can I do in the bedroom? Use night lights. Make sure that you have a light by your bed that is easy to reach. Do not use any sheets or blankets that are too big for your bed. They should not hang down onto the floor. Have a firm chair that has side arms. You can use this for support while you get dressed. Do not have throw rugs and other things on the floor that can make you trip. What can I do in the kitchen? Clean up any spills right away. Avoid walking on wet floors. Keep items that you use a lot in easy-to-reach places. If you need to reach something above you, use a strong step stool that has a grab bar. Keep electrical cords out of the way. Do not use floor polish or wax that makes floors slippery. If you must use wax, use non-skid floor wax. Do not have throw rugs and other things on the floor that can make you trip. What can I do with my stairs? Do not leave any items on the stairs. Make sure that there are handrails on both sides of the stairs and use them. Fix handrails that are broken or loose. Make sure  that handrails are as long as the stairways. Check any carpeting to make sure that it is firmly attached to the stairs. Fix any carpet that is loose or worn. Avoid having throw rugs at the top or bottom of the stairs. If you do have throw rugs, attach them to the floor with carpet tape. Make sure that you have a light switch at the top of the stairs and the bottom of the stairs. If you do not have them, ask someone to add them for you. What else can I do to help prevent falls? Wear shoes that: Do not have high heels. Have rubber bottoms. Are comfortable and fit you well. Are closed at the toe. Do not wear sandals. If you use a stepladder: Make sure that it is fully opened.  Do not climb a closed stepladder. Make sure that both sides of the stepladder are locked into place. Ask someone to hold it for you, if possible. Clearly mark and make sure that you can see: Any grab bars or handrails. First and last steps. Where the edge of each step is. Use tools that help you move around (mobility aids) if they are needed. These include: Canes. Walkers. Scooters. Crutches. Turn on the lights when you go into a dark area. Replace any light bulbs as soon as they burn out. Set up your furniture so you have a clear path. Avoid moving your furniture around. If any of your floors are uneven, fix them. If there are any pets around you, be aware of where they are. Review your medicines with your doctor. Some medicines can make you feel dizzy. This can increase your chance of falling. Ask your doctor what other things that you can do to help prevent falls. This information is not intended to replace advice given to you by your health care provider. Make sure you discuss any questions you have with your health care provider. Document Released: 10/13/2009 Document Revised: 05/24/2016 Document Reviewed: 01/21/2015 Elsevier Interactive Patient Education  2017 [employer].

## 2022-03-09 ENCOUNTER — Ambulatory Visit (INDEPENDENT_AMBULATORY_CARE_PROVIDER_SITE_OTHER): Payer: Medicare HMO | Admitting: Primary Care

## 2022-03-09 ENCOUNTER — Other Ambulatory Visit: Payer: Self-pay

## 2022-03-09 ENCOUNTER — Encounter: Payer: Self-pay | Admitting: Primary Care

## 2022-03-09 VITALS — BP 148/78 | HR 65 | Temp 98.6°F | Ht 62.5 in | Wt 143.0 lb

## 2022-03-09 DIAGNOSIS — R3129 Other microscopic hematuria: Secondary | ICD-10-CM

## 2022-03-09 DIAGNOSIS — E785 Hyperlipidemia, unspecified: Secondary | ICD-10-CM | POA: Diagnosis not present

## 2022-03-09 DIAGNOSIS — R739 Hyperglycemia, unspecified: Secondary | ICD-10-CM

## 2022-03-09 DIAGNOSIS — I10 Essential (primary) hypertension: Secondary | ICD-10-CM

## 2022-03-09 DIAGNOSIS — R32 Unspecified urinary incontinence: Secondary | ICD-10-CM | POA: Diagnosis not present

## 2022-03-09 DIAGNOSIS — Z0001 Encounter for general adult medical examination with abnormal findings: Secondary | ICD-10-CM | POA: Diagnosis not present

## 2022-03-09 DIAGNOSIS — F411 Generalized anxiety disorder: Secondary | ICD-10-CM | POA: Diagnosis not present

## 2022-03-09 DIAGNOSIS — R1084 Generalized abdominal pain: Secondary | ICD-10-CM | POA: Diagnosis not present

## 2022-03-09 DIAGNOSIS — H6121 Impacted cerumen, right ear: Secondary | ICD-10-CM | POA: Diagnosis not present

## 2022-03-09 DIAGNOSIS — Z0000 Encounter for general adult medical examination without abnormal findings: Secondary | ICD-10-CM | POA: Insufficient documentation

## 2022-03-09 DIAGNOSIS — G8929 Other chronic pain: Secondary | ICD-10-CM

## 2022-03-09 DIAGNOSIS — H6120 Impacted cerumen, unspecified ear: Secondary | ICD-10-CM | POA: Insufficient documentation

## 2022-03-09 DIAGNOSIS — M545 Low back pain, unspecified: Secondary | ICD-10-CM

## 2022-03-09 HISTORY — DX: Generalized abdominal pain: R10.84

## 2022-03-09 LAB — CBC WITH DIFFERENTIAL/PLATELET
Basophils Absolute: 0 10*3/uL (ref 0.0–0.1)
Basophils Relative: 0.6 % (ref 0.0–3.0)
Eosinophils Absolute: 0 10*3/uL (ref 0.0–0.7)
Eosinophils Relative: 0.5 % (ref 0.0–5.0)
HCT: 37.7 % (ref 36.0–46.0)
Hemoglobin: 12.7 g/dL (ref 12.0–15.0)
Lymphocytes Relative: 19.9 % (ref 12.0–46.0)
Lymphs Abs: 1.2 10*3/uL (ref 0.7–4.0)
MCHC: 33.7 g/dL (ref 30.0–36.0)
MCV: 95.8 fl (ref 78.0–100.0)
Monocytes Absolute: 0.5 10*3/uL (ref 0.1–1.0)
Monocytes Relative: 8.7 % (ref 3.0–12.0)
Neutro Abs: 4.2 10*3/uL (ref 1.4–7.7)
Neutrophils Relative %: 70.3 % (ref 43.0–77.0)
Platelets: 181 10*3/uL (ref 150.0–400.0)
RBC: 3.94 Mil/uL (ref 3.87–5.11)
RDW: 13.4 % (ref 11.5–15.5)
WBC: 6 10*3/uL (ref 4.0–10.5)

## 2022-03-09 LAB — LIPID PANEL
Cholesterol: 160 mg/dL (ref 0–200)
HDL: 79.5 mg/dL (ref >39.00)
LDL Cholesterol: 66 mg/dL (ref 0–99)
NonHDL: 80.35
Total CHOL/HDL Ratio: 2
Triglycerides: 73 mg/dL (ref 0.0–149.0)
VLDL: 14.6 mg/dL (ref 0.0–40.0)

## 2022-03-09 LAB — COMPREHENSIVE METABOLIC PANEL
ALT: 17 U/L (ref 0–35)
AST: 27 U/L (ref 0–37)
Albumin: 4.3 g/dL (ref 3.5–5.2)
Alkaline Phosphatase: 85 U/L (ref 39–117)
BUN: 17 mg/dL (ref 6–23)
CO2: 31 mEq/L (ref 19–32)
Calcium: 9.6 mg/dL (ref 8.4–10.5)
Chloride: 96 mEq/L (ref 96–112)
Creatinine, Ser: 0.63 mg/dL (ref 0.40–1.20)
GFR: 82.33 mL/min (ref >60.00)
Glucose, Bld: 101 mg/dL — ABNORMAL HIGH (ref 70–99)
Potassium: 4.1 mEq/L (ref 3.5–5.1)
Sodium: 134 mEq/L — ABNORMAL LOW (ref 135–145)
Total Bilirubin: 0.6 mg/dL (ref 0.2–1.2)
Total Protein: 7 g/dL (ref 6.0–8.3)

## 2022-03-09 LAB — POC URINALSYSI DIPSTICK (AUTOMATED)
Glucose, UA: NEGATIVE
Leukocytes, UA: NEGATIVE
Nitrite, UA: NEGATIVE
Protein, UA: NEGATIVE
Spec Grav, UA: 1.025 (ref 1.010–1.025)
Urobilinogen, UA: NEGATIVE E.U./dL — AB
pH, UA: 5.5 (ref 5.0–8.0)

## 2022-03-09 LAB — LIPASE: Lipase: 44 U/L (ref 11.0–59.0)

## 2022-03-09 LAB — HEMOGLOBIN A1C: Hgb A1c MFr Bld: 5.8 % (ref 4.6–6.5)

## 2022-03-09 LAB — URINALYSIS, MICROSCOPIC ONLY: WBC, UA: NONE SEEN (ref 0–?)

## 2022-03-09 MED ORDER — OMEPRAZOLE 20 MG PO CPDR
20.0000 mg | DELAYED_RELEASE_CAPSULE | Freq: Two times a day (BID) | ORAL | 0 refills | Status: DC
Start: 2022-03-09 — End: 2022-09-21

## 2022-03-09 NOTE — Patient Instructions (Signed)
Stop by the lab prior to leaving today. I will notify you of your results once received.  ? ?Start omeprazole 20 mg twice daily before a meal for stomach pain.  Do not start this until you return the stool specimen as discussed. ? ?Avoid spicy/fried/fatty foods. ? ?It was a pleasure to see you today! ? ?Preventive Care 90 Years and Older, Female ?Preventive care refers to lifestyle choices and visits with your health care provider that can promote health and wellness. Preventive care visits are also called wellness exams. ?What can I expect for my preventive care visit? ?Counseling ?Your health care provider may ask you questions about your: ?Medical history, including: ?Past medical problems. ?Family medical history. ?Pregnancy and menstrual history. ?History of falls. ?Current health, including: ?Memory and ability to understand (cognition). ?Emotional well-being. ?Home life and relationship well-being. ?Sexual activity and sexual health. ?Lifestyle, including: ?Alcohol, nicotine or tobacco, and drug use. ?Access to firearms. ?Diet, exercise, and sleep habits. ?Work and work Statistician. ?Sunscreen use. ?Safety issues such as seatbelt and bike helmet use. ?Physical exam ?Your health care provider will check your: ?Height and weight. These may be used to calculate your BMI (body mass index). BMI is a measurement that tells if you are at a healthy weight. ?Waist circumference. This measures the distance around your waistline. This measurement also tells if you are at a healthy weight and may help predict your risk of certain diseases, such as type 2 diabetes and high blood pressure. ?Heart rate and blood pressure. ?Body temperature. ?Skin for abnormal spots. ?What immunizations do I need? ?Vaccines are usually given at various ages, according to a schedule. Your health care provider will recommend vaccines for you based on your age, medical history, and lifestyle or other factors, such as travel or where you  work. ?What tests do I need? ?Screening ?Your health care provider may recommend screening tests for certain conditions. This may include: ?Lipid and cholesterol levels. ?Hepatitis C test. ?Hepatitis B test. ?HIV (human immunodeficiency virus) test. ?STI (sexually transmitted infection) testing, if you are at risk. ?Lung cancer screening. ?Colorectal cancer screening. ?Diabetes screening. This is done by checking your blood sugar (glucose) after you have not eaten for a while (fasting). ?Mammogram. Talk with your health care provider about how often you should have regular mammograms. ?BRCA-related cancer screening. This may be done if you have a family history of breast, ovarian, tubal, or peritoneal cancers. ?Bone density scan. This is done to screen for osteoporosis. ?Talk with your health care provider about your test results, treatment options, and if necessary, the need for more tests. ?Follow these instructions at home: ?Eating and drinking ? ?Eat a diet that includes fresh fruits and vegetables, whole grains, lean protein, and low-fat dairy products. Limit your intake of foods with high amounts of sugar, saturated fats, and salt. ?Take vitamin and mineral supplements as recommended by your health care provider. ?Do not drink alcohol if your health care provider tells you not to drink. ?If you drink alcohol: ?Limit how much you have to 0-1 drink a day. ?Know how much alcohol is in your drink. In the [place] one drink equals one 12 oz bottle of beer (355 mL), one 5 oz glass of wine (148 mL), or one 1? oz glass of hard liquor (44 mL). ?Lifestyle ?Brush your teeth every morning and night with fluoride toothpaste. Floss one time each day. ?Exercise for at least 30 minutes 5 or more days each week. ?Do not use any products that  contain nicotine or tobacco. These products include cigarettes, chewing tobacco, and vaping devices, such as e-cigarettes. If you need help quitting, ask your health care provider. ?Do not  use drugs. ?If you are sexually active, practice safe sex. Use a condom or other form of protection in order to prevent STIs. ?Take aspirin only as told by your health care provider. Make sure that you understand how much to take and what form to take. Work with your health care provider to find out whether it is safe and beneficial for you to take aspirin daily. ?Ask your health care provider if you need to take a cholesterol-lowering medicine (statin). ?Find healthy ways to manage stress, such as: ?Meditation, yoga, or listening to music. ?Journaling. ?Talking to a trusted person. ?Spending time with friends and family. ?Minimize exposure to UV radiation to reduce your risk of skin cancer. ?Safety ?Always wear your seat belt while driving or riding in a vehicle. ?Do not drive: ?If you have been drinking alcohol. Do not ride with someone who has been drinking. ?When you are tired or distracted. ?While texting. ?If you have been using any mind-altering substances or drugs. ?Wear a helmet and other protective equipment during sports activities. ?If you have firearms in your house, make sure you follow all gun safety procedures. ?What's next? ?Visit your health care provider once a year for an annual wellness visit. ?Ask your health care provider how often you should have your eyes and teeth checked. ?Stay up to date on all vaccines. ?This information is not intended to replace advice given to you by your health care provider. Make sure you discuss any questions you have with your health care provider. ?Document Revised: 06/14/2021 Document Reviewed: 06/14/2021 ?Elsevier Patient Education ? [place]. ? ?

## 2022-03-09 NOTE — Assessment & Plan Note (Signed)
No use of gabapentin in months. ?Prefers to stay off for now. ? ?Will keep on medication list. ?

## 2022-03-09 NOTE — Assessment & Plan Note (Signed)
Continue simvastatin 40 mg. ?Repeat lipid panel pending.  ?

## 2022-03-09 NOTE — Assessment & Plan Note (Signed)
Stable.  No concerns today. Continue to monitor.  

## 2022-03-09 NOTE — Assessment & Plan Note (Signed)
Chronic for years per patient, she is unaware of any work-up for this ?Never gross hematuria. ? ?UA today with 2+ blood, otherwise negative. ?Urine culture and urine microscopic pending. ? ?Consider CT. ?

## 2022-03-09 NOTE — Assessment & Plan Note (Signed)
Controlled.  Continue sertraline 50 mg daily. 

## 2022-03-09 NOTE — Assessment & Plan Note (Addendum)
Symptoms suggestive of ulcer involvement. ?No colonoscopy on file.  ?No evidence of diverticulosis noted on CT scan from 2016. ? ?Today she appears stable, no distress, negative exam. ? ?Checking labs today including lipase, CBC, H pylori, CMP. ?UA today with 2+ blood. Culture and urine microscopic pending ? ?Start omeprazole 20 mg BID (after delivery of H pylori stool sample). Discussed a bland diet with proper hydration.  ? ?Await results.  ?Consider CT. ?ED precautions provided.  ?

## 2022-03-09 NOTE — Assessment & Plan Note (Signed)
She has not taken BP meds today.  ?Discussed to monitor BP at home.  ? ?Continue metoprolol tartrate 50 mg BID.  ?

## 2022-03-09 NOTE — Addendum Note (Signed)
Addended by: Alvina Chou on: 03/09/2022 08:50 AM ? ? Modules accepted: Orders ? ?

## 2022-03-09 NOTE — Assessment & Plan Note (Signed)
Immunizations UTD per patient, she will check pharmacy for records. ? ?Patient would like to complete a mammogram and bone density scan, both of which are ordered and pending. ? ?Exam today as noted. ?Labs pending. ?

## 2022-03-09 NOTE — Assessment & Plan Note (Signed)
Patient consented to irrigation. ?Right canal irrigated. ?Tolerated well. ? ?TM and canal post irrigation unremarkable. ? ?Discussed future use of Debrox drops. ?

## 2022-03-09 NOTE — Progress Notes (Signed)
? ?Subjective:  ? ? Patient ID: Jenna Hogan, female    DOB: 1941-11-20, 81 y.o.   MRN: [id number] ? ?HPI ? ?Jenna Hogan is a very pleasant 81 y.o. female who presents today for complete physical and follow up of chronic conditions. ? ?She would also like to discuss abdominal pain. Her pain began two weeks ago, had constipation at the time, ate corn in some chili, thinks this may have initiated symptoms. Her pain became worse 5 days ago after eating [place] take out food. She's developed symptoms of generalized abdominal pain with "horrible noises" and a gnawing feeling since. Last night she had disruptive sleep because of her gnawing pain. She got up, drank some milk, and fell back asleep.  ? ?Symptoms occur prior to eating, feels better after eating.  ? ?She denies nausea, vomiting, diarrhea, urinary symptoms, bloody stools, history of diverticulosis. Today she has no pain, she's not had her coffee today. She's been taking Tums and pepto bismol with improvement in pain.  ? ?She's noticed a clogged sensation to her right ear, thinks she may have ear wax buildup.  ? ?Immunizations: ?-Tetanus: 2018 ?-Influenza: Completed the season ?-Covid-19: 3 vaccine ?-Shingles: Unsure, she will check ?-Pneumonia: Pneumovax 23 in 2009 ? ?Diet: Fair diet.  ?Exercise: Regular activity  ? ?Eye exam: Completes annually  ?Dental exam: Completes semi-annually  ? ?Mammogram: Completed years ago ?Colonoscopy: Completed years ago, N/A given age.  ?Dexa: No recent on file, due ? ?BP Readings from Last 3 Encounters:  ?03/09/22 (!) 148/78  ?04/27/21 116/78  ?03/03/21 126/82  ? ?She doesn't check BP at home. She's not had her BP medication today.  ? ? ?Review of Systems  ?Constitutional:  Negative for fever.  ?Eyes:  Negative for visual disturbance.  ?Respiratory:  Negative for shortness of breath.   ?Cardiovascular:  Negative for chest pain.  ?Gastrointestinal:  Positive for abdominal pain. Negative for constipation, diarrhea,  nausea and vomiting.  ?Genitourinary:  Negative for difficulty urinating.  ?Musculoskeletal:  Positive for back pain.  ?Skin:  Negative for rash.  ?Neurological:  Negative for dizziness and headaches.  ?Psychiatric/Behavioral:  The patient is not nervous/anxious.   ? ?   ? ? ?Past Medical History:  ?Diagnosis Date  ? Depression   ? Essential hypertension   ? Fatigue   ? Hypercholesteremia   ? Urinary incontinence   ? ? ?Social History  ? ?Socioeconomic History  ? Marital status: Widowed  ?  Spouse name: Not on file  ? Number of children: Not on file  ? Years of education: Not on file  ? Highest education level: Not on file  ?Occupational History  ? Not on file  ?Tobacco Use  ? Smoking status: Never  ? Smokeless tobacco: Never  ?Substance and Sexual Activity  ? Alcohol use: Yes  ?  Comment: social  ? Drug use: No  ? Sexual activity: Not on file  ?Other Topics Concern  ? Not on file  ?Social History Narrative  ? Married.  ? Retired. Once worked in Insurance underwriter.  ? Enjoys crocheting, spending time with family, traveling.   ? ?Social Determinants of Health  ? ?Financial Resource Strain: Low Risk   ? Difficulty of Paying Living Expenses: Not hard at all  ?Food Insecurity: No Food Insecurity  ? Worried About Charity fundraiser in the Last Year: Never true  ? Ran Out of Food in the Last Year: Never true  ?Transportation Needs: No Transportation Needs  ? Lack of Transportation (  Medical): No  ? Lack of Transportation (Non-Medical): No  ?Physical Activity: Insufficiently Active  ? Days of Exercise per Week: 1 day  ? Minutes of Exercise per Session: 50 min  ?Stress: Stress Concern Present  ? Feeling of Stress : To some extent  ?Social Connections: Socially Integrated  ? Frequency of Communication with Friends and Family: More than three times a week  ? Frequency of Social Gatherings with Friends and Family: More than three times a week  ? Attends Religious Services: More than 4 times per year  ? Active Member of Clubs or  Organizations: Yes  ? Attends Archivist Meetings: More than 4 times per year  ? Marital Status: Married  ?Intimate Partner Violence: Not At Risk  ? Fear of Current or Ex-Partner: No  ? Emotionally Abused: No  ? Physically Abused: No  ? Sexually Abused: No  ? ? ?Past Surgical History:  ?Procedure Laterality Date  ? ABDOMINAL HYSTERECTOMY    ? [place], 2002  ? x2  ? HERNIA REPAIR Bilateral   ? MENISCUS REPAIR Left 2011  ? ? ?Family History  ?Problem Relation Age of Onset  ? Heart disease Mother   ? Arthritis Mother   ? ? ?No Known Allergies ? ?Current Outpatient Medications on File Prior to Visit  ?Medication Sig Dispense Refill  ? Cholecalciferol (D3-1000 PO)     ? Fexofenadine HCl (ALLEGRA PO) Take 1 tablet by mouth daily as needed.    ? metoprolol tartrate (LOPRESSOR) 50 MG tablet Take 1 tablet (50 mg total) by mouth 2 (two) times daily. For blood pressure. Office visit required for further refills. 60 tablet 0  ? Multiple Vitamins-Minerals (MULTIVITAMIN ADULT EXTRA C PO)     ? Multiple Vitamins-Minerals (PRESERVISION AREDS 2 PO) Take 2 capsules by mouth daily. Take one in the morning and one at night.    ? naproxen sodium (ALEVE) 220 MG tablet Take 220 mg by mouth daily as needed.    ? sertraline (ZOLOFT) 50 MG tablet TAKE 1 TABLET BY MOUTH EVERY DAY FOR ANXIETY 90 tablet 3  ? simvastatin (ZOCOR) 40 MG tablet Take 1 tablet (40 mg total) by mouth every evening. For cholesterol. 90 tablet 3  ? gabapentin (NEURONTIN) 300 MG capsule Take 300 mg by mouth at bedtime. (Patient not taking: Reported on 03/05/2022)    ? ?No current facility-administered medications on file prior to visit.  ? ? ?BP (!) 148/78   Pulse 65   Temp 98.6 ?F (37 ?C) (Oral)   Ht 5' 2.5" (1.588 m)   Wt 143 lb (64.9 kg)   SpO2 95%   BMI 25.74 kg/m?  ?Objective:  ? Physical Exam ?Constitutional:   ?   General: She is not in acute distress. ?   Appearance: She is not ill-appearing.  ?HENT:  ?   Right Ear: There is  impacted cerumen.  ?   Left Ear: Tympanic membrane and ear canal normal.  ?   Nose: Nose normal.  ?Eyes:  ?   Conjunctiva/sclera: Conjunctivae normal.  ?   Pupils: Pupils are equal, round, and reactive to light.  ?Neck:  ?   Thyroid: No thyromegaly.  ?Cardiovascular:  ?   Rate and Rhythm: Normal rate and regular rhythm.  ?   Heart sounds: No murmur heard. ?Pulmonary:  ?   Effort: Pulmonary effort is normal.  ?   Breath sounds: Normal breath sounds. No rales.  ?Abdominal:  ?   General: Bowel  sounds are normal.  ?   Palpations: Abdomen is soft.  ?   Tenderness: There is no abdominal tenderness.  ?Musculoskeletal:     ?   General: Normal range of motion.  ?   Cervical back: Neck supple.  ?Lymphadenopathy:  ?   Cervical: No cervical adenopathy.  ?Skin: ?   General: Skin is warm and dry.  ?   Findings: No rash.  ?Neurological:  ?   Mental Status: She is alert and oriented to person, place, and time.  ?   Cranial Nerves: No cranial nerve deficit.  ?   Deep Tendon Reflexes: Reflexes are normal and symmetric.  ?Psychiatric:     ?   Mood and Affect: Mood normal.  ? ? ? ? ? ?   ?Assessment & Plan:  ? ? ? ? ?This visit occurred during the SARS-CoV-2 public health emergency.  Safety protocols were in place, including screening questions prior to the visit, additional usage of staff PPE, and extensive cleaning of exam room while observing appropriate contact time as indicated for disinfecting solutions.  ?

## 2022-03-10 LAB — URINE CULTURE
MICRO NUMBER:: 13115620
Result:: NO GROWTH
SPECIMEN QUALITY:: ADEQUATE

## 2022-03-12 ENCOUNTER — Other Ambulatory Visit: Payer: Medicare HMO

## 2022-03-12 DIAGNOSIS — R1084 Generalized abdominal pain: Secondary | ICD-10-CM | POA: Diagnosis not present

## 2022-03-13 LAB — HELICOBACTER PYLORI  SPECIAL ANTIGEN
MICRO NUMBER:: 13122500
SPECIMEN QUALITY: ADEQUATE

## 2022-03-14 ENCOUNTER — Telehealth: Payer: Self-pay

## 2022-03-14 NOTE — Telephone Encounter (Signed)
Called and lvm for pt call the office regarding labs results. ?

## 2022-03-14 NOTE — Telephone Encounter (Signed)
-----   Message from Doreene Nest, NP sent at 03/13/2022  6:49 PM EDT ----- ?Please call patient: She has not reviewed the message regarding her results that I put on my chart.  Also please notify her that her stool study was negative. ? ?How is she doing since starting the omeprazole medication? ?

## 2022-03-21 ENCOUNTER — Other Ambulatory Visit: Payer: Self-pay | Admitting: Primary Care

## 2022-03-21 DIAGNOSIS — I10 Essential (primary) hypertension: Secondary | ICD-10-CM

## 2022-04-03 ENCOUNTER — Other Ambulatory Visit: Payer: Self-pay | Admitting: Primary Care

## 2022-04-03 DIAGNOSIS — R1084 Generalized abdominal pain: Secondary | ICD-10-CM

## 2022-04-09 NOTE — Telephone Encounter (Signed)
Spoke to pt. She was confused about whether she needs to start omeprazole. I read her the notes again. She has read that it can cause ulcers. I advised her that Jae Dire would not prescribe something that could possibly harm her and that this medication can help with ulcers to reduce acid. ?

## 2022-04-19 ENCOUNTER — Telehealth: Payer: Self-pay | Admitting: Primary Care

## 2022-04-19 DIAGNOSIS — G8929 Other chronic pain: Secondary | ICD-10-CM

## 2022-04-19 MED ORDER — GABAPENTIN 300 MG PO CAPS: 300.0000 mg | ORAL_CAPSULE | Freq: Every day | ORAL | 0 refills | Status: DC

## 2022-04-19 NOTE — Telephone Encounter (Signed)
?  Encourage patient to contact the pharmacy for refills or they can request refills through [facility] ? ?Did the patient contact the pharmacy:  N ?Patient said at her last appt on 3.10.23 that Jae Dire mentioned that she would refill it if needed she just need to call ? ? ?LAST APPOINTMENT DATE:  3.10.23 ? ?NEXT APPOINTMENT DATE: Not scheduled yet ? ?MEDICATION: gabapentin (NEURONTIN) 300 MG capsule ? ?Is the patient out of medication? Y    ? ?If not, how much is left? ? ?Is this a 90 day supply:  ? ?PHARMACY: ?[id number] [place], [place] - [address] Phone:  [phone]  ?Fax:  [phone]  ?  ? ? ?Let patient know to contact pharmacy at the end of the day to make sure medication is ready. ? ?Please notify patient to allow 48-72 hours to process ?  ?

## 2022-04-19 NOTE — Telephone Encounter (Signed)
Refills sent to pharmacy. 

## 2022-04-21 ENCOUNTER — Other Ambulatory Visit: Payer: Self-pay | Admitting: Primary Care

## 2022-04-21 DIAGNOSIS — F411 Generalized anxiety disorder: Secondary | ICD-10-CM

## 2022-04-21 NOTE — Telephone Encounter (Signed)
[facility]. [facility] [address].  ? ?If she is needing a refill temporarily and is in [facility], then okay to authorize 90 day refill with 0 refills. We can send the additional refills to [facility] when she returns.  ? ?If she wants [address] then okay to send 90 day supply with 3 refills.  ? ?I have it pending for [facility] with #90, no refills.  ?

## 2022-04-23 MED ORDER — SERTRALINE HCL 50 MG PO TABS: ORAL_TABLET | ORAL | 3 refills | Status: DC

## 2022-04-23 NOTE — Telephone Encounter (Signed)
Spoke to patient by telephone and was advised that she does not want a refill sent to [facility] in [place]. Patient stated that she was there on vacation but is back home now. Patient stated that she does need a ref[facility] [facility]. ?Will sent refill per Allie Bossier NP's instructions. ?[facility] ?

## 2022-05-02 ENCOUNTER — Ambulatory Visit
Admission: RE | Admit: 2022-05-02 | Discharge: 2022-05-02 | Disposition: A | Discharge status: Home / Self Care | Payer: Medicare HMO | Source: Ambulatory Visit | Attending: Primary Care | Admitting: Primary Care

## 2022-05-02 ENCOUNTER — Other Ambulatory Visit: Payer: Self-pay | Admitting: Primary Care

## 2022-05-02 DIAGNOSIS — Z1231 Encounter for screening mammogram for malignant neoplasm of breast: Secondary | ICD-10-CM

## 2022-05-02 DIAGNOSIS — M85832 Other specified disorders of bone density and structure, left forearm: Secondary | ICD-10-CM | POA: Diagnosis not present

## 2022-05-02 DIAGNOSIS — Z780 Asymptomatic menopausal state: Secondary | ICD-10-CM | POA: Insufficient documentation

## 2022-05-02 DIAGNOSIS — E785 Hyperlipidemia, unspecified: Secondary | ICD-10-CM

## 2022-06-06 DIAGNOSIS — I10 Essential (primary) hypertension: Secondary | ICD-10-CM | POA: Diagnosis not present

## 2022-06-06 DIAGNOSIS — Z0100 Encounter for examination of eyes and vision without abnormal findings: Secondary | ICD-10-CM | POA: Diagnosis not present

## 2022-06-06 DIAGNOSIS — H353190 Nonexudative age-related macular degeneration, unspecified eye, stage unspecified: Secondary | ICD-10-CM | POA: Diagnosis not present

## 2022-06-06 DIAGNOSIS — E7800 Pure hypercholesterolemia, unspecified: Secondary | ICD-10-CM | POA: Diagnosis not present

## 2022-06-06 DIAGNOSIS — H5200 Hypermetropia, unspecified eye: Secondary | ICD-10-CM | POA: Diagnosis not present

## 2022-06-15 ENCOUNTER — Ambulatory Visit (INDEPENDENT_AMBULATORY_CARE_PROVIDER_SITE_OTHER): Payer: Medicare HMO | Admitting: Nurse Practitioner

## 2022-06-15 ENCOUNTER — Encounter: Payer: Self-pay | Admitting: Nurse Practitioner

## 2022-06-15 VITALS — BP 120/80 | HR 54 | Temp 98.2°F | Ht 62.5 in | Wt 147.2 lb

## 2022-06-15 DIAGNOSIS — R350 Frequency of micturition: Secondary | ICD-10-CM | POA: Insufficient documentation

## 2022-06-15 LAB — POCT URINALYSIS DIPSTICK
Bilirubin, UA: NEGATIVE
Blood, UA: POSITIVE
Glucose, UA: NEGATIVE
Ketones, UA: NEGATIVE
Leukocytes, UA: NEGATIVE
Nitrite, UA: NEGATIVE
Protein, UA: NEGATIVE
Spec Grav, UA: 1.025 (ref 1.010–1.025)
Urobilinogen, UA: 0.2 E.U./dL
pH, UA: 6 (ref 5.0–8.0)

## 2022-06-15 LAB — URINALYSIS, MICROSCOPIC ONLY: Bacteria, UA: NONE SEEN

## 2022-06-15 NOTE — Progress Notes (Signed)
Acute Office Visit  Subjective:     Patient ID: Jenna Hogan, female    DOB: 26-May-1941, 81 y.o.   MRN: [id number]  Chief Complaint  Patient presents with   Urinary Tract Infection    Pt c/o UTI with urgency to go x1 week      Patient is in today for Urinary complaints  Symptoms started approx 1 week ago. States no abdominal pain. States that she is having freqency. States she took a test over the counter test that was positive. Hx of uti but a long time ago.  States that she started taking a probiotic and states she feels it is better   Review of Systems  Constitutional:  Negative for chills and fever.  Gastrointestinal:  Negative for abdominal pain, nausea and vomiting.       BM this am that was normal   Genitourinary:  Positive for frequency. Negative for dysuria.        Objective:    BP 120/80   Pulse (!) 54   Temp 98.2 F (36.8 C) (Oral)   Ht 5' 2.5" (1.588 m)   Wt 147 lb 3.2 oz (66.8 kg)   SpO2 98%   BMI 26.49 kg/m    Physical Exam Vitals and nursing note reviewed.  Constitutional:      Appearance: Normal appearance.  Cardiovascular:     Rate and Rhythm: Normal rate and regular rhythm.     Heart sounds: Normal heart sounds.  Pulmonary:     Effort: Pulmonary effort is normal.     Breath sounds: Normal breath sounds.  Abdominal:     General: Bowel sounds are normal. There is no distension.     Palpations: There is no mass.     Tenderness: There is no abdominal tenderness. There is no right CVA tenderness or left CVA tenderness.     Hernia: No hernia is present.  Neurological:     Mental Status: She is alert.     Results for orders placed or performed in visit on 06/15/22  POCT urinalysis dipstick  Result Value Ref Range   Color, UA yellow    Clarity, UA clear    Glucose, UA Negative Negative   Bilirubin, UA Negative    Ketones, UA Negative    Spec Grav, UA 1.025 1.010 - 1.025   Blood, UA positive    pH, UA 6.0 5.0 - 8.0   Protein,  UA Negative Negative   Urobilinogen, UA 0.2 0.2 or 1.0 E.U./dL   Nitrite, UA negative    Leukocytes, UA Negative Negative   Appearance     Odor          Assessment & Plan:   Problem List Items Addressed This Visit       Other   Urinary frequency - Primary    Patient with complaints of urinary frequency that has somewhat improved but not resolved.  Does have a remote history of UTI.  UA in office negative.  Does have a history of having red blood cells present in previous UAs without infection.  We will send off urine culture and urine microscopy to make sure not missing anything.  Did discuss this with patient and she acknowledged plan of care.  Follow-up if no improvement      Relevant Orders   POCT urinalysis dipstick (Completed)   Urine Microscopic   Urine Culture    No orders of the defined types were placed in this encounter.   No follow-ups on  file.  Audria Nine, NP

## 2022-06-15 NOTE — Assessment & Plan Note (Signed)
Patient with complaints of urinary frequency that has somewhat improved but not resolved.  Does have a remote history of UTI.  UA in office negative.  Does have a history of having red blood cells present in previous UAs without infection.  We will send off urine culture and urine microscopy to make sure not missing anything.  Did discuss this with patient and she acknowledged plan of care.  Follow-up if no improvement

## 2022-06-15 NOTE — Patient Instructions (Signed)
Nice to see you today I will be in touch with the urine culture once I have the results Follow up if no improvement

## 2022-06-16 LAB — URINE CULTURE
MICRO NUMBER:: 13535882
SPECIMEN QUALITY:: ADEQUATE

## 2022-07-05 ENCOUNTER — Other Ambulatory Visit: Payer: Self-pay | Admitting: Primary Care

## 2022-07-05 DIAGNOSIS — G8929 Other chronic pain: Secondary | ICD-10-CM

## 2022-09-21 ENCOUNTER — Ambulatory Visit (INDEPENDENT_AMBULATORY_CARE_PROVIDER_SITE_OTHER): Payer: Medicare HMO | Admitting: Primary Care

## 2022-09-21 ENCOUNTER — Encounter: Payer: Self-pay | Admitting: Primary Care

## 2022-09-21 DIAGNOSIS — M79605 Pain in left leg: Secondary | ICD-10-CM | POA: Diagnosis not present

## 2022-09-21 MED ORDER — PREDNISONE 20 MG PO TABS: ORAL_TABLET | ORAL | 0 refills | Status: DC

## 2022-09-21 NOTE — Progress Notes (Signed)
Subjective:    Patient ID: Jenna Hogan, female    DOB: 12-02-1941, 81 y.o.   MRN: [id number]  Leg Pain  Pertinent negatives include no numbness.    Jenna Hogan is a very pleasant 81 y.o. female with a history of hypertension, hyperlipidemia, chronic back pain who presents today to discuss lower extremity pain.  Her pain is located to the left lateral and anterior lower extremity distal to the left hip with radiation down to her toes. She describes her pain as a "toothache". Pain is worse when getting into the car after walking at the grocery store, with household chores. She will have a hard time lifting her left lower extremity at times due to the pain. She's also noticed increased pain with prolonged sitting.  She denies injury/trauma, numbness, back pain, hip pain, bowel/bladder control loss. She initially had pain to her right posterior thigh but this pain has resolved. She has having difficulty walking, feels tired after doing housework. She is requesting a handicap placard. She has been taking Aleve and Tylenol without improvement.   She underwent MRI lumbar spine in October 2020 per orthopedics which showed moderate to severe spinal stenosis throughout. Has undergone several injections with relief of her back.   Review of Systems  Musculoskeletal:  Positive for arthralgias and myalgias. Negative for back pain.  Skin:  Negative for color change.  Neurological:  Negative for numbness.         Past Medical History:  Diagnosis Date   Depression    Essential hypertension    Fatigue    Hypercholesteremia    Urinary incontinence     Social History   Socioeconomic History   Marital status: Widowed    Spouse name: Not on file   Number of children: Not on file   Years of education: Not on file   Highest education level: Not on file  Occupational History   Not on file  Tobacco Use   Smoking status: Never   Smokeless tobacco: Never  Substance and Sexual  Activity   Alcohol use: Yes    Comment: social   Drug use: No   Sexual activity: Not on file  Other Topics Concern   Not on file  Social History Narrative   Married.   Retired. Once worked in Community education officer.   Enjoys crocheting, spending time with family, traveling.    Social Determinants of Health   Financial Resource Strain: Low Risk  (03/05/2022)   Overall Financial Resource Strain (CARDIA)    Difficulty of Paying Living Expenses: Not hard at all  Food Insecurity: No Food Insecurity (03/05/2022)   Hunger Vital Sign    Worried About Running Out of Food in the Last Year: Never true    Ran Out of Food in the Last Year: Never true  Transportation Needs: No Transportation Needs (03/05/2022)   PRAPARE - Administrator, Civil Service (Medical): No    Lack of Transportation (Non-Medical): No  Physical Activity: Insufficiently Active (03/05/2022)   Exercise Vital Sign    Days of Exercise per Week: 1 day    Minutes of Exercise per Session: 50 min  Stress: Stress Concern Present (03/05/2022)   [employer] of Occupational Health - Occupational Stress Questionnaire    Feeling of Stress : To some extent  Social Connections: Socially Integrated (03/05/2022)   Social Connection and Isolation Panel [NHANES]    Frequency of Communication with Friends and Family: More than three times a week    Frequency  of Social Gatherings with Friends and Family: More than three times a week    Attends Religious Services: More than 4 times per year    Active Member of [employer] or Organizations: Yes    Attends Music therapist: More than 4 times per year    Marital Status: Married  Human resources officer Violence: Not At Risk (03/05/2022)   Humiliation, Afraid, Rape, and Kick questionnaire    Fear of Current or Ex-Partner: No    Emotionally Abused: No    Physically Abused: No    Sexually Abused: No    Past Surgical History:  Procedure Laterality Date   ABDOMINAL HYSTERECTOMY     [place], 2002   x2   HERNIA REPAIR Bilateral    MENISCUS REPAIR Left 2011    Family History  Problem Relation Age of Onset   Heart disease Mother    Arthritis Mother    Breast cancer Neg Hx     No Known Allergies  Current Outpatient Medications on File Prior to Visit  Medication Sig Dispense Refill   Cholecalciferol (D3-1000 PO)      Fexofenadine HCl (ALLEGRA PO) Take 1 tablet by mouth daily as needed.     gabapentin (NEURONTIN) 300 MG capsule TAKE 1 CAPSULE (300 MG TOTAL) BY MOUTH AT BEDTIME. AS NEEDED FOR BACK PAIN. 90 capsule 1   metoprolol tartrate (LOPRESSOR) 50 MG tablet Take 1 tablet (50 mg total) by mouth 2 (two) times daily. for blood pressure. 180 tablet 3   Multiple Vitamins-Minerals (MULTIVITAMIN ADULT EXTRA C PO)      Multiple Vitamins-Minerals (PRESERVISION AREDS 2 PO) Take 2 capsules by mouth daily. Take one in the morning and one at night.     naproxen sodium (ALEVE) 220 MG tablet Take 220 mg by mouth daily as needed.     sertraline (ZOLOFT) 50 MG tablet TAKE 1 TABLET BY MOUTH EVERY DAY FOR ANXIETY 90 tablet 3   simvastatin (ZOCOR) 40 MG tablet TAKE 1 TABLET (40 MG TOTAL) BY MOUTH EVERY EVENING. FOR CHOLESTEROL. 90 tablet 2   No current facility-administered medications on file prior to visit.    BP 138/72   Pulse (!) 57   Temp 97.9 F (36.6 C) (Temporal)   Ht 5' 2.5" (1.588 m)   Wt 152 lb (68.9 kg)   SpO2 98%   BMI 27.36 kg/m  Objective:   Physical Exam Constitutional:      General: She is not in acute distress. Musculoskeletal:       Legs:     Comments: 5/5 strength to bilateral lower extremities. Decrease in ROM with pain with left leg extension.  Ambulates with limp to left leg without assistance.   Skin:    General: Skin is warm and dry.           Assessment & Plan:   Problem List Items Addressed This Visit       Other   Lower extremity pain, anterior, left    Likely arthritic, especially given results from her MRI lumbar  spine. Reviewed MRI lumbar spine from 2020.  Rx for prednisone course sent to pharmacy. Referral placed for physical therapy. Consider referral back to orthopedics if warranted.  She will update.        Relevant Medications   predniSONE (DELTASONE) 20 MG tablet   Other Relevant Orders   Ambulatory referral to Physical Therapy       Pleas Koch, NP

## 2022-09-21 NOTE — Assessment & Plan Note (Signed)
Likely arthritic, especially given results from her MRI lumbar spine. Reviewed MRI lumbar spine from 2020.  Rx for prednisone course sent to pharmacy. Referral placed for physical therapy. Consider referral back to orthopedics if warranted.  She will update.

## 2022-09-21 NOTE — Patient Instructions (Signed)
Start prednisone for your leg pain:  Take 3 tablets by mouth in the morning for three days, then 2 tablets for three days, then 1 tablet for three days.  You will be contacted regarding your referral to physical therapy.  Please let us know if you have not been contacted within two weeks.   It was a pleasure to see you today!

## 2022-09-25 ENCOUNTER — Telehealth: Payer: Self-pay | Admitting: Primary Care

## 2022-09-25 NOTE — Telephone Encounter (Signed)
Patient called PT office to set up appointment and the office doesn't have any record of the referral from [place]. Please resend.

## 2022-09-25 NOTE — Telephone Encounter (Signed)
Patient called and stated she has not had her referral sent in for therapy. She also won't be able to be seen until Oct. Call back number [phone].

## 2022-10-03 ENCOUNTER — Telehealth: Payer: Self-pay | Admitting: Primary Care

## 2022-10-03 DIAGNOSIS — M79605 Pain in left leg: Secondary | ICD-10-CM

## 2022-10-03 NOTE — Telephone Encounter (Signed)
[facility] PT Andree Elk, DPT) to see what is going on with this referral. Spoke with Lovena Le They do not have the referral. I verified fax number is correct and the Referral and OV notes faxed again to their office.   Referral faxed to Fax: [phone]  Pt has been made aware that this has been faxed again and to give it a few days before she calls to schedule.  Patient is scheduled for 10/18/2022 and they are just waiting on the referral/notes at this point

## 2022-10-03 NOTE — Telephone Encounter (Signed)
Pt called stating she is starting therapy on the 19th of oct. Pt stated the leg pain she's been having been is still bothering her. The steroid did work before but not its not. Pt wants advice on what to do about pain? Call back # [phone]

## 2022-10-04 MED ORDER — PREDNISONE 20 MG PO TABS: ORAL_TABLET | ORAL | 0 refills | Status: DC

## 2022-10-04 NOTE — Telephone Encounter (Signed)
Patient has PT scheduled 10/18/22. She is still having trouble with her leg. The steroid helped tremendously and she states after 3-4 days of taking it she was able to move her leg and walk with minimal discomfort. The pain in her leg is slowly coming back, but is not to the level that it was when she was seen in office. Patient is concerned that the pain is going to get severe again and wants to know if she should just hold out till she sees PT in a couple weeks, or try something else. Also, she's concerned about the pain while in PT. Please advise.

## 2022-10-04 NOTE — Telephone Encounter (Signed)
We can provide another round of prednisone, but shorter duration. Perhaps this round will further reduce her symptoms so that she will be ready for PT. I do believe that she should proceed with PT as scheduled.  Start prednisone 20 mg tablets. Take 2 tablets by mouth once daily in the morning for 5 days.  I will send this to her pharmacy now.

## 2022-10-05 NOTE — Telephone Encounter (Signed)
Notified patient. She will start new round of prednisone and continue with PT as scheduled.

## 2022-10-18 DIAGNOSIS — M79605 Pain in left leg: Secondary | ICD-10-CM | POA: Diagnosis not present

## 2022-10-25 DIAGNOSIS — M79605 Pain in left leg: Secondary | ICD-10-CM | POA: Diagnosis not present

## 2022-11-01 DIAGNOSIS — R531 Weakness: Secondary | ICD-10-CM | POA: Diagnosis not present

## 2022-11-01 DIAGNOSIS — M79605 Pain in left leg: Secondary | ICD-10-CM | POA: Diagnosis not present

## 2022-11-01 DIAGNOSIS — R262 Difficulty in walking, not elsewhere classified: Secondary | ICD-10-CM | POA: Diagnosis not present

## 2022-11-08 ENCOUNTER — Telehealth: Payer: Self-pay | Admitting: Primary Care

## 2022-11-08 NOTE — Telephone Encounter (Signed)
Patient states she has been doing PT for her leg pain and that it seemed to be helping, she is not having the same pain there as she was previously. Now she states when she walks her knees bother her. She has been taking aleve daily for her knees and using topical diclofenac on 1-2 times daily and this seems to help a lot. She is wanting to be evaluated for arthritis in knees.  She was agreeable to seeing Dr. Patsy Lager, scheduled earliest appointment 11/13 @11 :20.

## 2022-11-08 NOTE — Telephone Encounter (Signed)
Please call patient:  Find out what's up with her knees. Let her know that I'm out of the office but offer to set her up with Dr. Patsy Lager for evaluation of her knees. Set up appointment if she's willing.

## 2022-11-08 NOTE — Telephone Encounter (Signed)
Patient called in and would like to have a x-ray done on her knees. Informed patient an order has to be put in. Please advise. Thank you!

## 2022-11-08 NOTE — Telephone Encounter (Signed)
Would you like patient to come in for follow up regarding knee pain?  Per chart review looks like she has had 4 physical therapy visits, including one from today.

## 2022-11-08 NOTE — Telephone Encounter (Signed)
Noted  

## 2022-11-11 NOTE — Progress Notes (Unsigned)
    Jenna Liwanag T. Mozel Burdett, MD, CAQ Sports Medicine [facility] at [facility] [address] [place], [address]  Phone: [phone]  FAX: [phone]  Jenna Hogan - 81 y.o. female  MRN [id number]  Date of Birth: 1941/04/19  Date: 11/12/2022  PCP: Doreene Nest, NP  Referral: Doreene Nest, NP  No chief complaint on file.  Subjective:   Jenna Hogan is a 81 y.o. very pleasant female patient with There is no height or weight on file to calculate BMI. who presents with the following:  Pleasant 81 year old patient presents with some chronic knee pain.  I do not have any recent knee films.    Review of Systems is noted in the HPI, as appropriate  Objective:   There were no vitals taken for this visit.  GEN: No acute distress; alert,appropriate. PULM: Breathing comfortably in no respiratory distress PSYCH: Normally interactive.   Laboratory and Imaging Data:  Assessment and Plan:   ***

## 2022-11-12 ENCOUNTER — Ambulatory Visit
Admission: RE | Admit: 2022-11-12 | Discharge: 2022-11-12 | Disposition: A | Discharge status: Home / Self Care | Payer: Medicare HMO | Source: Ambulatory Visit | Attending: Family Medicine | Admitting: Family Medicine

## 2022-11-12 ENCOUNTER — Ambulatory Visit (INDEPENDENT_AMBULATORY_CARE_PROVIDER_SITE_OTHER): Payer: Medicare HMO | Admitting: Family Medicine

## 2022-11-12 ENCOUNTER — Ambulatory Visit (INDEPENDENT_AMBULATORY_CARE_PROVIDER_SITE_OTHER)
Admission: RE | Admit: 2022-11-12 | Discharge: 2022-11-12 | Disposition: A | Discharge status: Home / Self Care | Payer: Medicare HMO | Source: Ambulatory Visit | Attending: Family Medicine | Admitting: Family Medicine

## 2022-11-12 ENCOUNTER — Encounter: Payer: Self-pay | Admitting: Family Medicine

## 2022-11-12 VITALS — BP 160/70 | HR 73 | Temp 98.2°F | Ht 62.5 in | Wt 159.1 lb

## 2022-11-12 DIAGNOSIS — M1712 Unilateral primary osteoarthritis, left knee: Secondary | ICD-10-CM | POA: Diagnosis not present

## 2022-11-12 DIAGNOSIS — M25561 Pain in right knee: Secondary | ICD-10-CM

## 2022-11-12 DIAGNOSIS — M25562 Pain in left knee: Secondary | ICD-10-CM

## 2022-11-12 DIAGNOSIS — M170 Bilateral primary osteoarthritis of knee: Secondary | ICD-10-CM

## 2022-11-12 DIAGNOSIS — G8929 Other chronic pain: Secondary | ICD-10-CM

## 2022-11-12 DIAGNOSIS — M1711 Unilateral primary osteoarthritis, right knee: Secondary | ICD-10-CM | POA: Diagnosis not present

## 2022-11-12 MED ORDER — TRIAMCINOLONE ACETONIDE 40 MG/ML IJ SUSP
40.0000 mg | Freq: Once | INTRAMUSCULAR | Status: AC
  Administered 2022-11-12: 40 mg via INTRA_ARTICULAR

## 2023-01-14 ENCOUNTER — Ambulatory Visit (INDEPENDENT_AMBULATORY_CARE_PROVIDER_SITE_OTHER): Payer: Medicare HMO | Admitting: Family Medicine

## 2023-01-14 ENCOUNTER — Encounter: Payer: Self-pay | Admitting: Family Medicine

## 2023-01-14 VITALS — BP 146/86 | HR 70 | Temp 97.5°F | Ht 62.5 in | Wt 158.0 lb

## 2023-01-14 DIAGNOSIS — U071 COVID-19: Secondary | ICD-10-CM | POA: Diagnosis not present

## 2023-01-14 DIAGNOSIS — J069 Acute upper respiratory infection, unspecified: Secondary | ICD-10-CM

## 2023-01-14 DIAGNOSIS — R058 Other specified cough: Secondary | ICD-10-CM | POA: Insufficient documentation

## 2023-01-14 DIAGNOSIS — R051 Acute cough: Secondary | ICD-10-CM | POA: Insufficient documentation

## 2023-01-14 LAB — POC COVID19 BINAXNOW: SARS Coronavirus 2 Ag: POSITIVE — AB

## 2023-01-14 MED ORDER — NIRMATRELVIR/RITONAVIR (PAXLOVID)TABLET: 3.0000 | ORAL_TABLET | Freq: Two times a day (BID) | ORAL | 0 refills | Status: AC

## 2023-01-14 MED ORDER — BENZONATATE 200 MG PO CAPS: 200.0000 mg | ORAL_CAPSULE | Freq: Three times a day (TID) | ORAL | 1 refills | Status: DC | PRN

## 2023-01-14 NOTE — Assessment & Plan Note (Signed)
Day 2-3  Given age and hx disc antiviral tx, px paxlovid and disc poss side eff Inst to hold her simvastatin while taking it  Symptom care- mucinex DM and tessalon prn Watch for wheeze and /or sob  ER precautions noted Call back parameters disc  Update if not starting to improve in a week or if worsening  Rev isolation (5 d min or until symptoms resolve) Then mask for 10 addnl days

## 2023-01-14 NOTE — Progress Notes (Signed)
Subjective:    Patient ID: Jenna Hogan, female    DOB: Jan 26, 1941, 82 y.o.   MRN: [id number]  HPI 82 yo pt of NP Clark presents with uri symptoms   Wt Readings from Last 3 Encounters:  01/14/23 158 lb (71.7 kg)  11/12/22 159 lb 2 oz (72.2 kg)  09/21/22 152 lb (68.9 kg)   28.44 kg/m  Vitals:   01/14/23 1428  BP: (!) 146/86  Pulse: 70  Temp: (!) 97.5 F (36.4 C)  TempSrc: Temporal  SpO2: 95%  Weight: 158 lb (71.7 kg)  Height: 5' 2.5" (1.588 m)    Pt had uri symptoms first before xmas and they resolved Never went to the doctor  Cold symptoms and st  Never had a fever  Covid test was negative   This started 2 d ago and may be new  In chest more  Cough is prod of clear mucous (was colored before but not now)  Headache comes and goes - in forehead  No fever  No body aches or chills  No nasal congestion  Has a runny nose however -  clear  Sneezing also since yesterday  Tired   Ears -fine Throat- not sore    No wheezing  No sob   Results for orders placed or performed in visit on 01/14/23  POC COVID-19 [facility]  Result Value Ref Range   SARS Coronavirus 2 Ag Positive (A) Negative      Otc  Mucinex dm   Lab Results  Component Value Date   CREATININE 0.63 03/09/2022   BUN 17 03/09/2022   NA 134 (L) 03/09/2022   K 4.1 03/09/2022   CL 96 03/09/2022   CO2 31 03/09/2022   Gfr 82.3    Patient Active Problem List   Diagnosis Date Noted   COVID-19 01/14/2023   Lower extremity pain, anterior, left 09/21/2022   Urinary frequency 06/15/2022   Encounter for annual general medical examination with abnormal findings in adult 03/09/2022   Generalized abdominal pain 03/09/2022   Cerumen impaction 03/09/2022   Microscopic hematuria 03/09/2022   GAD (generalized anxiety disorder) 03/03/2021   Urinary incontinence 03/03/2021   Acute right eye pain 07/11/2020   Chronic back pain 02/17/2020   Medicare annual wellness visit, subsequent 02/17/2020    Hyperlipidemia 06/03/2017   Essential hypertension 06/03/2017   Obesity (BMI 30.0-34.9) 06/03/2017   Past Medical History:  Diagnosis Date   Depression    Essential hypertension    Fatigue    Hypercholesteremia    Urinary incontinence    Past Surgical History:  Procedure Laterality Date   ABDOMINAL HYSTERECTOMY     CARPAL TUNNEL RELEASE  1998, 2002   x2   HERNIA REPAIR Bilateral    MENISCUS REPAIR Left 2011   Social History   Tobacco Use   Smoking status: Never   Smokeless tobacco: Never  Substance Use Topics   Alcohol use: Yes    Comment: social   Drug use: No   Family History  Problem Relation Age of Onset   Heart disease Mother    Arthritis Mother    Breast cancer Neg Hx    No Known Allergies Current Outpatient Medications on File Prior to Visit  Medication Sig Dispense Refill   Cholecalciferol (D3-1000 PO)      diclofenac Sodium (VOLTAREN) 1 % GEL Apply 2 g topically 4 (four) times daily as needed.     Fexofenadine HCl (ALLEGRA PO) Take 1 tablet by mouth daily as needed.  metoprolol tartrate (LOPRESSOR) 50 MG tablet Take 1 tablet (50 mg total) by mouth 2 (two) times daily. for blood pressure. 180 tablet 3   Multiple Vitamins-Minerals (MULTIVITAMIN ADULT EXTRA C PO)      Multiple Vitamins-Minerals (PRESERVISION AREDS 2 PO) Take 2 capsules by mouth daily. Take one in the morning and one at night.     naproxen sodium (ALEVE) 220 MG tablet Take 440 mg by mouth daily as needed.     sertraline (ZOLOFT) 50 MG tablet TAKE 1 TABLET BY MOUTH EVERY DAY FOR ANXIETY 90 tablet 3   simvastatin (ZOCOR) 40 MG tablet TAKE 1 TABLET (40 MG TOTAL) BY MOUTH EVERY EVENING. FOR CHOLESTEROL. 90 tablet 2   No current facility-administered medications on file prior to visit.      Patient Active Problem List   Diagnosis Date Noted   Post-viral cough syndrome 01/14/2023   Viral URI with cough 01/14/2023   Lower extremity pain, anterior, left 09/21/2022   Urinary frequency  06/15/2022   Encounter for annual general medical examination with abnormal findings in adult 03/09/2022   Generalized abdominal pain 03/09/2022   Cerumen impaction 03/09/2022   Microscopic hematuria 03/09/2022   GAD (generalized anxiety disorder) 03/03/2021   Urinary incontinence 03/03/2021   Acute right eye pain 07/11/2020   Chronic back pain 02/17/2020   Medicare annual wellness visit, subsequent 02/17/2020   Hyperlipidemia 06/03/2017   Essential hypertension 06/03/2017   Obesity (BMI 30.0-34.9) 06/03/2017   Past Medical History:  Diagnosis Date   Depression    Essential hypertension    Fatigue    Hypercholesteremia    Urinary incontinence    Past Surgical History:  Procedure Laterality Date   [place], 2002   x2   HERNIA REPAIR Bilateral    MENISCUS REPAIR Left 2011   Social History   Tobacco Use   Smoking status: Never   Smokeless tobacco: Never  Substance Use Topics   Alcohol use: Yes    Comment: social   Drug use: No   Family History  Problem Relation Age of Onset   Heart disease Mother    Arthritis Mother    Breast cancer Neg Hx    No Known Allergies Current Outpatient Medications on File Prior to Visit  Medication Sig Dispense Refill   Cholecalciferol (D3-1000 PO)      diclofenac Sodium (VOLTAREN) 1 % GEL Apply 2 g topically 4 (four) times daily as needed.     Fexofenadine HCl (ALLEGRA PO) Take 1 tablet by mouth daily as needed.     metoprolol tartrate (LOPRESSOR) 50 MG tablet Take 1 tablet (50 mg total) by mouth 2 (two) times daily. for blood pressure. 180 tablet 3   Multiple Vitamins-Minerals (MULTIVITAMIN ADULT EXTRA C PO)      Multiple Vitamins-Minerals (PRESERVISION AREDS 2 PO) Take 2 capsules by mouth daily. Take one in the morning and one at night.     naproxen sodium (ALEVE) 220 MG tablet Take 440 mg by mouth daily as needed.     sertraline (ZOLOFT) 50 MG tablet TAKE 1 TABLET BY MOUTH EVERY DAY FOR  ANXIETY 90 tablet 3   simvastatin (ZOCOR) 40 MG tablet TAKE 1 TABLET (40 MG TOTAL) BY MOUTH EVERY EVENING. FOR CHOLESTEROL. 90 tablet 2   No current facility-administered medications on file prior to visit.     Review of Systems  Constitutional:  Positive for appetite change and fatigue. Negative for fever.  HENT:  Positive for postnasal drip, rhinorrhea and sneezing. Negative for congestion, ear pain, sinus pressure and sore throat.   Eyes:  Negative for pain and discharge.  Respiratory:  Positive for cough. Negative for shortness of breath, wheezing and stridor.   Cardiovascular:  Negative for chest pain.  Gastrointestinal:  Negative for diarrhea, nausea and vomiting.  Genitourinary:  Negative for frequency, hematuria and urgency.  Musculoskeletal:  Negative for arthralgias and myalgias.  Skin:  Negative for rash.  Neurological:  Positive for headaches. Negative for dizziness, weakness and light-headedness.  Psychiatric/Behavioral:  Negative for confusion and dysphoric mood.        Objective:   Physical Exam Constitutional:      General: She is not in acute distress.    Appearance: Normal appearance. She is well-developed and normal weight. She is not ill-appearing, toxic-appearing or diaphoretic.  HENT:     Head: Normocephalic and atraumatic.     Comments: No sinus tenderness     Right Ear: Tympanic membrane, ear canal and external ear normal.     Left Ear: Tympanic membrane, ear canal and external ear normal.     Nose: Congestion and rhinorrhea present.     Mouth/Throat:     Mouth: Mucous membranes are moist.     Pharynx: Oropharynx is clear. No oropharyngeal exudate or posterior oropharyngeal erythema.     Comments: Clear pnd  Eyes:     General: No scleral icterus.       Right eye: No discharge.        Left eye: No discharge.     Conjunctiva/sclera: Conjunctivae normal.     Pupils: Pupils are equal, round, and reactive to light.  Neck:     Thyroid: No thyromegaly.      Vascular: No carotid bruit or JVD.  Cardiovascular:     Rate and Rhythm: Normal rate and regular rhythm.     Heart sounds: Normal heart sounds.     No gallop.  Pulmonary:     Effort: Pulmonary effort is normal. No respiratory distress.     Breath sounds: Normal breath sounds. No stridor. No wheezing, rhonchi or rales.  Chest:     Chest wall: No tenderness.  Abdominal:     General: Bowel sounds are normal. There is no distension.     Palpations: Abdomen is soft. There is no mass.     Tenderness: There is no abdominal tenderness.  Musculoskeletal:        General: No tenderness.     Cervical back: Normal range of motion and neck supple.  Lymphadenopathy:     Cervical: No cervical adenopathy.  Skin:    General: Skin is warm and dry.     Capillary Refill: Capillary refill takes less than 2 seconds.     Coloration: Skin is not pale.     Findings: No erythema or rash.  Neurological:     Mental Status: She is alert.     Cranial Nerves: No cranial nerve deficit.     Motor: No abnormal muscle tone.     Coordination: Coordination normal.     Deep Tendon Reflexes: Reflexes are normal and symmetric.  Psychiatric:        Mood and Affect: Mood normal.           Assessment & Plan:   Problem List Items Addressed This Visit       Other   COVID-19 - Primary    Day 2-3  Given age and hx disc antiviral tx, px  paxlovid and disc poss side eff Inst to hold her simvastatin while taking it  Symptom care- mucinex DM and tessalon prn Watch for wheeze and /or sob  ER precautions noted Call back parameters disc  Update if not starting to improve in a week or if worsening  Rev isolation (5 d min or until symptoms resolve) Then mask for 10 addnl days         Relevant Medications   nirmatrelvir/ritonavir (PAXLOVID) 20 x 150 MG & 10 x 100MG  TABS

## 2023-01-14 NOTE — Patient Instructions (Addendum)
Take paxlovid for covid for 5 days  Hold the simvastatin while you are on this   Watch your symptoms   Drink fluids and rest  mucinex DM is good for cough and congestion  You can add the tessalon pill as needed for cough as well   Nasal saline for congestion as needed  Tylenol for fever or pain or headache  Please alert [place] if symptoms worsen (if severe or short of breath please go to the ER)   Isolate for a minimum of 5 days or until your symptoms are gone  Then mask for 10 additional days once you go back out in public     Update if not starting to improve in a week or if worsening

## 2023-01-15 ENCOUNTER — Telehealth: Payer: Self-pay | Admitting: Primary Care

## 2023-01-15 ENCOUNTER — Encounter: Payer: Self-pay | Admitting: Family Medicine

## 2023-01-15 NOTE — Telephone Encounter (Signed)
Pt's daughter called stating the pt saw [facility] yesterday & was told she had covid. [facility] prescribed the meds, benzonatate (TESSALON) 200 MG capsule & nirmatrelvir/ritonavir (PAXLOVID) 20 x 150 MG & 10 x 100MG  TABS. Gatha Mayer states the pt is very nauseous & vomiting, cannot keep any food down. Sharron states pt cannot sleep either. Gatha Mayer is asking if this is side effects of meds or is it symptoms from covid? Sharron & pt was transferred to access nurse. Call back # [phone]

## 2023-01-15 NOTE — Telephone Encounter (Signed)
[facility] - Client TELEPHONE ADVICE RECORD [facility] Patient Name: Jenna Hogan Gender: Female DOB: 05/10/1941 Age: 82 Y 9 M 6 D Return Phone Number: [phone] (Primary), [phone] (Secondary) [place]  [address] Client [facility] - Client Client Site Treasure Provider Glori Bickers, Roque Lias - MD Contact Type Call Who Is Calling Patient / Member / Family / Caregiver Call Type Triage / [place] Name Gatha Mayer Return Phone Number [phone] (Primary) Chief Complaint Vomiting Reason for Call Symptomatic / Request for [place] states, mom in law was seen yesterday. She is positive for covid. Was given meds. Daughter in law says patient is very nauseas, vomiting, mom cannot keep anything down. Not sue if it's due to the medication. Daughter in law says mother in law is unable to sleep. Translation No No Triage Reason Other Nurse Assessment Nurse: Larene Pickett RN, Marcie Bal Date/Time Eilene Ghazi Time): 01/15/2023 4:04:22 PM Confirm and document reason for call. If symptomatic, describe symptoms. ---Caller states, mom in law was seen yesterday. She is positive for covid. Was given meds for the cough and Paxlovid. Daughter in law says patient is very nauseas, vomiting, unable to sleep, mom cannot keep anything down. Not sure if it's due to the medication. Daughter in law says mother in law is unable to sleep. Daughter in law is not with patient. Will call patient's number. Does the patient have any new or worsening symptoms? ---Yes Will a triage be completed? ---No Select reason for no triage. ---Other Please document clinical information provided and list any resource used. ---Caller is not with patient. Disp. Time Eilene Ghazi Time) Disposition Final User 01/15/2023 3:45:24 PM Attempt made - message left Bobby Rumpf 01/15/2023 3:45:39 PM Send To RN Personal Larene Pickett, RN, Marcie Bal 01/15/2023 4:07:59 PM Attempt made - message left Larene Pickett, RNMarcie Bal 01/15/2023 4:08:18 PM Send To RN Personal Larene Pickett, RN, Marcie Bal 01/15/2023 4:29:02 PM FINAL ATTEMPT MADE - message left Yes Larene Pickett, RN, Marcie Bal PLEASE NOTE: All timestamps contained within this report are represented as [place] Standard Time. CONFIDENTIALTY NOTICE: This fax transmission is intended only for the addressee. It contains information that is legally privileged, confidential or otherwise protected from use or disclosure. If you are not the intended recipient, you are strictly prohibited from reviewing, disclosing, copying using or disseminating any of this information or taking any action in reliance on or regarding this information. If you have received this fax in error, please notify us immediately by telephone so that we can arrange for its return to [place]. Phone: [phone], Toll-Free: [phone], Fax: [phone] Page: 2 of 2 Call Id: [id number] Final Disposition 01/15/2023 4:29:02 PM FINAL ATTEMPT MADE - message left Yes Larene Pickett, RN

## 2023-01-15 NOTE — Telephone Encounter (Signed)
I spoke with pt; pt said she was seen on 01/14/23 and dx with Covid. Pt said symptoms started on 01/13/23. Pt said initially she had no appetite but started vomiting this morning. Pt said she could keep water down but no food including broth and crackers. Pt said last night she could not sleep; nothing bothering her she just could not go to sleep and wondered if the med pt started after visit could cause that. Pt said she has some tightness in her chest when she has prod cough with clear phlegm but is not bothering her a lot. Pt does not have fever, no SOB,CP or dizziness. Pt said she does not have dry mouth. Pt said the benzonatate is helping the cough. Pt wants to know if should continue taking the Paxlovid or not; pt wonders if that could cause N&V and trouble sleeping. Pt said she does not need to go to UC or ED and request cb after note reviewed by Dr Glori Bickers. UC &  ED precautions given and pt voiced understanding.Sending note to Dr Glori Bickers, [employer] and will teams [place] CMA.

## 2023-01-15 NOTE — Telephone Encounter (Signed)
I doubt the paxlovid is keeping you awake but it could cause nausea and vomiting  If nausea persists I recommend you hold it   Please keep [place] updated I hope you feel better soon   I sent this by mychart also

## 2023-01-15 NOTE — Telephone Encounter (Signed)
Dr. Glori Bickers has left for today, will route message to PCP also

## 2023-01-16 MED ORDER — ONDANSETRON HCL 4 MG PO TABS: 4.0000 mg | ORAL_TABLET | Freq: Three times a day (TID) | ORAL | 0 refills | Status: DC | PRN

## 2023-01-16 NOTE — Telephone Encounter (Signed)
Called and informed pt and she said she would stop talking the Paxlovid.

## 2023-01-16 NOTE — Addendum Note (Signed)
Addended by: Loura Pardon A on: 01/16/2023 04:51 PM   Modules accepted: Orders

## 2023-01-16 NOTE — Telephone Encounter (Signed)
I sent in some zofran for nausea Please stop the paxlovid If not improved f/u in the office  Please keep [place] posted

## 2023-01-16 NOTE — Telephone Encounter (Signed)
Patient called in and stated she is still experiencing some nauseous. She took 2 pills this morning 30 minutes apart after eating an egg. She stated that it is staying down right now, she stated that it could possibly be because she haven't eating in 2 days. She was wanting to know if something could be sent in for nausea or she should just stop. I did relay message below but she wasn't sure if she should still hold it. Please advise. Thank you!

## 2023-01-21 ENCOUNTER — Telehealth: Payer: Self-pay | Admitting: Primary Care

## 2023-01-21 NOTE — Telephone Encounter (Signed)
Patient son called and stated his mother was told to stop one medication because it was hurting her stomach and he wanted to know if she should take the other medication plaxlovid for covid, Made an appointment for patient to see Carlis Abbott on 01/22/2023 at 11:40.

## 2023-01-21 NOTE — Telephone Encounter (Signed)
Called and spoke to patient, she has stopped taking the plaxlovid and starting on zofran for the nausea and vomiting. Since stopping plaxlovid, and starting zofran the nausea and vomitting have improved a lot. She is still experiencing generalized fatigue and weakness. Offered virtual visit today with a provider, she declined and wants to wait till 1/23 to see Allie Bossier in office.

## 2023-01-22 ENCOUNTER — Ambulatory Visit (INDEPENDENT_AMBULATORY_CARE_PROVIDER_SITE_OTHER): Payer: Medicare HMO | Admitting: Primary Care

## 2023-01-22 ENCOUNTER — Encounter: Payer: Self-pay | Admitting: Primary Care

## 2023-01-22 VITALS — BP 138/78 | HR 58 | Temp 97.0°F | Ht 62.5 in | Wt 157.0 lb

## 2023-01-22 DIAGNOSIS — R051 Acute cough: Secondary | ICD-10-CM

## 2023-01-22 MED ORDER — AZITHROMYCIN 250 MG PO TABS: ORAL_TABLET | ORAL | 0 refills | Status: DC

## 2023-01-22 NOTE — Patient Instructions (Signed)
Start Azithromycin antibiotics for infection. Take 2 tablets by mouth today, then 1 tablet daily for 4 additional days.  Continue the Tessalon Perles for cough.  It was a pleasure to see you today!

## 2023-01-22 NOTE — Progress Notes (Signed)
Subjective:    Patient ID: Jenna Hogan, female    DOB: 04/24/1941, 82 y.o.   MRN: [id number]  Cough Pertinent negatives include no chills, postnasal drip or shortness of breath.    Irem Stoneham is a very pleasant 82 y.o. female with a history of hypertension, hyperlipidemia, Covid-19 infection who presents today to discuss Covid-19 symptoms  Evaluated by Dr. Milinda Antis on 01/14/23 for a 2-3 day history of "URI symptoms" of cough, chest congestion, headaches, rhinorrhea. She was sick before Christmas but symptoms resolved. Her Covid-19 test was positive so she was treated with Paxlovid course, Tessalon Perles, and Mucinex.   Today she continues experience cough and fatigue. She took a shower this morning and feels "exhausted". She took a two doses of Paxlovid before discontinuing as she developed side effects of nausea with vomiting. She's been taking Zofran which was prescribed for nausea, this has helped some.  She took another Covid-19 test two days ago which was negative.    Review of Systems  Constitutional:  Positive for fatigue. Negative for chills.  HENT:  Positive for congestion. Negative for postnasal drip and sinus pressure.   Respiratory:  Positive for cough. Negative for shortness of breath.          Past Medical History:  Diagnosis Date   Depression    Essential hypertension    Fatigue    Hypercholesteremia    Urinary incontinence     Social History   Socioeconomic History   Marital status: Widowed    Spouse name: Not on file   Number of children: Not on file   Years of education: Not on file   Highest education level: Not on file  Occupational History   Not on file  Tobacco Use   Smoking status: Never   Smokeless tobacco: Never  Substance and Sexual Activity   Alcohol use: Yes    Comment: social   Drug use: No   Sexual activity: Not on file  Other Topics Concern   Not on file  Social History Narrative   Married.   Retired. Once worked in  Community education officer.   Enjoys crocheting, spending time with family, traveling.    Social Determinants of Health   Financial Resource Strain: Low Risk  (03/05/2022)   Overall Financial Resource Strain (CARDIA)    Difficulty of Paying Living Expenses: Not hard at all  Food Insecurity: No Food Insecurity (03/05/2022)   Hunger Vital Sign    Worried About Running Out of Food in the Last Year: Never true    Ran Out of Food in the Last Year: Never true  Transportation Needs: No Transportation Needs (03/05/2022)   PRAPARE - Administrator, Civil Service (Medical): No    Lack of Transportation (Non-Medical): No  Physical Activity: Insufficiently Active (03/05/2022)   Exercise Vital Sign    Days of Exercise per Week: 1 day    Minutes of Exercise per Session: 50 min  Stress: Stress Concern Present (03/05/2022)   [employer] of Occupational Health - Occupational Stress Questionnaire    Feeling of Stress : To some extent  Social Connections: Socially Integrated (03/05/2022)   Social Connection and Isolation Panel [NHANES]    Frequency of Communication with Friends and Family: More than three times a week    Frequency of Social Gatherings with Friends and Family: More than three times a week    Attends Religious Services: More than 4 times per year    Active Member of [employer] or Organizations:  Yes    Attends Club or Organization Meetings: More than 4 times per year    Marital Status: Married  Human resources officer Violence: Not At Risk (03/05/2022)   Humiliation, Afraid, Rape, and Kick questionnaire    Fear of Current or Ex-Partner: No    Emotionally Abused: No    Physically Abused: No    Sexually Abused: No    Past Surgical History:  Procedure Laterality Date   ABDOMINAL HYSTERECTOMY     [place], 2002   x2   HERNIA REPAIR Bilateral    MENISCUS REPAIR Left 2011    Family History  Problem Relation Age of Onset   Heart disease Mother    Arthritis Mother    Breast cancer Neg  Hx     No Known Allergies  Current Outpatient Medications on File Prior to Visit  Medication Sig Dispense Refill   benzonatate (TESSALON) 200 MG capsule Take 1 capsule (200 mg total) by mouth 3 (three) times daily as needed for cough. Swallow whole, to not bite pill 30 capsule 1   Cholecalciferol (D3-1000 PO)      diclofenac Sodium (VOLTAREN) 1 % GEL Apply 2 g topically 4 (four) times daily as needed.     Fexofenadine HCl (ALLEGRA PO) Take 1 tablet by mouth daily as needed.     metoprolol tartrate (LOPRESSOR) 50 MG tablet Take 1 tablet (50 mg total) by mouth 2 (two) times daily. for blood pressure. 180 tablet 3   Multiple Vitamins-Minerals (MULTIVITAMIN ADULT EXTRA C PO)      Multiple Vitamins-Minerals (PRESERVISION AREDS 2 PO) Take 2 capsules by mouth daily. Take one in the morning and one at night.     naproxen sodium (ALEVE) 220 MG tablet Take 440 mg by mouth daily as needed.     ondansetron (ZOFRAN) 4 MG tablet Take 1 tablet (4 mg total) by mouth every 8 (eight) hours as needed for nausea or vomiting. 12 tablet 0   sertraline (ZOLOFT) 50 MG tablet TAKE 1 TABLET BY MOUTH EVERY DAY FOR ANXIETY 90 tablet 3   simvastatin (ZOCOR) 40 MG tablet TAKE 1 TABLET (40 MG TOTAL) BY MOUTH EVERY EVENING. FOR CHOLESTEROL. 90 tablet 2   No current facility-administered medications on file prior to visit.    BP 138/78   Pulse (!) 58   Temp (!) 97 F (36.1 C) (Temporal)   Ht 5' 2.5" (1.588 m)   Wt 157 lb (71.2 kg)   SpO2 99%   BMI 28.26 kg/m  Objective:   Physical Exam Constitutional:      Appearance: She is ill-appearing.  HENT:     Right Ear: Tympanic membrane and ear canal normal.     Left Ear: Tympanic membrane and ear canal normal.     Nose:     Right Sinus: No maxillary sinus tenderness or frontal sinus tenderness.     Left Sinus: No maxillary sinus tenderness or frontal sinus tenderness.  Eyes:     Conjunctiva/sclera: Conjunctivae normal.  Cardiovascular:     Rate and Rhythm:  Normal rate and regular rhythm.  Pulmonary:     Effort: Pulmonary effort is normal.     Breath sounds: Normal breath sounds. No wheezing or rales.     Comments: Dry cough during visit Musculoskeletal:     Cervical back: Neck supple.  Lymphadenopathy:     Cervical: No cervical adenopathy.  Skin:    General: Skin is warm and dry.  Assessment & Plan:  Acute cough Assessment & Plan: Likely remnants from recent Covid-19 infection, discussed this today with patient. She would like antibiotic treatment.  Given her presentation, age, and lack of overall improvement, it is reasonable to treat for any bacterial cause.   Start Azithromycin antibiotics for infection. Take 2 tablets by mouth today, then 1 tablet daily for 4 additional days. Discussed that if no improvement then likely still viral etiology. She verbalized understanding.  Continue Tessalon Perles. Follow up PRN.  Orders: -     Azithromycin; Take 2 tablets by mouth today, then 1 tablet daily for 4 additional days.  Dispense: 6 tablet; Refill: 0        Pleas Koch, NP

## 2023-01-22 NOTE — Assessment & Plan Note (Signed)
Likely remnants from recent Covid-19 infection, discussed this today with patient. She would like antibiotic treatment.  Given her presentation, age, and lack of overall improvement, it is reasonable to treat for any bacterial cause.   Start Azithromycin antibiotics for infection. Take 2 tablets by mouth today, then 1 tablet daily for 4 additional days. Discussed that if no improvement then likely still viral etiology. She verbalized understanding.  Continue Tessalon Perles. Follow up PRN.

## 2023-01-27 ENCOUNTER — Other Ambulatory Visit: Payer: Self-pay | Admitting: Primary Care

## 2023-01-27 DIAGNOSIS — E785 Hyperlipidemia, unspecified: Secondary | ICD-10-CM

## 2023-01-27 NOTE — Telephone Encounter (Signed)
Patient is due for CPE/follow up in late March. Please schedule, thank you!

## 2023-01-28 NOTE — Telephone Encounter (Signed)
Scheduled cpe for 03/28/23

## 2023-02-26 ENCOUNTER — Telehealth: Payer: Self-pay | Admitting: Primary Care

## 2023-02-26 NOTE — Telephone Encounter (Signed)
Contacted Jenna Hogan to schedule their annual wellness visit. Appointment made for 03/27/2023.  [place] Direct Dial: [phone]

## 2023-03-03 NOTE — Progress Notes (Unsigned)
    Maelee Hoot T. Lathen Seal, MD, [place] [facility] [place], [address]  Phone: [phone]  FAX: [phone]  Jenna Hogan - 82 y.o. female  MRN [id number]  Date of Birth: June 13, 1941  Date: 03/04/2023  PCP: Pleas Koch, NP  Referral: Pleas Koch, NP  No chief complaint on file.  Subjective:   Jenna Hogan is a 82 y.o. very pleasant female patient with There is no height or weight on file to calculate BMI. who presents with the following:  She is a very pleasant lady, and I saw her 5 months ago with some bilateral knee osteoarthritis exacerbation.  She is here today again with some ongoing knee pain.    Review of Systems is noted in the HPI, as appropriate  Objective:   There were no vitals taken for this visit.  GEN: No acute distress; alert,appropriate. PULM: Breathing comfortably in no respiratory distress PSYCH: Normally interactive.   Laboratory and Imaging Data:  Assessment and Plan:   ***

## 2023-03-04 ENCOUNTER — Encounter: Payer: Self-pay | Admitting: Family Medicine

## 2023-03-04 ENCOUNTER — Ambulatory Visit (INDEPENDENT_AMBULATORY_CARE_PROVIDER_SITE_OTHER): Payer: Medicare HMO | Admitting: Family Medicine

## 2023-03-04 VITALS — BP 147/67 | HR 57 | Temp 98.3°F | Ht 62.5 in | Wt 160.4 lb

## 2023-03-04 DIAGNOSIS — M170 Bilateral primary osteoarthritis of knee: Secondary | ICD-10-CM

## 2023-03-04 MED ORDER — TRIAMCINOLONE ACETONIDE 40 MG/ML IJ SUSP
40.0000 mg | Freq: Once | INTRAMUSCULAR | Status: AC
  Administered 2023-03-04: 40 mg via INTRA_ARTICULAR

## 2023-03-04 NOTE — Patient Instructions (Signed)
OSTEOARTHRITIS: Over the counter  Tylenol: 2 tablets up to 3-4 times a day Regular NSAIDS are helpful (avoid in kidney disease and ulcers)  Topical Capzaicin Cream, as needed (wear glove to put on) - THIS IS EXCEPTIONALLY HOT  Supplements: Tart cherry juice and Curcumin (Turmeric extract) have good scientific evidence  - get the concentrated capsules or gelcaps over the counter so you do not get the calories from the juice.  Weight loss will always take stress off of the joints and back  Voltaren 1% gel, over the counter You can apply up to 4 times a day  This can be applied to any joint: knee, wrist, fingers, elbows, shoulders, feet and ankles. Can apply to any tendon: tennis elbow, achilles, tendon, rotator cuff or any other tendon.  Minimal is absorbed in the bloodstream: ok with oral anti-inflammatory or a blood thinner.  Cost is about 9 dollars  Ice joints on bad days, 20 min, 2-3 x / day REGULAR EXERCISE: swimming, Yoga, Tai Chi, bicycle (NON-IMPACT activity)   

## 2023-03-05 ENCOUNTER — Encounter: Payer: Self-pay | Admitting: Family Medicine

## 2023-03-07 ENCOUNTER — Ambulatory Visit (INDEPENDENT_AMBULATORY_CARE_PROVIDER_SITE_OTHER): Payer: Medicare HMO

## 2023-03-07 VITALS — Ht 62.5 in | Wt 159.0 lb

## 2023-03-07 DIAGNOSIS — Z1231 Encounter for screening mammogram for malignant neoplasm of breast: Secondary | ICD-10-CM | POA: Diagnosis not present

## 2023-03-07 DIAGNOSIS — Z0000 Encounter for general adult medical examination without abnormal findings: Secondary | ICD-10-CM | POA: Diagnosis not present

## 2023-03-07 NOTE — Patient Instructions (Signed)
Ms. Jenna Hogan , Thank you for taking time to come for your Medicare Wellness Visit. I appreciate your ongoing commitment to your health goals. Please review the following plan we discussed and let me know if I can assist you in the future.   These are the goals we discussed:  Goals      Patient Stated     03/03/2021, I will maintain and continue medications as prescribed.      Patient Stated     Would like to continue current routine, more involved with church since husband passed     Patient Stated     Remain independent.        This is a list of the screening recommended for you and due dates:  Health Maintenance  Topic Date Due   Zoster (Shingles) Vaccine (1 of 2) Never done   Pneumonia Vaccine (2 of 2 - PCV) 09/21/2009   COVID-19 Vaccine (4 - 2023-24 season) 08/31/2022   Medicare Annual Wellness Visit  03/06/2024   DTaP/Tdap/Td vaccine (2 - Td or Tdap) 04/01/2027   Flu Shot  Completed   DEXA scan (bone density measurement)  Completed   HPV Vaccine  Aged Out    Advanced directives: Advance directive discussed with you today. Even though you declined this today, please call our office should you change your mind, and we can give you the proper paperwork for you to fill out.   Conditions/risks identified: Aim for 30 minutes of exercise or brisk walking, 6-8 glasses of water, and 5 servings of fruits and vegetables each day.   Next appointment: Follow up in one year for your annual wellness visit 03/11/2024 @ 11:30   Preventive Care 65 Years and Older, Female Preventive care refers to lifestyle choices and visits with your health care provider that can promote health and wellness. What does preventive care include? A yearly physical exam. This is also called an annual well check. Dental exams once or twice a year. Routine eye exams. Ask your health care provider how often you should have your eyes checked. Personal lifestyle choices, including: Daily care of your teeth and  gums. Regular physical activity. Eating a healthy diet. Avoiding tobacco and drug use. Limiting alcohol use. Practicing safe sex. Taking low-dose aspirin every day. Taking vitamin and mineral supplements as recommended by your health care provider. What happens during an annual well check? The services and screenings done by your health care provider during your annual well check will depend on your age, overall health, lifestyle risk factors, and family history of disease. Counseling  Your health care provider may ask you questions about your: Alcohol use. Tobacco use. Drug use. Emotional well-being. Home and relationship well-being. Sexual activity. Eating habits. History of falls. Memory and ability to understand (cognition). Work and work Statistician. Reproductive health. Screening  You may have the following tests or measurements: Height, weight, and BMI. Blood pressure. Lipid and cholesterol levels. These may be checked every 5 years, or more frequently if you are over 98 years old. Skin check. Lung cancer screening. You may have this screening every year starting at age 14 if you have a 30-pack-year history of smoking and currently smoke or have quit within the past 15 years. Fecal occult blood test (FOBT) of the stool. You may have this test every year starting at age 41. Flexible sigmoidoscopy or colonoscopy. You may have a sigmoidoscopy every 5 years or a colonoscopy every 10 years starting at age 73. Hepatitis C blood test. Hepatitis  B blood test. Sexually transmitted disease (STD) testing. Diabetes screening. This is done by checking your blood sugar (glucose) after you have not eaten for a while (fasting). You may have this done every 1-3 years. Bone density scan. This is done to screen for osteoporosis. You may have this done starting at age 2. Mammogram. This may be done every 1-2 years. Talk to your health care provider about how often you should have regular  mammograms. Talk with your health care provider about your test results, treatment options, and if necessary, the need for more tests. Vaccines  Your health care provider may recommend certain vaccines, such as: Influenza vaccine. This is recommended every year. Tetanus, diphtheria, and acellular pertussis (Tdap, Td) vaccine. You may need a Td booster every 10 years. Zoster vaccine. You may need this after age 16. Pneumococcal 13-valent conjugate (PCV13) vaccine. One dose is recommended after age 33. Pneumococcal polysaccharide (PPSV23) vaccine. One dose is recommended after age 51. Talk to your health care provider about which screenings and vaccines you need and how often you need them. This information is not intended to replace advice given to you by your health care provider. Make sure you discuss any questions you have with your health care provider. Document Released: 01/13/2016 Document Revised: 09/05/2016 Document Reviewed: 10/18/2015 Elsevier Interactive Patient Education  2017 [place] Prevention in the Home Falls can cause injuries. They can happen to people of all ages. There are many things you can do to make your home safe and to help prevent falls. What can I do on the outside of my home? Regularly fix the edges of walkways and driveways and fix any cracks. Remove anything that might make you trip as you walk through a door, such as a raised step or threshold. Trim any bushes or trees on the path to your home. Use bright outdoor lighting. Clear any walking paths of anything that might make someone trip, such as rocks or tools. Regularly check to see if handrails are loose or broken. Make sure that both sides of any steps have handrails. Any raised decks and porches should have guardrails on the edges. Have any leaves, snow, or ice cleared regularly. Use sand or salt on walking paths during winter. Clean up any spills in your garage right away. This includes oil  or grease spills. What can I do in the bathroom? Use night lights. Install grab bars by the toilet and in the tub and shower. Do not use towel bars as grab bars. Use non-skid mats or decals in the tub or shower. If you need to sit down in the shower, use a plastic, non-slip stool. Keep the floor dry. Clean up any water that spills on the floor as soon as it happens. Remove soap buildup in the tub or shower regularly. Attach bath mats securely with double-sided non-slip rug tape. Do not have throw rugs and other things on the floor that can make you trip. What can I do in the bedroom? Use night lights. Make sure that you have a light by your bed that is easy to reach. Do not use any sheets or blankets that are too big for your bed. They should not hang down onto the floor. Have a firm chair that has side arms. You can use this for support while you get dressed. Do not have throw rugs and other things on the floor that can make you trip. What can I do in the kitchen? Clean up any  spills right away. Avoid walking on wet floors. Keep items that you use a lot in easy-to-reach places. If you need to reach something above you, use a strong step stool that has a grab bar. Keep electrical cords out of the way. Do not use floor polish or wax that makes floors slippery. If you must use wax, use non-skid floor wax. Do not have throw rugs and other things on the floor that can make you trip. What can I do with my stairs? Do not leave any items on the stairs. Make sure that there are handrails on both sides of the stairs and use them. Fix handrails that are broken or loose. Make sure that handrails are as long as the stairways. Check any carpeting to make sure that it is firmly attached to the stairs. Fix any carpet that is loose or worn. Avoid having throw rugs at the top or bottom of the stairs. If you do have throw rugs, attach them to the floor with carpet tape. Make sure that you have a light  switch at the top of the stairs and the bottom of the stairs. If you do not have them, ask someone to add them for you. What else can I do to help prevent falls? Wear shoes that: Do not have high heels. Have rubber bottoms. Are comfortable and fit you well. Are closed at the toe. Do not wear sandals. If you use a stepladder: Make sure that it is fully opened. Do not climb a closed stepladder. Make sure that both sides of the stepladder are locked into place. Ask someone to hold it for you, if possible. Clearly mark and make sure that you can see: Any grab bars or handrails. First and last steps. Where the edge of each step is. Use tools that help you move around (mobility aids) if they are needed. These include: Canes. Walkers. Scooters. Crutches. Turn on the lights when you go into a dark area. Replace any light bulbs as soon as they burn out. Set up your furniture so you have a clear path. Avoid moving your furniture around. If any of your floors are uneven, fix them. If there are any pets around you, be aware of where they are. Review your medicines with your doctor. Some medicines can make you feel dizzy. This can increase your chance of falling. Ask your doctor what other things that you can do to help prevent falls. This information is not intended to replace advice given to you by your health care provider. Make sure you discuss any questions you have with your health care provider. Document Released: 10/13/2009 Document Revised: 05/24/2016 Document Reviewed: 01/21/2015 Elsevier Interactive Patient Education  2017 [employer].

## 2023-03-07 NOTE — Progress Notes (Signed)
I connected with  Jenna Hogan on 03/07/23 by a audio enabled telemedicine application and verified that I am speaking with the correct person using two identifiers.  Patient Location: Home  Provider Location: Home Office  I discussed the limitations of evaluation and management by telemedicine. The patient expressed understanding and agreed to proceed.  Subjective:   Jenna Hogan is a 82 y.o. female who presents for Medicare Annual (Subsequent) preventive examination.  Review of Systems      Cardiac Risk Factors include: advanced age (>69mn, >>78women);hypertension     Objective:    Today's Vitals   03/07/23 0941 03/07/23 0948  Weight: 159 lb (72.1 kg)   Height: 5' 2.5" (1.588 m)   PainSc:  0-No pain   Body mass index is 28.62 kg/m.     03/07/2023   10:03 AM 03/05/2022    9:51 AM 03/03/2021    9:48 AM 05/07/2015    7:29 PM 05/06/2015    4:49 PM  Advanced Directives  Does Patient Have a Medical Advance Directive? No Yes Yes No No  Type of ACorporate treasurerof APaterosLiving will HSt. CharlesLiving will    Does patient want to make changes to medical advance directive?  Yes (MAU/Ambulatory/Procedural Areas - Information given)     Copy of [place] Chart?   No - copy requested    Would patient like information on creating a medical advance directive? No - Patient declined   No - patient declined information No - patient declined information    Current Medications (verified) Outpatient Encounter Medications as of 03/07/2023  Medication Sig   calcium carbonate (OS-CAL - DOSED IN MG OF ELEMENTAL CALCIUM) 1250 (500 Ca) MG tablet Take 2 tablets by mouth.   Cholecalciferol (D3-1000 PO)    diclofenac Sodium (VOLTAREN) 1 % GEL Apply 2 g topically 4 (four) times daily as needed.   Fexofenadine HCl (ALLEGRA PO) Take 1 tablet by mouth daily as needed.   metoprolol tartrate (LOPRESSOR) 50 MG tablet Take 1 tablet (50 mg  total) by mouth 2 (two) times daily. for blood pressure.   Multiple Vitamins-Minerals (MULTIVITAMIN ADULT EXTRA C PO)    Multiple Vitamins-Minerals (PRESERVISION AREDS 2 PO) Take 2 capsules by mouth daily. Take one in the morning and one at night.   naproxen sodium (ALEVE) 220 MG tablet Take 440 mg by mouth daily as needed.   sertraline (ZOLOFT) 50 MG tablet TAKE 1 TABLET BY MOUTH EVERY DAY FOR ANXIETY   simvastatin (ZOCOR) 40 MG tablet TAKE 1 TABLET (40 MG TOTAL) BY MOUTH EVERY EVENING. FOR CHOLESTEROL.   ondansetron (ZOFRAN) 4 MG tablet Take 1 tablet (4 mg total) by mouth every 8 (eight) hours as needed for nausea or vomiting. (Patient not taking: Reported on 03/07/2023)   No facility-administered encounter medications on file as of 03/07/2023.    Allergies (verified) Patient has no known allergies.   History: Past Medical History:  Diagnosis Date   Depression    Essential hypertension    Fatigue    Hypercholesteremia    Urinary incontinence    Past Surgical History:  Procedure Laterality Date   ABDOMINAL HYSTERECTOMY     CARPAL TUNNEL RELEASE  1998, 2002   x2   HERNIA REPAIR Bilateral    MENISCUS REPAIR Left 2011   Family History  Problem Relation Age of Onset   Heart disease Mother    Arthritis Mother    Breast cancer Neg Hx  Social History   Socioeconomic History   Marital status: Widowed    Spouse name: Not on file   Number of children: Not on file   Years of education: Not on file   Highest education level: Not on file  Occupational History   Not on file  Tobacco Use   Smoking status: Never   Smokeless tobacco: Never  Substance and Sexual Activity   Alcohol use: Yes    Comment: social   Drug use: No   Sexual activity: Not on file  Other Topics Concern   Not on file  Social History Narrative   Married.   Retired. Once worked in Insurance underwriter.   Enjoys crocheting, spending time with family, traveling.    Social Determinants of Health   Financial Resource  Strain: Low Risk  (03/07/2023)   Overall Financial Resource Strain (CARDIA)    Difficulty of Paying Living Expenses: Not hard at all  Food Insecurity: No Food Insecurity (03/07/2023)   Hunger Vital Sign    Worried About Running Out of Food in the Last Year: Never true    Ran Out of Food in the Last Year: Never true  Transportation Needs: No Transportation Needs (03/07/2023)   PRAPARE - Hydrologist (Medical): No    Lack of Transportation (Non-Medical): No  Physical Activity: Inactive (03/07/2023)   Exercise Vital Sign    Days of Exercise per Week: 0 days    Minutes of Exercise per Session: 0 min  Stress: No Stress Concern Present (03/07/2023)   [place]    Feeling of Stress : Not at all  Social Connections: Moderately Integrated (03/07/2023)   Social Connection and Isolation Panel [NHANES]    Frequency of Communication with Friends and Family: More than three times a week    Frequency of Social Gatherings with Friends and Family: More than three times a week    Attends Religious Services: More than 4 times per year    Active Member of [employer] or Organizations: Yes    Attends Archivist Meetings: More than 4 times per year    Marital Status: Widowed    Tobacco Counseling Counseling given: Not Answered   Clinical Intake:  Pre-visit preparation completed: Yes  Pain : No/denies pain Pain Score: 0-No pain     Nutritional Risks: None Diabetes: No  How often do you need to have someone help you when you read instructions, pamphlets, or other written materials from your doctor or pharmacy?: 1 - Never  Diabetic?no  Interpreter Needed?: No  Information entered by :: [place] of Daily Living    03/07/2023   10:05 AM  In your present state of health, do you have any difficulty performing the following activities:  Hearing? 0  Vision? 0  Difficulty concentrating  or making decisions? 0  Walking or climbing stairs? 0  Dressing or bathing? 0  Doing errands, shopping? 0  Preparing Food and eating ? N  Using the Toilet? N  In the past six months, have you accidently leaked urine? Y  Comment occasionally when coughs or sneezes, wears pad  Do you have problems with loss of bowel control? N  Managing your Medications? N  Managing your Finances? N  Housekeeping or managing your Housekeeping? N    Patient Care Team: Pleas Koch, NP as PCP - General (Internal Medicine)  Indicate any recent Medical Services you may have received from other  than [facility] providers in the past year (date may be approximate).     Assessment:   This is a routine wellness examination for [place].  Hearing/Vision screen Hearing Screening - Comments:: No aids Vision Screening - Comments:: Glasses - [facility]  Dietary issues and exercise activities discussed: Current Exercise Habits: The patient does not participate in regular exercise at present, Exercise limited by: orthopedic condition(s) (knee pain)   Goals Addressed             This Visit's Progress    Patient Stated       Remain independent.       Depression Screen    03/07/2023    9:59 AM 01/14/2023    3:11 PM 06/15/2022   11:09 AM 03/05/2022    9:54 AM 03/03/2021    9:50 AM 02/17/2020    9:21 AM  PHQ 2/9 Scores  PHQ - 2 Score 0 0 0 1 0 0  PHQ- 9 Score 0 2   0     Fall Risk    03/07/2023   10:05 AM 01/14/2023    3:11 PM 06/15/2022   11:08 AM 03/05/2022    9:53 AM 03/03/2021    9:49 AM  Fall Risk   Falls in the past year? 0 0 0 0 0  Number falls in past yr: 0 0 0 0 0  Injury with Fall? 0 0 0 0 0  Risk for fall due to : No Fall Risks No Fall Risks  No Fall Risks Medication side effect  Follow up Falls prevention discussed;Falls evaluation completed Falls evaluation completed  Falls prevention discussed Falls evaluation completed;Falls prevention discussed    FALL RISK PREVENTION PERTAINING TO  THE HOME:  Any stairs in or around the home? Yes  If so, are there any without handrails? No  Home free of loose throw rugs in walkways, pet beds, electrical cords, etc? Yes  Adequate lighting in your home to reduce risk of falls? Yes   ASSISTIVE DEVICES UTILIZED TO PREVENT FALLS:  Life alert? No  Use of a cane, walker or w/c? No  Grab bars in the bathroom? Yes  Shower chair or bench in shower? Yes  Elevated toilet seat or a handicapped toilet? No    Cognitive Function:    03/03/2021    9:55 AM  MMSE - Mini Mental State Exam  Orientation to time 5  Orientation to Place 5  Registration 3  Attention/ Calculation 5  Recall 3  Language- repeat 1        03/07/2023   10:06 AM  6CIT Screen  What Year? 0 points  What month? 0 points  What time? 0 points  Count back from 20 0 points  Months in reverse 0 points  Repeat phrase 2 points  Total Score 2 points    Immunizations Immunization History  Administered Date(s) Administered   Fluad Quad(high Dose 65+) 09/09/2019, 10/05/2022   Influenza Split 10/07/2007, 09/21/2008, 11/01/2009   Influenza, High Dose Seasonal PF 10/12/2021   Influenza,inj,quad, With Preservative 10/10/2010   Influenza-Unspecified 09/30/2020   PFIZER(Purple Top)SARS-COV-2 Vaccination 01/22/2020, 02/14/2020, 10/17/2020   Pneumococcal Polysaccharide-23 09/21/2008   Tdap 03/31/2017    TDAP status: Up to date  Flu Vaccine status: Up to date  Pneumococcal vaccine status: Due, Education has been provided regarding the importance of this vaccine. Advised may receive this vaccine at local pharmacy or Health Dept. Aware to provide a copy of the vaccination record if obtained from local pharmacy or Health  Dept. Verbalized acceptance and understanding.  Covid-19 vaccine status: Declined, Education has been provided regarding the importance of this vaccine but patient still declined. Advised may receive this vaccine at local pharmacy or Health Dept.or vaccine  clinic. Aware to provide a copy of the vaccination record if obtained from local pharmacy or Health Dept. Verbalized acceptance and understanding.  Qualifies for Shingles Vaccine? Yes   Zostavax completed  unknown   Shingrix Completed?: No.    Education has been provided regarding the importance of this vaccine. Patient has been advised to call insurance company to determine out of pocket expense if they have not yet received this vaccine. Advised may also receive vaccine at local pharmacy or Health Dept. Verbalized acceptance and understanding.  Screening Tests Health Maintenance  Topic Date Due   Zoster Vaccines- Shingrix (1 of 2) Never done   Pneumonia Vaccine 85+ Years old (2 of 2 - PCV) 09/21/2009   COVID-19 Vaccine (4 - 2023-24 season) 08/31/2022   Medicare Annual Wellness (AWV)  03/06/2024   DTaP/Tdap/Td (2 - Td or Tdap) 04/01/2027   INFLUENZA VACCINE  Completed   DEXA SCAN  Completed   HPV VACCINES  Aged Out    Health Maintenance  Health Maintenance Due  Topic Date Due   Zoster Vaccines- Shingrix (1 of 2) Never done   Pneumonia Vaccine 78+ Years old (2 of 2 - PCV) 09/21/2009   COVID-19 Vaccine (4 - 2023-24 season) 08/31/2022    Colorectal cancer screening: No longer required.   Mammogram status: Completed 05/02/2022. Repeat every year  Bone Density status: Completed 05/02/2022. Results reflect: Bone density results: OSTEOPENIA. Repeat every 2 years.  Lung Cancer Screening: (Low Dose CT Chest recommended if Age 33-80 years, 30 pack-year currently smoking OR have quit w/in 15years.) does not qualify.   Lung Cancer Screening Referral: no  Additional Screening:  Hepatitis C Screening: does not qualify; Completed no  Vision Screening: Recommended annual ophthalmology exams for early detection of glaucoma and other disorders of the eye. Is the patient up to date with their annual eye exam?  Yes  Who is the provider or what is the name of the office in which the patient  attends annual eye exams? [place] If pt is not established with a provider, would they like to be referred to a provider to establish care? No .   Dental Screening: Recommended annual dental exams for proper oral hygiene  Community Resource Referral / Chronic Care Management: CRR required this visit?  No   CCM required this visit?  No      Plan:     I have personally reviewed and noted the following in the patient's chart:   Medical and social history Use of alcohol, tobacco or illicit drugs  Current medications and supplements including opioid prescriptions. Patient is not currently taking opioid prescriptions. Functional ability and status Nutritional status Physical activity Advanced directives List of other physicians Hospitalizations, surgeries, and ER visits in previous 12 months Vitals Screenings to include cognitive, depression, and falls Referrals and appointments  In addition, I have reviewed and discussed with patient certain preventive protocols, quality metrics, and best practice recommendations. A written personalized care plan for preventive services as well as general preventive health recommendations were provided to patient.     Lebron Conners, LPN   [id number]   Nurse Notes: Mammogram ordered. Pt declined COVID vaccines.

## 2023-03-13 ENCOUNTER — Other Ambulatory Visit: Payer: Self-pay | Admitting: Primary Care

## 2023-03-13 DIAGNOSIS — I10 Essential (primary) hypertension: Secondary | ICD-10-CM

## 2023-03-18 ENCOUNTER — Telehealth: Payer: Self-pay | Admitting: *Deleted

## 2023-03-18 NOTE — Telephone Encounter (Signed)
Spoke with Jenna Hogan to let her know that Dr. Lorelei Pont looked into the [employer] Benefits and per [employer]:  Deductible does not apply.  Once the Out of Pocket has been met which is $3600 and patient has currently med $0, then patient is covered at 100%.  Only one co-pay applies per date of service.  Prior authorization is not required.   Patient states understanding.

## 2023-03-28 ENCOUNTER — Other Ambulatory Visit: Payer: Self-pay | Admitting: Primary Care

## 2023-03-28 ENCOUNTER — Ambulatory Visit (INDEPENDENT_AMBULATORY_CARE_PROVIDER_SITE_OTHER): Payer: Medicare HMO | Admitting: Primary Care

## 2023-03-28 ENCOUNTER — Encounter: Payer: Self-pay | Admitting: Primary Care

## 2023-03-28 VITALS — BP 136/76 | HR 59 | Temp 97.1°F | Ht 62.5 in | Wt 157.0 lb

## 2023-03-28 DIAGNOSIS — E785 Hyperlipidemia, unspecified: Secondary | ICD-10-CM | POA: Diagnosis not present

## 2023-03-28 DIAGNOSIS — Z23 Encounter for immunization: Secondary | ICD-10-CM

## 2023-03-28 DIAGNOSIS — R3129 Other microscopic hematuria: Secondary | ICD-10-CM | POA: Diagnosis not present

## 2023-03-28 DIAGNOSIS — Z0000 Encounter for general adult medical examination without abnormal findings: Secondary | ICD-10-CM | POA: Diagnosis not present

## 2023-03-28 DIAGNOSIS — M79605 Pain in left leg: Secondary | ICD-10-CM | POA: Diagnosis not present

## 2023-03-28 DIAGNOSIS — I10 Essential (primary) hypertension: Secondary | ICD-10-CM | POA: Diagnosis not present

## 2023-03-28 DIAGNOSIS — R7303 Prediabetes: Secondary | ICD-10-CM

## 2023-03-28 DIAGNOSIS — F411 Generalized anxiety disorder: Secondary | ICD-10-CM | POA: Diagnosis not present

## 2023-03-28 DIAGNOSIS — M545 Low back pain, unspecified: Secondary | ICD-10-CM | POA: Diagnosis not present

## 2023-03-28 DIAGNOSIS — G8929 Other chronic pain: Secondary | ICD-10-CM

## 2023-03-28 LAB — LIPID PANEL
Cholesterol: 180 mg/dL (ref 0–200)
HDL: 89.8 mg/dL (ref >39.00)
LDL Cholesterol: 79 mg/dL (ref 0–99)
NonHDL: 90.11
Total CHOL/HDL Ratio: 2
Triglycerides: 57 mg/dL (ref 0.0–149.0)
VLDL: 11.4 mg/dL (ref 0.0–40.0)

## 2023-03-28 LAB — CBC
HCT: 38.9 % (ref 36.0–46.0)
Hemoglobin: 13.4 g/dL (ref 12.0–15.0)
MCHC: 34.3 g/dL (ref 30.0–36.0)
MCV: 94.9 fl (ref 78.0–100.0)
Platelets: 218 10*3/uL (ref 150.0–400.0)
RBC: 4.1 Mil/uL (ref 3.87–5.11)
RDW: 14.8 % (ref 11.5–15.5)
WBC: 5.8 10*3/uL (ref 4.0–10.5)

## 2023-03-28 LAB — COMPREHENSIVE METABOLIC PANEL
ALT: 18 U/L (ref 0–35)
AST: 29 U/L (ref 0–37)
Albumin: 4.2 g/dL (ref 3.5–5.2)
Alkaline Phosphatase: 76 U/L (ref 39–117)
BUN: 20 mg/dL (ref 6–23)
CO2: 30 mEq/L (ref 19–32)
Calcium: 9.4 mg/dL (ref 8.4–10.5)
Chloride: 93 mEq/L — ABNORMAL LOW (ref 96–112)
Creatinine, Ser: 0.75 mg/dL (ref 0.40–1.20)
GFR: 73.34 mL/min (ref >60.00)
Glucose, Bld: 73 mg/dL (ref 70–99)
Potassium: 3.9 mEq/L (ref 3.5–5.1)
Sodium: 129 mEq/L — ABNORMAL LOW (ref 135–145)
Total Bilirubin: 0.6 mg/dL (ref 0.2–1.2)
Total Protein: 7.2 g/dL (ref 6.0–8.3)

## 2023-03-28 LAB — HEMOGLOBIN A1C: Hgb A1c MFr Bld: 5.6 % (ref 4.6–6.5)

## 2023-03-28 NOTE — Assessment & Plan Note (Signed)
Improved.  Continue with Tumeric. Continue activity.

## 2023-03-28 NOTE — Assessment & Plan Note (Signed)
Controlled.  Continue sertraline 50 mg daily. Continue to monitor.

## 2023-03-28 NOTE — Assessment & Plan Note (Signed)
Improving. Following with sports medicine.

## 2023-03-28 NOTE — Assessment & Plan Note (Signed)
Chronic, evident on multiple prior Urinalysis reports. Discussed with patient today.  She declines further work up given her age.

## 2023-03-28 NOTE — Assessment & Plan Note (Signed)
Repeat lipid panel pending.  Discussed the importance of a healthy diet and regular exercise in order for weight loss, and to reduce the risk of further co-morbidity. Continue simvastatin 40 mg daily

## 2023-03-28 NOTE — Assessment & Plan Note (Signed)
Prevnar 20 due, completed today Mammogram scheduled. Bone density scan UTD. Colonoscopy N/A given age.  Discussed the importance of a healthy diet and regular exercise in order for weight loss, and to reduce the risk of further co-morbidity.  Exam stable. Labs pending.  Follow up in 1 year for repeat physical.

## 2023-03-28 NOTE — Patient Instructions (Signed)
Stop by the lab prior to leaving today. I will notify you of your results once received.   It was a pleasure to see you today!  

## 2023-03-28 NOTE — Progress Notes (Signed)
Subjective:    Patient ID: Jenna Hogan, female    DOB: [birth year], 82 y.o.   MRN: [id number]  HPI  Jenna Hogan is a very pleasant 82 y.o. female who presents today for complete physical and follow up of chronic conditions.  Immunizations: -Tetanus: Completed in 2018 -Influenza: Completed this season -Shingles: Completed Shingrix series -Pneumonia: Completed Pneumovax 23 in 2009  Diet: [place].  Exercise: No regular exercise. Active around the house.  Eye exam: Completes annually  Dental exam: Completes semi-annually    Mammogram: Scheduled for May Bone Density Scan: Completed in May 2023  Colonoscopy: N/A given age.    BP Readings from Last 3 Encounters:  03/28/23 136/76  03/04/23 (!) 147/67  01/22/23 138/78      Review of Systems  Constitutional:  Negative for unexpected weight change.  HENT:  Negative for rhinorrhea.   Respiratory:  Negative for cough and shortness of breath.   Cardiovascular:  Negative for chest pain.  Gastrointestinal:  Negative for constipation and diarrhea.  Genitourinary:  Negative for difficulty urinating.  Musculoskeletal:  Positive for arthralgias and back pain.  Skin:  Negative for rash.  Allergic/Immunologic: Negative for environmental allergies.  Neurological:  Negative for dizziness and headaches.  Psychiatric/Behavioral:  The patient is not nervous/anxious.          Past Medical History:  Diagnosis Date   Depression    Essential hypertension    Fatigue    Hypercholesteremia    Urinary incontinence     Social History   Socioeconomic History   Marital status: Widowed    Spouse name: Not on file   Number of children: Not on file   Years of education: Not on file   Highest education level: Not on file  Occupational History   Not on file  Tobacco Use   Smoking status: Never   Smokeless tobacco: Never  Substance and Sexual Activity   Alcohol use: Yes    Comment: social   Drug use: No   Sexual  activity: Not on file  Other Topics Concern   Not on file  Social History Narrative   Married.   Retired. Once worked in Insurance underwriter.   Enjoys crocheting, spending time with family, traveling.    Social Determinants of Health   Financial Resource Strain: Low Risk  (03/07/2023)   Overall Financial Resource Strain (CARDIA)    Difficulty of Paying Living Expenses: Not hard at all  Food Insecurity: No Food Insecurity (03/07/2023)   Hunger Vital Sign    Worried About Running Out of Food in the Last Year: Never true    Ran Out of Food in the Last Year: Never true  Transportation Needs: No Transportation Needs (03/07/2023)   PRAPARE - Hydrologist (Medical): No    Lack of Transportation (Non-Medical): No  Physical Activity: Inactive (03/07/2023)   Exercise Vital Sign    Days of Exercise per Week: 0 days    Minutes of Exercise per Session: 0 min  Stress: No Stress Concern Present (03/07/2023)   [place]    Feeling of Stress : Not at all  Social Connections: Moderately Integrated (03/07/2023)   Social Connection and Isolation Panel [NHANES]    Frequency of Communication with Friends and Family: More than three times a week    Frequency of Social Gatherings with Friends and Family: More than three times a week    Attends Religious Services: More than 4  times per year    Active Member of Clubs or Organizations: Yes    Attends Archivist Meetings: More than 4 times per year    Marital Status: Widowed  Intimate Partner Violence: Not At Risk (03/07/2023)   Humiliation, Afraid, Rape, and Kick questionnaire    Fear of Current or Ex-Partner: No    Emotionally Abused: No    Physically Abused: No    Sexually Abused: No    Past Surgical History:  Procedure Laterality Date   ABDOMINAL HYSTERECTOMY     CARPAL TUNNEL RELEASE  1998, 2002   x2   HERNIA REPAIR Bilateral    MENISCUS REPAIR Left 2011     Family History  Problem Relation Age of Onset   Heart disease Mother    Arthritis Mother    Breast cancer Neg Hx     No Known Allergies  Current Outpatient Medications on File Prior to Visit  Medication Sig Dispense Refill   calcium carbonate (OS-CAL - DOSED IN MG OF ELEMENTAL CALCIUM) 1250 (500 Ca) MG tablet Take 2 tablets by mouth.     Cholecalciferol (D3-1000 PO)      diclofenac Sodium (VOLTAREN) 1 % GEL Apply 2 g topically 4 (four) times daily as needed.     Fexofenadine HCl (ALLEGRA PO) Take 1 tablet by mouth daily as needed.     metoprolol tartrate (LOPRESSOR) 50 MG tablet TAKE 1 TABLET (50 MG TOTAL) BY MOUTH 2 (TWO) TIMES DAILY. FOR BLOOD PRESSURE. 180 tablet 0   Multiple Vitamins-Minerals (MULTIVITAMIN ADULT EXTRA C PO)      Multiple Vitamins-Minerals (PRESERVISION AREDS 2 PO) Take 2 capsules by mouth daily. Take one in the morning and one at night.     naproxen sodium (ALEVE) 220 MG tablet Take 440 mg by mouth daily as needed.     sertraline (ZOLOFT) 50 MG tablet TAKE 1 TABLET BY MOUTH EVERY DAY FOR ANXIETY 90 tablet 3   simvastatin (ZOCOR) 40 MG tablet TAKE 1 TABLET (40 MG TOTAL) BY MOUTH EVERY EVENING. FOR CHOLESTEROL. 90 tablet 0   ondansetron (ZOFRAN) 4 MG tablet Take 1 tablet (4 mg total) by mouth every 8 (eight) hours as needed for nausea or vomiting. (Patient not taking: Reported on 03/07/2023) 12 tablet 0   No current facility-administered medications on file prior to visit.    BP 136/76   Pulse (!) 59   Temp (!) 97.1 F (36.2 C) (Temporal)   Ht 5' 2.5" (1.588 m)   Wt 157 lb (71.2 kg)   SpO2 98%   BMI 28.26 kg/m  Objective:   Physical Exam HENT:     Right Ear: Tympanic membrane and ear canal normal.     Left Ear: Tympanic membrane and ear canal normal.     Nose: Nose normal.  Eyes:     Conjunctiva/sclera: Conjunctivae normal.     Pupils: Pupils are equal, round, and reactive to light.  Neck:     Thyroid: No thyromegaly.  Cardiovascular:     Rate and  Rhythm: Normal rate and regular rhythm.     Heart sounds: No murmur heard. Pulmonary:     Effort: Pulmonary effort is normal.     Breath sounds: Normal breath sounds. No rales.  Abdominal:     General: Bowel sounds are normal.     Palpations: Abdomen is soft.     Tenderness: There is no abdominal tenderness.  Musculoskeletal:     Cervical back: Neck supple.     Comments:  Decrease in ROM to left knee. Ambulates well without assistance.  Lymphadenopathy:     Cervical: No cervical adenopathy.  Skin:    General: Skin is warm and dry.     Findings: No rash.  Neurological:     Mental Status: She is alert and oriented to person, place, and time.     Cranial Nerves: No cranial nerve deficit.     Deep Tendon Reflexes: Reflexes are normal and symmetric.  Psychiatric:        Mood and Affect: Mood normal.           Assessment & Plan:  Preventative health care Assessment & Plan: Prevnar 20 due, completed today Mammogram scheduled. Bone density scan UTD. Colonoscopy N/A given age.  Discussed the importance of a healthy diet and regular exercise in order for weight loss, and to reduce the risk of further co-morbidity.  Exam stable. Labs pending.  Follow up in 1 year for repeat physical.    Essential hypertension Assessment & Plan: Controlled.  Continue metoprolol tartrate 50 mg BID.  Orders: -     Comprehensive metabolic panel -     CBC  Microscopic hematuria Assessment & Plan: Chronic, evident on multiple prior Urinalysis reports. Discussed with patient today.  She declines further work up given her age.     Chronic bilateral low back pain, unspecified whether sciatica present Assessment & Plan: Improved.  Continue with Tumeric. Continue activity.    GAD (generalized anxiety disorder) Assessment & Plan: Controlled.  Continue sertraline 50 mg daily. Continue to monitor.    Hyperlipidemia, unspecified hyperlipidemia type Assessment & Plan: Repeat  lipid panel pending.  Discussed the importance of a healthy diet and regular exercise in order for weight loss, and to reduce the risk of further co-morbidity. Continue simvastatin 40 mg daily  Orders: -     Lipid panel -     Hemoglobin A1c  Lower extremity pain, anterior, left Assessment & Plan: Improving. Following with sports medicine.   Prediabetes Assessment & Plan: Repeat A1C pending.  Discussed the importance of a healthy diet and regular exercise in order for weight loss, and to reduce the risk of further co-morbidity.   Orders: -     Hemoglobin A1c        Pleas Koch, NP

## 2023-03-28 NOTE — Assessment & Plan Note (Signed)
Repeat A1C pending.   Discussed the importance of a healthy diet and regular exercise in order for weight loss, and to reduce the risk of further co-morbidity.  

## 2023-03-28 NOTE — Assessment & Plan Note (Signed)
Controlled.  Continue metoprolol tartrate 50 mg BID. 

## 2023-04-09 ENCOUNTER — Other Ambulatory Visit (INDEPENDENT_AMBULATORY_CARE_PROVIDER_SITE_OTHER): Payer: Medicare HMO

## 2023-04-09 DIAGNOSIS — L578 Other skin changes due to chronic exposure to nonionizing radiation: Secondary | ICD-10-CM | POA: Diagnosis not present

## 2023-04-09 DIAGNOSIS — L298 Other pruritus: Secondary | ICD-10-CM | POA: Diagnosis not present

## 2023-04-09 DIAGNOSIS — I10 Essential (primary) hypertension: Secondary | ICD-10-CM | POA: Diagnosis not present

## 2023-04-09 DIAGNOSIS — L538 Other specified erythematous conditions: Secondary | ICD-10-CM | POA: Diagnosis not present

## 2023-04-09 DIAGNOSIS — L820 Inflamed seborrheic keratosis: Secondary | ICD-10-CM | POA: Diagnosis not present

## 2023-04-09 DIAGNOSIS — L821 Other seborrheic keratosis: Secondary | ICD-10-CM | POA: Diagnosis not present

## 2023-04-09 LAB — BASIC METABOLIC PANEL
BUN: 20 mg/dL (ref 6–23)
CO2: 31 mEq/L (ref 19–32)
Calcium: 9.1 mg/dL (ref 8.4–10.5)
Chloride: 96 mEq/L (ref 96–112)
Creatinine, Ser: 0.66 mg/dL (ref 0.40–1.20)
GFR: 80.79 mL/min (ref >60.00)
Glucose, Bld: 85 mg/dL (ref 70–99)
Potassium: 4 mEq/L (ref 3.5–5.1)
Sodium: 133 mEq/L — ABNORMAL LOW (ref 135–145)

## 2023-04-12 ENCOUNTER — Other Ambulatory Visit: Payer: Self-pay | Admitting: Family Medicine

## 2023-04-12 DIAGNOSIS — F411 Generalized anxiety disorder: Secondary | ICD-10-CM

## 2023-04-23 DIAGNOSIS — L538 Other specified erythematous conditions: Secondary | ICD-10-CM | POA: Diagnosis not present

## 2023-04-23 DIAGNOSIS — L820 Inflamed seborrheic keratosis: Secondary | ICD-10-CM | POA: Diagnosis not present

## 2023-04-24 ENCOUNTER — Other Ambulatory Visit: Payer: Self-pay | Admitting: Family Medicine

## 2023-04-24 DIAGNOSIS — E785 Hyperlipidemia, unspecified: Secondary | ICD-10-CM

## 2023-05-06 ENCOUNTER — Ambulatory Visit
Admission: RE | Admit: 2023-05-06 | Discharge: 2023-05-06 | Disposition: A | Discharge status: Home / Self Care | Payer: Medicare HMO | Source: Ambulatory Visit | Attending: Family Medicine | Admitting: Family Medicine

## 2023-05-06 DIAGNOSIS — Z1231 Encounter for screening mammogram for malignant neoplasm of breast: Secondary | ICD-10-CM

## 2023-06-03 ENCOUNTER — Other Ambulatory Visit: Payer: Self-pay | Admitting: Primary Care

## 2023-06-03 DIAGNOSIS — I10 Essential (primary) hypertension: Secondary | ICD-10-CM

## 2023-06-04 DIAGNOSIS — H524 Presbyopia: Secondary | ICD-10-CM | POA: Diagnosis not present

## 2023-06-04 DIAGNOSIS — H5203 Hypermetropia, bilateral: Secondary | ICD-10-CM | POA: Diagnosis not present

## 2023-06-04 DIAGNOSIS — H52223 Regular astigmatism, bilateral: Secondary | ICD-10-CM | POA: Diagnosis not present

## 2023-08-04 NOTE — Progress Notes (Unsigned)
    Ambrielle Kington T. Amamda Curbow, MD, CAQ Sports Medicine [facility] [address] [place], [address]  Phone: [phone]  FAX: [phone]  Jenna Hogan - 82 y.o. female  MRN [id number]  Date of Birth: 29-Oct-1941  Date: 08/05/2023  PCP: Doreene Nest, NP  Referral: Doreene Nest, NP  No chief complaint on file.  Subjective:   Jenna Hogan is a 82 y.o. very pleasant female patient with There is no height or weight on file to calculate BMI. who presents with the following:  Patient presents for follow-up on bilateral knee osteoarthritis.  Did see her almost 6 months ago, at that point we did do bilateral intra-articular injections with corticosteroid.    Review of Systems is noted in the HPI, as appropriate  Objective:   There were no vitals taken for this visit.  GEN: No acute distress; alert,appropriate. PULM: Breathing comfortably in no respiratory distress PSYCH: Normally interactive.   Laboratory and Imaging Data:  Assessment and Plan:   ***

## 2023-08-05 ENCOUNTER — Ambulatory Visit (INDEPENDENT_AMBULATORY_CARE_PROVIDER_SITE_OTHER): Payer: Medicare HMO | Admitting: Family Medicine

## 2023-08-05 ENCOUNTER — Encounter: Payer: Self-pay | Admitting: Family Medicine

## 2023-08-05 VITALS — BP 162/80 | HR 64 | Temp 97.7°F | Ht 62.5 in | Wt 164.1 lb

## 2023-08-05 DIAGNOSIS — M1711 Unilateral primary osteoarthritis, right knee: Secondary | ICD-10-CM

## 2023-08-05 DIAGNOSIS — M170 Bilateral primary osteoarthritis of knee: Secondary | ICD-10-CM

## 2023-08-05 MED ORDER — HYALURONAN 88 MG/4ML IX SOSY
88.0000 mg | PREFILLED_SYRINGE | Freq: Once | INTRA_ARTICULAR | Status: AC
Start: 2023-08-05 — End: 2023-08-05
  Administered 2023-08-05: 88 mg via INTRA_ARTICULAR

## 2023-08-18 NOTE — Progress Notes (Unsigned)
    Kaylah Chiasson T. Velina Drollinger, MD, CAQ Sports Medicine [facility] at [facility] [address] [place], [address]  Phone: [phone]  FAX: [phone]  Jenna Hogan - 82 y.o. female  MRN [id number]  Date of Birth: April 08, 1941  Date: 08/19/2023  PCP: Doreene Nest, NP  Referral: Doreene Nest, NP  No chief complaint on file.  Subjective:   Jenna Hogan is a 82 y.o. very pleasant female patient with There is no height or weight on file to calculate BMI. who presents with the following:  She is a very pleasant patient, and I recall her well from some recent office visits about arthritis.  The last time I saw her several weeks ago, we did do a Monovisc injection of the right knee.  She presents today with follow-up on left-sided knee osteoarthritis.    Review of Systems is noted in the HPI, as appropriate  Objective:   There were no vitals taken for this visit.  GEN: No acute distress; alert,appropriate. PULM: Breathing comfortably in no respiratory distress PSYCH: Normally interactive.   Laboratory and Imaging Data:  Assessment and Plan:   ***

## 2023-08-19 ENCOUNTER — Encounter: Payer: Self-pay | Admitting: Family Medicine

## 2023-08-19 ENCOUNTER — Ambulatory Visit (INDEPENDENT_AMBULATORY_CARE_PROVIDER_SITE_OTHER): Payer: Medicare HMO | Admitting: Family Medicine

## 2023-08-19 VITALS — BP 132/70 | HR 70 | Temp 97.8°F | Ht 62.5 in | Wt 165.2 lb

## 2023-08-19 DIAGNOSIS — M1712 Unilateral primary osteoarthritis, left knee: Secondary | ICD-10-CM | POA: Diagnosis not present

## 2023-08-19 MED ORDER — HYALURONAN 88 MG/4ML IX SOSY
88.0000 mg | PREFILLED_SYRINGE | Freq: Once | INTRA_ARTICULAR | Status: AC
  Administered 2023-08-19: 88 mg via INTRA_ARTICULAR

## 2023-08-27 DIAGNOSIS — L820 Inflamed seborrheic keratosis: Secondary | ICD-10-CM | POA: Diagnosis not present

## 2023-08-27 DIAGNOSIS — L538 Other specified erythematous conditions: Secondary | ICD-10-CM | POA: Diagnosis not present

## 2023-08-27 DIAGNOSIS — L298 Other pruritus: Secondary | ICD-10-CM | POA: Diagnosis not present

## 2023-09-19 ENCOUNTER — Telehealth: Payer: Self-pay

## 2023-09-19 NOTE — Telephone Encounter (Signed)
Pt walked in; pt was at [employer] and bent over to get something off shelf and pt said had sharp stabbing pain in lt upper leg and hip with pain level of 10. Episode lasted 5 - 10 mins until pt got to her car and sat down. Pt said for 2 wks on and off having lt upper leg and hip pain. Today the pain has been happening more often and pain level is higher today. Pt said has not had recent fall or injury. No pain anywhere else. No available appts at [facility] or [facility]. Now BP 140/82 LA reg cuff sitting; T 97 P 61 and oxygen level is 93% room air.Pt does not want to go to UC. Scheduled pt appt with Dr Ermalene Searing on 09/20/23 at 9 am. Pt asked if could see Mayra Reel NP, Dr Patsy Lager or Dr Ermalene Searing.  UC & ED precautions were given if pts condition changed or worsened. Right now pt is not in pain pt is presently sitting in w/c. I took pt to car in w/c and pt is going to rest this evening. Sending note to Dr Ermalene Searing and Lorain Childes to Allayne Gitelman NP as PCP.

## 2023-09-19 NOTE — Telephone Encounter (Signed)
Noted  

## 2023-09-20 ENCOUNTER — Encounter: Payer: Self-pay | Admitting: Family Medicine

## 2023-09-20 ENCOUNTER — Ambulatory Visit (INDEPENDENT_AMBULATORY_CARE_PROVIDER_SITE_OTHER): Payer: Medicare HMO | Admitting: Family Medicine

## 2023-09-20 VITALS — BP 160/72 | HR 58 | Temp 98.6°F | Ht 62.5 in | Wt 164.4 lb

## 2023-09-20 DIAGNOSIS — M5432 Sciatica, left side: Secondary | ICD-10-CM | POA: Diagnosis not present

## 2023-09-20 DIAGNOSIS — M25552 Pain in left hip: Secondary | ICD-10-CM | POA: Diagnosis not present

## 2023-09-20 MED ORDER — PREDNISONE 10 MG PO TABS: ORAL_TABLET | ORAL | 0 refills | Status: DC

## 2023-09-20 NOTE — Progress Notes (Signed)
Patient ID: Jenna Hogan, female    DOB: [birth year], 82 y.o.   MRN: [id number]  This visit was conducted in person.  BP (!) 160/72 (BP Location: Left Arm, Patient Position: Sitting, Cuff Size: Large)   Pulse (!) 58   Temp 98.6 F (37 C) (Temporal)   Ht 5' 2.5" (1.588 m)   Wt 164 lb 6 oz (74.6 kg)   SpO2 98%   BMI 29.59 kg/m    CC:  Chief Complaint  Patient presents with   Leg Pain    Upper Left Leg-Seen by Dr. Patsy Lager 8/5 and 8/19   Hip Pain    Left    Subjective:   HPI: [name] is a 82 y.o. female presenting on 09/20/2023 for Leg Pain (Upper Left Leg-Seen by Dr. Patsy Lager 8/5 and 8/19) and Hip Pain (Left)  Reviewed recent office visits from Dr. Patsy Lager, sports medicine from August 5 and August 19. She has been really saving hyaluronic acid injections in bilateral knees for osteoarthritis on these dates.  Has not helped much with [pain.   Now in last 2 weeks she has been having pain in left anterior hip,  better with sitting. Sudden pain in left anterior hip when bending over and standing up today.  No numbness, no weakness.   No fall, no change in activity.    She has used ibuprofen 600 mg yesterday without relief.   Has history of DDD in low back  Has history of of osteoporosis.      Relevant past medical, surgical, family and social history reviewed and updated as indicated. Interim medical history since our last visit reviewed. Allergies and medications reviewed and updated. Outpatient Medications Prior to Visit  Medication Sig Dispense Refill   calcium carbonate (OS-CAL - DOSED IN MG OF ELEMENTAL CALCIUM) 1250 (500 Ca) MG tablet Take 2 tablets by mouth.     Cholecalciferol (D3-1000 PO)      diclofenac Sodium (VOLTAREN) 1 % GEL Apply 2 g topically 4 (four) times daily as needed.     Fexofenadine HCl (ALLEGRA PO) Take 1 tablet by mouth daily as needed.     metoprolol tartrate (LOPRESSOR) 50 MG tablet TAKE 1 TABLET (50 MG TOTAL) BY MOUTH 2 (TWO)  TIMES DAILY. FOR BLOOD PRESSURE. 180 tablet 2   Multiple Vitamins-Minerals (MULTIVITAMIN ADULT EXTRA C PO)      Multiple Vitamins-Minerals (PRESERVISION AREDS 2 PO) Take 2 capsules by mouth daily. Take one in the morning and one at night.     naproxen sodium (ALEVE) 220 MG tablet Take 440 mg by mouth daily as needed.     Probiotic Product (PROBIOTIC PO) Take 1 Capful by mouth daily.     sertraline (ZOLOFT) 50 MG tablet TAKE 1 TABLET BY MOUTH EVERY DAY FOR ANXIETY 90 tablet 3   simvastatin (ZOCOR) 40 MG tablet TAKE 1 TABLET (40 MG TOTAL) BY MOUTH EVERY EVENING. FOR CHOLESTEROL. 90 tablet 2   No facility-administered medications prior to visit.     Per HPI unless specifically indicated in ROS section below Review of Systems  Constitutional:  Negative for fatigue and fever.  HENT:  Negative for congestion.   Eyes:  Negative for pain.  Respiratory:  Negative for cough and shortness of breath.   Cardiovascular:  Negative for chest pain, palpitations and leg swelling.  Gastrointestinal:  Negative for abdominal pain.  Genitourinary:  Negative for dysuria and vaginal bleeding.  Musculoskeletal:  Positive for arthralgias and back pain.  Neurological:  Negative  for syncope, light-headedness and headaches.  Psychiatric/Behavioral:  Negative for dysphoric mood.    Objective:  BP (!) 160/72 (BP Location: Left Arm, Patient Position: Sitting, Cuff Size: Large)   Pulse (!) 58   Temp 98.6 F (37 C) (Temporal)   Ht 5' 2.5" (1.588 m)   Wt 164 lb 6 oz (74.6 kg)   SpO2 98%   BMI 29.59 kg/m   Wt Readings from Last 3 Encounters:  09/20/23 164 lb 6 oz (74.6 kg)  08/19/23 165 lb 4 oz (75 kg)  08/05/23 164 lb 2 oz (74.4 kg)      Physical Exam Constitutional:      General: She is not in acute distress.    Appearance: Normal appearance. She is well-developed. She is not ill-appearing or toxic-appearing.  HENT:     Head: Normocephalic.     Right Ear: Hearing, tympanic membrane, ear canal and  external ear normal. Tympanic membrane is not erythematous, retracted or bulging.     Left Ear: Hearing, tympanic membrane, ear canal and external ear normal. Tympanic membrane is not erythematous, retracted or bulging.     Nose: No mucosal edema or rhinorrhea.     Right Sinus: No maxillary sinus tenderness or frontal sinus tenderness.     Left Sinus: No maxillary sinus tenderness or frontal sinus tenderness.     Mouth/Throat:     Mouth: Oropharynx is clear and moist and mucous membranes are normal.     Pharynx: Uvula midline.  Eyes:     General: Lids are normal. Lids are everted, no foreign bodies appreciated.     Extraocular Movements: EOM normal.     Conjunctiva/sclera: Conjunctivae normal.     Pupils: Pupils are equal, round, and reactive to light.  Neck:     Thyroid: No thyroid mass or thyromegaly.     Vascular: No carotid bruit.     Trachea: Trachea normal.  Cardiovascular:     Rate and Rhythm: Normal rate and regular rhythm.     Pulses: Normal pulses.     Heart sounds: Normal heart sounds, S1 normal and S2 normal. No murmur heard.    No friction rub. No gallop.  Pulmonary:     Effort: Pulmonary effort is normal. No tachypnea or respiratory distress.     Breath sounds: Normal breath sounds. No decreased breath sounds, wheezing, rhonchi or rales.  Abdominal:     General: Bowel sounds are normal.     Palpations: Abdomen is soft.     Tenderness: There is no abdominal tenderness.  Musculoskeletal:     Cervical back: Normal, normal range of motion and neck supple.     Thoracic back: Normal.     Lumbar back: No spasms, tenderness or bony tenderness. Normal range of motion. Positive left straight leg raise test.     Right hip: No tenderness or bony tenderness. Normal range of motion.     Left hip: No tenderness or bony tenderness. Normal range of motion.     Comments: Good range of motion of bilateral hips for 82 year old patient.  No tenderness to palpation over groin  muscles.  When patient trying to lay legs flat she had sharp sudden pain in her left buttock and posterior thigh, worsening pain with straight leg raise.  Skin:    General: Skin is warm, dry and intact.     Findings: No rash.  Neurological:     Mental Status: She is alert.  Psychiatric:        Mood  and Affect: Mood is not anxious or depressed.        Speech: Speech normal.        Behavior: Behavior normal. Behavior is cooperative.        Thought Content: Thought content normal.        Cognition and Memory: Cognition and memory normal.        Judgment: Judgment normal.       Results for orders placed or performed in visit on 04/09/23  Basic metabolic panel  Result Value Ref Range   Sodium 133 (L) 135 - 145 mEq/L   Potassium 4.0 3.5 - 5.1 mEq/L   Chloride 96 96 - 112 mEq/L   CO2 31 19 - 32 mEq/L   Glucose, Bld 85 70 - 99 mg/dL   BUN 20 6 - 23 mg/dL   Creatinine, Ser 0.93 0.40 - 1.20 mg/dL   GFR 23.55 >73.22 mL/min   Calcium 9.1 8.4 - 10.5 mg/dL    Assessment and Plan  Acute hip pain, left Assessment & Plan: Hip and pelvis evaluation within normal range on exam.  Although initially localizing pain to left anterior hip it now seems to be more related to attic distribution.  No fall and no clear red flag indication for imaging at this time.   Left sided sciatica Assessment & Plan: Acute, history of degenerative disc disease and low back and osteoarthritis and low back likely causing sciatic irritation. Recommend starting heat, home physical therapy and prednisone taper. No focal vertebral tenderness present no indication for x-ray. If symptoms not improving as expected though encouraged patient to return for consideration of imaging given her age and osteoporosis.   Other orders -     predniSONE; 3 tabs by mouth daily x 3 days, then 2 tabs by mouth daily x 2 days then 1 tab by mouth daily x 2 days  Dispense: 15 tablet; Refill: 0    No follow-ups on file.   Kerby Nora, MD

## 2023-09-20 NOTE — Patient Instructions (Signed)
Can use heat back and hip. Start prednisone taper. Start home physical therapy. Follow-up in 2 weeks with PCP if not improving as expected.

## 2023-09-20 NOTE — Assessment & Plan Note (Signed)
Acute, history of degenerative disc disease and low back and osteoarthritis and low back likely causing sciatic irritation. Recommend starting heat, home physical therapy and prednisone taper. No focal vertebral tenderness present no indication for x-ray. If symptoms not improving as expected though encouraged patient to return for consideration of imaging given her age and osteoporosis.

## 2023-09-20 NOTE — Assessment & Plan Note (Addendum)
Hip and pelvis evaluation within normal range on exam.  Although initially localizing pain to left anterior hip it now seems to be more related to attic distribution.  No fall and no clear red flag indication for imaging at this time.

## 2023-09-27 ENCOUNTER — Ambulatory Visit: Payer: Medicare HMO | Admitting: Primary Care

## 2023-10-08 ENCOUNTER — Ambulatory Visit (INDEPENDENT_AMBULATORY_CARE_PROVIDER_SITE_OTHER): Payer: Medicare HMO | Admitting: Primary Care

## 2023-10-08 ENCOUNTER — Ambulatory Visit (INDEPENDENT_AMBULATORY_CARE_PROVIDER_SITE_OTHER)
Admission: RE | Admit: 2023-10-08 | Discharge: 2023-10-08 | Disposition: A | Discharge status: Home / Self Care | Payer: Medicare HMO | Source: Ambulatory Visit | Attending: Primary Care | Admitting: Primary Care

## 2023-10-08 ENCOUNTER — Encounter: Payer: Self-pay | Admitting: Primary Care

## 2023-10-08 VITALS — BP 124/82 | HR 74 | Temp 97.8°F | Ht 62.5 in | Wt 168.5 lb

## 2023-10-08 DIAGNOSIS — M79605 Pain in left leg: Secondary | ICD-10-CM

## 2023-10-08 DIAGNOSIS — M25552 Pain in left hip: Secondary | ICD-10-CM | POA: Diagnosis not present

## 2023-10-08 DIAGNOSIS — Z23 Encounter for immunization: Secondary | ICD-10-CM | POA: Diagnosis not present

## 2023-10-08 DIAGNOSIS — M160 Bilateral primary osteoarthritis of hip: Secondary | ICD-10-CM | POA: Diagnosis not present

## 2023-10-08 MED ORDER — TIZANIDINE HCL 4 MG PO TABS
4.0000 mg | ORAL_TABLET | Freq: Three times a day (TID) | ORAL | 0 refills | Status: DC | PRN
Start: 2023-10-08 — End: 2024-02-12

## 2023-10-08 NOTE — Assessment & Plan Note (Addendum)
Uncontrolled.  Differentials include osteoarthritis/bursitis.  Consider vascular cause.  Stop Aleve daily.  Start Tizanidine (Zanaflex) 4 mg HS PRN. Precautions reviewed.   X-ray (s) ordered, pending.   Physical therapy referral in place. Phone number provided to patient to call and schedule appointment.   Will be in touch with results of x-ray.   I evaluated patient, was consulted regarding treatment, and agree with assessment and plan per Tenna Delaine, RN, DNP student.   Mayra Reel, NP-C

## 2023-10-08 NOTE — Patient Instructions (Addendum)
Complete xrays prior to leaving today. I will notify you of your results once received.  Stop taking Aleve daily.  Start Tizanidine (Zanaflex) 1 tablet at bedtime as needed for hip pain. This medication can make you drowsy.   Call physical therapy to make an appointment:  Sent to [facility] Address: [address] [place] [address] Phone: [phone] Fax: [phone]

## 2023-10-08 NOTE — Progress Notes (Signed)
Established Patient Office Visit  Subjective   Patient ID: Jenna Hogan, female    DOB: 03-06-1941  Age: 82 y.o. MRN: [id number]  Chief Complaint  Patient presents with   Follow-up    Left leg pain is still present, not any worse than it was when she was originally seen.    HPI  Jenna Hogan is a pleasant 82 y.o. female with a history of degenerative disc disease, osteoarthritis, osteoporosis, chronic knee pain, who presents today for follow up of hip pain.  She was evaluated in office on 8/5 and 8/19 by Hannah Beat MD for OA in the knees. She received Hyaluronan intra-articular injections at both visits. Her pain in her knees is minimally improved. She declines surgical intervention at this time.   She was seen by Kerby Nora MD on 9/20 for acute left hip pain. She was prescribed a prednisone taper and home physical therapy services were ordered. She completed the course of steroids. She did not receive a call to schedule home health PT. She was not able to perform the exercises Dr. Ermalene Searing gave her since lying on the ground is too painful.   She was working in the yard digging with a heavy shovel in late August when the hip pain started and then gradually worsening to excruciating left hip pain, which prompted her visit on 9/20. Her left hip pain is only mildly improved since her last visit and continues to be aggravating and impacting her activities of daily living. She has been using a cane to walk since her 9/20 visit. Sitting does relieve pain. She has been taking 2 Aleve tablets every day for the past 2 weeks which she feels is not helping. Ice and rest mildly improves her pain.     Review of Systems  Constitutional:  Negative for malaise/fatigue.  Eyes:  Negative for blurred vision and double vision.  Respiratory:  Negative for shortness of breath.   Cardiovascular:  Negative for chest pain.  Gastrointestinal:  Negative for abdominal pain, constipation and diarrhea.   Musculoskeletal:  Positive for myalgias. Negative for falls.       Left hip pain  Neurological:  Negative for dizziness, sensory change and headaches.     Objective:    Vitals:   10/08/23 1411  BP: 124/82  Pulse: 74  Temp: 97.8 F (36.6 C)  SpO2: 97%   BP Readings from Last 3 Encounters:  10/08/23 124/82  09/20/23 (!) 160/72  08/19/23 132/70   Wt Readings from Last 3 Encounters:  10/08/23 168 lb 7.7 oz (76.4 kg)  09/20/23 164 lb 6 oz (74.6 kg)  08/19/23 165 lb 4 oz (75 kg)      Physical Exam Cardiovascular:     Rate and Rhythm: Normal rate and regular rhythm.  Pulmonary:     Effort: Pulmonary effort is normal.     Breath sounds: Normal breath sounds.  Musculoskeletal:        General: Tenderness present.     Comments: Left hip tenderness  Skin:    General: Skin is warm and dry.  Neurological:     General: No focal deficit present.     Mental Status: She is alert.    No results found for any visits on 10/08/23.    The ASCVD Risk score (Arnett DK, et al., 2019) failed to calculate for the following reasons:   The 2019 ASCVD risk score is only valid for ages 47 to 46    Assessment & Plan:  Problem List Items Addressed This Visit       Other   Acute hip pain, left - Primary    Uncontrolled.  Stop Aleve daily.  Start Tizanidine (Zanaflex) 4 mg HS PRN. Precautions reviewed.   X-ray (s) ordered, pending.   Physical therapy referral in place. Phone number provided to patient to call and schedule appointment.   Will be in touch with results of x-ray. She will update if no improvement.       Relevant Medications   tiZANidine (ZANAFLEX) 4 MG tablet   Other Relevant Orders   DG HIP UNILAT WITH PELVIS 2-3 VIEWS LEFT   Other Visit Diagnoses     Encounter for immunization       Relevant Orders   Flu Vaccine Trivalent High Dose (Fluad) (Completed)       No follow-ups on file.    Jenna Mccreedy, RN

## 2023-10-08 NOTE — Assessment & Plan Note (Signed)
Unclear cause, could be MSK, need to consider vascular cause.  Consider vascular US of left lower extremity vs abdominal x-ray versus CT abdomen/pelvis. Await pelvic plain films first.

## 2023-10-08 NOTE — Progress Notes (Signed)
Subjective:    Patient ID: Jenna Hogan, female    DOB: [birth year], 82 y.o.   MRN: [id number]  HPI  Jenna Hogan is a very pleasant 82 y.o. female with a history of hypertension, left sided back pain, hyperlipidemia, chronic back pain, urinary incontinence, prediabetes who presents today to discuss lower extremity pain.   Evaluated by Dr. Ermalene Searing on 09/20/2023 for a 2-week history of left anterior hip pain that is worse with bending forward and standing from a seated position.  Improved with sitting.  Also following with sports medicine for chronic knee pain, has undergone hyaluronic acid injections, no significant treatment.  During her visit with Dr. Ermalene Searing there were no red flags on exam.  She was prescribed prednisone taper and referred to home physical therapy.  She is here for follow-up today.  Today she discusses that her left hip and anterior upper lower extremity pain is about the same. Her pain continues to occur mostly with rising from a seated position and standing. Improved with sitting. She completed her course of prednisone. She never connected with physical therapy as there has been no contact.   She also discusses left lower quadrant abdominal/left upper pelvic discomfort that began about 2 months ago.  She denies changes in her bowels, nausea, vomiting.  She notices her pain intermittently, specifically at night when in bed and sometimes while walking.  She underwent CT angio abdomen/pelvis in 2016 which revealed large colonic stool burden without obstruction, scattered minimal to moderate amount of aortic atherosclerosis, overall no hemodynamically significant stenosis involving multiple arteries.  There was minimal eccentric mixed calcified and noncalcified plaque to the bilateral common iliac arteries without hemodynamically significant stenosis.  There is no evidence of mesenteric ischemia.  She's been taking Aleve daily with minimal help. She has not completed xrays  of the left hip. She denies falls/trauma, numbness/tingling, lower back pain.   Review of Systems  Respiratory:  Negative for shortness of breath.   Cardiovascular:  Negative for chest pain.  Musculoskeletal:  Positive for arthralgias.  Neurological:  Negative for numbness.         Past Medical History:  Diagnosis Date   Depression    Essential hypertension    Fatigue    Hypercholesteremia    Urinary incontinence     Social History   Socioeconomic History   Marital status: Widowed    Spouse name: Not on file   Number of children: Not on file   Years of education: Not on file   Highest education level: Not on file  Occupational History   Not on file  Tobacco Use   Smoking status: Never   Smokeless tobacco: Never  Substance and Sexual Activity   Alcohol use: Yes    Comment: social   Drug use: No   Sexual activity: Not on file  Other Topics Concern   Not on file  Social History Narrative   Married.   Retired. Once worked in Community education officer.   Enjoys crocheting, spending time with family, traveling.    Social Determinants of Health   Financial Resource Strain: Low Risk  (03/07/2023)   Overall Financial Resource Strain (CARDIA)    Difficulty of Paying Living Expenses: Not hard at all  Food Insecurity: No Food Insecurity (03/07/2023)   Hunger Vital Sign    Worried About Running Out of Food in the Last Year: Never true    Ran Out of Food in the Last Year: Never true  Transportation Needs: No Transportation Needs (03/07/2023)  PRAPARE - Administrator, Civil Service (Medical): No    Lack of Transportation (Non-Medical): No  Physical Activity: Inactive (03/07/2023)   Exercise Vital Sign    Days of Exercise per Week: 0 days    Minutes of Exercise per Session: 0 min  Stress: No Stress Concern Present (03/07/2023)   [employer] of Occupational Health - Occupational Stress Questionnaire    Feeling of Stress : Not at all  Social Connections: Moderately  Integrated (03/07/2023)   Social Connection and Isolation Panel [NHANES]    Frequency of Communication with Friends and Family: More than three times a week    Frequency of Social Gatherings with Friends and Family: More than three times a week    Attends Religious Services: More than 4 times per year    Active Member of [employer] or Organizations: Yes    Attends Banker Meetings: More than 4 times per year    Marital Status: Widowed  Intimate Partner Violence: Not At Risk (03/07/2023)   Humiliation, Afraid, Rape, and Kick questionnaire    Fear of Current or Ex-Partner: No    Emotionally Abused: No    Physically Abused: No    Sexually Abused: No    Past Surgical History:  Procedure Laterality Date   ABDOMINAL HYSTERECTOMY     CARPAL TUNNEL RELEASE  1998, 2002   x2   HERNIA REPAIR Bilateral    MENISCUS REPAIR Left 2011    Family History  Problem Relation Age of Onset   Heart disease Mother    Arthritis Mother    Breast cancer Neg Hx     No Known Allergies  Current Outpatient Medications on File Prior to Visit  Medication Sig Dispense Refill   calcium carbonate (OS-CAL - DOSED IN MG OF ELEMENTAL CALCIUM) 1250 (500 Ca) MG tablet Take 2 tablets by mouth.     Cholecalciferol (D3-1000 PO)      diclofenac Sodium (VOLTAREN) 1 % GEL Apply 2 g topically 4 (four) times daily as needed.     Fexofenadine HCl (ALLEGRA PO) Take 1 tablet by mouth daily as needed.     metoprolol tartrate (LOPRESSOR) 50 MG tablet TAKE 1 TABLET (50 MG TOTAL) BY MOUTH 2 (TWO) TIMES DAILY. FOR BLOOD PRESSURE. 180 tablet 2   Multiple Vitamins-Minerals (MULTIVITAMIN ADULT EXTRA C PO)      Multiple Vitamins-Minerals (PRESERVISION AREDS 2 PO) Take 2 capsules by mouth daily. Take one in the morning and one at night.     naproxen sodium (ALEVE) 220 MG tablet Take 440 mg by mouth daily as needed.     Probiotic Product (PROBIOTIC PO) Take 1 Capful by mouth daily.     sertraline (ZOLOFT) 50 MG tablet TAKE 1  TABLET BY MOUTH EVERY DAY FOR ANXIETY 90 tablet 3   simvastatin (ZOCOR) 40 MG tablet TAKE 1 TABLET (40 MG TOTAL) BY MOUTH EVERY EVENING. FOR CHOLESTEROL. 90 tablet 2   No current facility-administered medications on file prior to visit.    BP 124/82   Pulse 74   Temp 97.8 F (36.6 C) (Temporal)   Ht 5' 2.5" (1.588 m)   Wt 168 lb 7.7 oz (76.4 kg)   SpO2 97%   BMI 30.32 kg/m  Objective:   Physical Exam Cardiovascular:     Rate and Rhythm: Normal rate and regular rhythm.  Pulmonary:     Effort: Pulmonary effort is normal.     Breath sounds: Normal breath sounds.  Musculoskeletal:  Cervical back: Neck supple.     Right hip: Normal range of motion.     Left hip: Normal range of motion.     Comments: Ambulates with limp and uses cane. Difficulty getting up and down from exam table.  Skin:    General: Skin is warm and dry.  Neurological:     Mental Status: She is alert and oriented to person, place, and time.  Psychiatric:        Mood and Affect: Mood normal.           Assessment & Plan:  Acute hip pain, left Assessment & Plan: Uncontrolled.  Differentials include osteoarthritis/bursitis.  Consider vascular cause.  Stop Aleve daily.  Start Tizanidine (Zanaflex) 4 mg HS PRN. Precautions reviewed.   X-ray (s) ordered, pending.   Physical therapy referral in place. Phone number provided to patient to call and schedule appointment.   Will be in touch with results of x-ray.   I evaluated patient, was consulted regarding treatment, and agree with assessment and plan per Tenna Delaine, RN, DNP student.   Mayra Reel, NP-C   Orders: -     DG HIP UNILAT W OR W/O PELVIS 2-3 VIEWS LEFT -     tiZANidine HCl; Take 1 tablet (4 mg total) by mouth every 8 (eight) hours as needed for muscle spasms.  Dispense: 15 tablet; Refill: 0  Encounter for immunization -     Flu Vaccine Trivalent High Dose (Fluad)  Lower extremity pain, anterior, left Assessment & Plan: Unclear  cause, could be MSK, need to consider vascular cause.  Consider vascular US of left lower extremity vs abdominal x-ray versus CT abdomen/pelvis. Await pelvic plain films first.          Doreene Nest, NP

## 2023-10-17 ENCOUNTER — Telehealth: Payer: Self-pay | Admitting: Primary Care

## 2023-10-17 NOTE — Telephone Encounter (Signed)
Spoke to pt

## 2023-10-17 NOTE — Telephone Encounter (Signed)
Patient called in to ask about xray results from xray she had done on 10/08/2023,stated that she hasn't heard anything back.

## 2023-10-17 NOTE — Telephone Encounter (Signed)
Please notify patient that the radiologist has not read the x-ray results.  We do not have the results yet.  I will be in touch as soon as we receive them.

## 2023-10-18 DIAGNOSIS — M48062 Spinal stenosis, lumbar region with neurogenic claudication: Secondary | ICD-10-CM | POA: Diagnosis not present

## 2023-10-25 DIAGNOSIS — M48062 Spinal stenosis, lumbar region with neurogenic claudication: Secondary | ICD-10-CM | POA: Diagnosis not present

## 2023-10-31 NOTE — Telephone Encounter (Signed)
Called patient and reviewed all information. Patient verbalized understanding. Will call if any further questions.  

## 2023-10-31 NOTE — Telephone Encounter (Signed)
See result note.  Let her know that we just got the x-ray back today.

## 2023-10-31 NOTE — Telephone Encounter (Signed)
Patient called in to follow up on x-ray results.

## 2023-11-27 DIAGNOSIS — M25561 Pain in right knee: Secondary | ICD-10-CM | POA: Diagnosis not present

## 2023-11-27 DIAGNOSIS — Z6828 Body mass index (BMI) 28.0-28.9, adult: Secondary | ICD-10-CM | POA: Diagnosis not present

## 2023-11-27 DIAGNOSIS — M48062 Spinal stenosis, lumbar region with neurogenic claudication: Secondary | ICD-10-CM | POA: Diagnosis not present

## 2024-01-15 DIAGNOSIS — M48062 Spinal stenosis, lumbar region with neurogenic claudication: Secondary | ICD-10-CM | POA: Diagnosis not present

## 2024-01-15 DIAGNOSIS — Z6828 Body mass index (BMI) 28.0-28.9, adult: Secondary | ICD-10-CM | POA: Diagnosis not present

## 2024-01-15 DIAGNOSIS — M25561 Pain in right knee: Secondary | ICD-10-CM | POA: Diagnosis not present

## 2024-01-23 ENCOUNTER — Other Ambulatory Visit: Payer: Self-pay | Admitting: Primary Care

## 2024-01-23 DIAGNOSIS — E785 Hyperlipidemia, unspecified: Secondary | ICD-10-CM

## 2024-01-23 NOTE — Telephone Encounter (Signed)
Patient is due for CPE/follow up in late March, this will be required prior to any further refills.  Please schedule, thank you!   

## 2024-01-23 NOTE — Telephone Encounter (Signed)
Spoke to pt, pt requested cpe to be scheduled in feb. Pt states she has new insurance & doesn't have to wait an entire year

## 2024-01-28 DIAGNOSIS — M48062 Spinal stenosis, lumbar region with neurogenic claudication: Secondary | ICD-10-CM | POA: Diagnosis not present

## 2024-02-10 DIAGNOSIS — M170 Bilateral primary osteoarthritis of knee: Secondary | ICD-10-CM | POA: Diagnosis not present

## 2024-02-10 DIAGNOSIS — M1712 Unilateral primary osteoarthritis, left knee: Secondary | ICD-10-CM | POA: Diagnosis not present

## 2024-02-10 DIAGNOSIS — M1711 Unilateral primary osteoarthritis, right knee: Secondary | ICD-10-CM | POA: Diagnosis not present

## 2024-02-12 ENCOUNTER — Ambulatory Visit: Payer: HMO | Admitting: Primary Care

## 2024-02-12 ENCOUNTER — Encounter: Payer: Self-pay | Admitting: Primary Care

## 2024-02-12 ENCOUNTER — Other Ambulatory Visit: Payer: Self-pay | Admitting: Primary Care

## 2024-02-12 VITALS — BP 138/86 | HR 76 | Temp 97.2°F | Ht 62.5 in | Wt 163.0 lb

## 2024-02-12 DIAGNOSIS — Z0001 Encounter for general adult medical examination with abnormal findings: Secondary | ICD-10-CM

## 2024-02-12 DIAGNOSIS — F411 Generalized anxiety disorder: Secondary | ICD-10-CM | POA: Diagnosis not present

## 2024-02-12 DIAGNOSIS — Z01818 Encounter for other preprocedural examination: Secondary | ICD-10-CM | POA: Insufficient documentation

## 2024-02-12 DIAGNOSIS — I4891 Unspecified atrial fibrillation: Secondary | ICD-10-CM | POA: Diagnosis not present

## 2024-02-12 DIAGNOSIS — J3089 Other allergic rhinitis: Secondary | ICD-10-CM | POA: Diagnosis not present

## 2024-02-12 DIAGNOSIS — Z1231 Encounter for screening mammogram for malignant neoplasm of breast: Secondary | ICD-10-CM

## 2024-02-12 DIAGNOSIS — M170 Bilateral primary osteoarthritis of knee: Secondary | ICD-10-CM

## 2024-02-12 DIAGNOSIS — R7303 Prediabetes: Secondary | ICD-10-CM

## 2024-02-12 DIAGNOSIS — E2839 Other primary ovarian failure: Secondary | ICD-10-CM | POA: Diagnosis not present

## 2024-02-12 DIAGNOSIS — E785 Hyperlipidemia, unspecified: Secondary | ICD-10-CM

## 2024-02-12 DIAGNOSIS — I10 Essential (primary) hypertension: Secondary | ICD-10-CM

## 2024-02-12 LAB — LIPID PANEL
Cholesterol: 170 mg/dL (ref 0–200)
HDL: 89.2 mg/dL (ref >39.00)
LDL Cholesterol: 66 mg/dL (ref 0–99)
NonHDL: 81.23
Total CHOL/HDL Ratio: 2
Triglycerides: 76 mg/dL (ref 0.0–149.0)
VLDL: 15.2 mg/dL (ref 0.0–40.0)

## 2024-02-12 LAB — COMPREHENSIVE METABOLIC PANEL
ALT: 22 U/L (ref 0–35)
AST: 26 U/L (ref 0–37)
Albumin: 4 g/dL (ref 3.5–5.2)
Alkaline Phosphatase: 76 U/L (ref 39–117)
BUN: 19 mg/dL (ref 6–23)
CO2: 29 meq/L (ref 19–32)
Calcium: 8.8 mg/dL (ref 8.4–10.5)
Chloride: 101 meq/L (ref 96–112)
Creatinine, Ser: 0.64 mg/dL (ref 0.40–1.20)
GFR: 80.91 mL/min (ref >60.00)
Glucose, Bld: 103 mg/dL — ABNORMAL HIGH (ref 70–99)
Potassium: 4.2 meq/L (ref 3.5–5.1)
Sodium: 136 meq/L (ref 135–145)
Total Bilirubin: 0.6 mg/dL (ref 0.2–1.2)
Total Protein: 6.6 g/dL (ref 6.0–8.3)

## 2024-02-12 LAB — CBC
HCT: 38.5 % (ref 36.0–46.0)
Hemoglobin: 12.7 g/dL (ref 12.0–15.0)
MCHC: 33.1 g/dL (ref 30.0–36.0)
MCV: 99.9 fL (ref 78.0–100.0)
Platelets: 240 10*3/uL (ref 150.0–400.0)
RBC: 3.85 Mil/uL — ABNORMAL LOW (ref 3.87–5.11)
RDW: 14.3 % (ref 11.5–15.5)
WBC: 9.1 10*3/uL (ref 4.0–10.5)

## 2024-02-12 LAB — HEMOGLOBIN A1C: Hgb A1c MFr Bld: 5.6 % (ref 4.6–6.5)

## 2024-02-12 MED ORDER — MONTELUKAST SODIUM 10 MG PO TABS
10.0000 mg | ORAL_TABLET | Freq: Every day | ORAL | 0 refills | Status: DC
Start: 2024-02-12 — End: 2024-05-05

## 2024-02-12 MED ORDER — APIXABAN 5 MG PO TABS
5.0000 mg | ORAL_TABLET | Freq: Two times a day (BID) | ORAL | 0 refills | Status: DC
Start: 2024-02-12 — End: 2024-04-24

## 2024-02-12 NOTE — Assessment & Plan Note (Signed)
Immunizations UTD. Mammogram UTD, repeat in May 2025. Bone density scan due in May 2025. Colonoscopy N/A given age.  Discussed the importance of a healthy diet and regular exercise in order for weight loss, and to reduce the risk of further co-morbidity.  Exam stable. Labs pending.  Follow up in 1 year for repeat physical.

## 2024-02-12 NOTE — Progress Notes (Addendum)
Subjective:    Patient ID: Jenna Hogan, female    DOB: 13-Jul-1941, 83 y.o.   MRN: [id number]  HPI  Jenna Hogan is a very pleasant 83 y.o. female who presents today for complete physical and follow up of chronic conditions.  She is also needing preoperative clearance.  She is pending bilateral knee replacements at different times and per Dr. Jerl Santos.  She continues to struggle with chronic allergies. Symptoms include rhinorrhea, tickle to the throat with a cough, sneezing. She has failed Zyrtec, Xyzal, Claritin, and Allegra with temporary improvement.   Immunizations: -Tetanus: Completed in 2018 -Influenza: Completed this season   -Shingles: Completed Shingrix series -Pneumonia: Completed Prevnar 20 in 2024, Pneumovax in 2009  Diet: Fair diet.  Exercise: No regular exercise.  Eye exam: Completes annually  Dental exam: Completes semi-annually    Mammogram: Completed in May 2024 Bone Density Scan: Completed in May 2023  Colonoscopy: N/A given age..  BP Readings from Last 3 Encounters:  02/12/24 138/86  10/08/23 124/82  09/20/23 (!) 160/72         Review of Systems  Constitutional:  Negative for unexpected weight change.  HENT:  Positive for rhinorrhea and sneezing.   Respiratory:  Negative for cough and shortness of breath.   Cardiovascular:  Negative for chest pain.  Gastrointestinal:  Negative for constipation and diarrhea.  Genitourinary:  Negative for difficulty urinating.  Musculoskeletal:  Positive for arthralgias and back pain.  Skin:  Negative for rash.  Allergic/Immunologic: Positive for environmental allergies.  Neurological:  Negative for dizziness, numbness and headaches.  Psychiatric/Behavioral:  The patient is not nervous/anxious.          Past Medical History:  Diagnosis Date   Depression    Essential hypertension    Fatigue    Generalized abdominal pain 03/09/2022   Hypercholesteremia    Urinary incontinence     Social  History   Socioeconomic History   Marital status: Widowed    Spouse name: Not on file   Number of children: Not on file   Years of education: Not on file   Highest education level: Not on file  Occupational History   Not on file  Tobacco Use   Smoking status: Never   Smokeless tobacco: Never  Substance and Sexual Activity   Alcohol use: Yes    Comment: social   Drug use: No   Sexual activity: Not on file  Other Topics Concern   Not on file  Social History Narrative   Married.   Retired. Once worked in Community education officer.   Enjoys crocheting, spending time with family, traveling.    Social Drivers of Corporate investment banker Strain: Low Risk  (03/07/2023)   Overall Financial Resource Strain (CARDIA)    Difficulty of Paying Living Expenses: Not hard at all  Food Insecurity: No Food Insecurity (03/07/2023)   Hunger Vital Sign    Worried About Running Out of Food in the Last Year: Never true    Ran Out of Food in the Last Year: Never true  Transportation Needs: No Transportation Needs (03/07/2023)   PRAPARE - Administrator, Civil Service (Medical): No    Lack of Transportation (Non-Medical): No  Physical Activity: Inactive (03/07/2023)   Exercise Vital Sign    Days of Exercise per Week: 0 days    Minutes of Exercise per Session: 0 min  Stress: No Stress Concern Present (03/07/2023)   [employer] of Occupational Health - Occupational Stress Questionnaire  Feeling of Stress : Not at all  Social Connections: Moderately Integrated (03/07/2023)   Social Connection and Isolation Panel [NHANES]    Frequency of Communication with Friends and Family: More than three times a week    Frequency of Social Gatherings with Friends and Family: More than three times a week    Attends Religious Services: More than 4 times per year    Active Member of [employer] or Organizations: Yes    Attends Banker Meetings: More than 4 times per year    Marital Status: Widowed  Intimate  Partner Violence: Not At Risk (03/07/2023)   Humiliation, Afraid, Rape, and Kick questionnaire    Fear of Current or Ex-Partner: No    Emotionally Abused: No    Physically Abused: No    Sexually Abused: No    Past Surgical History:  Procedure Laterality Date   ABDOMINAL HYSTERECTOMY     CARPAL TUNNEL RELEASE  1998, 2002   x2   HERNIA REPAIR Bilateral    MENISCUS REPAIR Left 2011    Family History  Problem Relation Age of Onset   Heart disease Mother    Arthritis Mother    Breast cancer Neg Hx     No Known Allergies  Current Outpatient Medications on File Prior to Visit  Medication Sig Dispense Refill   calcium carbonate (OS-CAL - DOSED IN MG OF ELEMENTAL CALCIUM) 1250 (500 Ca) MG tablet Take 2 tablets by mouth.     Cholecalciferol (D3-1000 PO)      metoprolol tartrate (LOPRESSOR) 50 MG tablet TAKE 1 TABLET (50 MG TOTAL) BY MOUTH 2 (TWO) TIMES DAILY. FOR BLOOD PRESSURE. 180 tablet 2   Multiple Vitamins-Minerals (MULTIVITAMIN ADULT EXTRA C PO)      Multiple Vitamins-Minerals (PRESERVISION AREDS 2 PO) Take 2 capsules by mouth daily. Take one in the morning and one at night.     Probiotic Product (PROBIOTIC PO) Take 1 Capful by mouth daily.     sertraline (ZOLOFT) 50 MG tablet TAKE 1 TABLET BY MOUTH EVERY DAY FOR ANXIETY 90 tablet 3   simvastatin (ZOCOR) 40 MG tablet TAKE 1 TABLET BY MOUTH EVERY EVENING FOR CHOLESTEROL 90 tablet 0   No current facility-administered medications on file prior to visit.    BP 138/86   Pulse 76   Temp (!) 97.2 F (36.2 C) (Temporal)   Ht 5' 2.5" (1.588 m)   Wt 163 lb (73.9 kg)   SpO2 97%   BMI 29.34 kg/m  Objective:   Physical Exam HENT:     Right Ear: Tympanic membrane and ear canal normal.     Left Ear: Tympanic membrane and ear canal normal.  Eyes:     Pupils: Pupils are equal, round, and reactive to light.  Cardiovascular:     Rate and Rhythm: Normal rate. Rhythm irregular.  Pulmonary:     Effort: Pulmonary effort is normal.      Breath sounds: Normal breath sounds.  Abdominal:     General: Bowel sounds are normal.     Palpations: Abdomen is soft.     Tenderness: There is no abdominal tenderness.  Musculoskeletal:     Cervical back: Neck supple.     Lumbar back: Decreased range of motion.     Right knee: Decreased range of motion.     Left knee: Decreased range of motion.  Skin:    General: Skin is warm and dry.  Neurological:     Mental Status: She is alert and oriented  to person, place, and time.     Cranial Nerves: No cranial nerve deficit.     Deep Tendon Reflexes:     Reflex Scores:      Patellar reflexes are 2+ on the right side and 2+ on the left side. Psychiatric:        Mood and Affect: Mood normal.           Assessment & Plan:  Encounter for annual general medical examination with abnormal findings in adult Assessment & Plan: Immunizations UTD. Mammogram UTD, repeat in May 2025. Bone density scan due in May 2025. Colonoscopy N/A given age.  Discussed the importance of a healthy diet and regular exercise in order for weight loss, and to reduce the risk of further co-morbidity.  Exam stable. Labs pending.  Follow up in 1 year for repeat physical.    Primary osteoarthritis of both knees Assessment & Plan: Following with orthopedics.  Will require surgery.   Will obtain labs and ECG today. Should be able to clear for surgery pending results.   Orders: -     EKG 12-Lead  GAD (generalized anxiety disorder) Assessment & Plan: Stable.  Continue Zoloft 50 mg daily.    Hyperlipidemia, unspecified hyperlipidemia type Assessment & Plan: Repeat lipid panel pending.  Continue simvastatin 40 mg daily.   Orders: -     Comprehensive metabolic panel -     Lipid panel  Environmental and seasonal allergies Assessment & Plan: Chronic and continued.   Start montelukast 10 mg at bedtime.  New prescription sent to pharmacy. She will update.  Orders: -     Montelukast Sodium;  Take 1 tablet (10 mg total) by mouth at bedtime. For allergies  Dispense: 90 tablet; Refill: 0  Prediabetes Assessment & Plan: Repeat A1c pending.  Orders: -     Hemoglobin A1c -     CBC  Preoperative clearance Assessment & Plan: Pending bilateral knee replacements per Dr. Jerl Santos  Labs pending today.  ECG today with atrial fibrillation, rate of 84. New finding.  Compared with ECG from 2016 which does not reveal atrial fibrillation.  Irregularities auscultated upon exam.  Will consult with orthopedic surgeon regarding new onset of atrial fibrillation for which will require anticoagulation.  Orders: -     EKG 12-Lead -     Hemoglobin A1c -     Comprehensive metabolic panel -     Lipid panel -     CBC  Screening mammogram for breast cancer -     3D Screening Mammogram, Left and Right; Future  Estrogen deficiency -     DG Bone Density; Future  Atrial fibrillation, unspecified type [facility]) Assessment & Plan: New diagnosis as of today. EKG and examination confirmed.  CHA2DS2-VASc score of 4 today for age, gender, hypertension history.  Checking renal function today. Will initiate apixaban 5 mg twice daily. She is rate controlled.  Continue metoprolol to tartrate 50 mg twice daily.     Essential hypertension Assessment & Plan: Controlled.  Continue metoprolol tartrate 50 mg twice daily.         Doreene Nest, NP

## 2024-02-12 NOTE — Patient Instructions (Addendum)
Stop by the lab prior to leaving today. I will notify you of your results once received.   [facility] your mammogram and bone density scan for May or June 2025.  Start montelukast (Singulair) 10 mg every evening at bedtime for allergies.  It was a pleasure to see you today! Atrial Fibrillation Atrial fibrillation (AFib) is a type of irregular or rapid heartbeat (arrhythmia). In AFib, the top part of the heart (atria) beats in an irregular pattern. This makes the heart unable to pump blood normally and effectively. The goal of treatment is to prevent blood clots from forming, control your heart rate, or restore your heartbeat to a normal rhythm. If this condition is not treated, it can cause serious problems, such as a weakened heart muscle (cardiomyopathy) or a stroke. What are the causes? This condition is often caused by medical conditions that damage the heart's electrical system. These include: High blood pressure (hypertension). This is the most common cause. Certain heart problems or conditions, such as heart failure, coronary artery disease, heart valve problems, or heart surgery. Diabetes. Overactive thyroid (hyperthyroidism). Chronic kidney disease. Certain lung conditions, such as emphysema, pneumonia, or COPD. Obstructive sleep apnea. In some cases, the cause of this condition is not known. What increases the risk? This condition is more likely to develop in: Older adults. Athletes who do endurance exercise. People who have a family history of AFib. Males. People who are Caucasian. People who are obese. People who smoke or misuse alcohol. What are the signs or symptoms? Symptoms of this condition include: Fast or irregular heartbeats (palpitations). Discomfort or pain in your chest. Shortness of breath. Sudden light-headedness or weakness. Tiring easily during exercise or activity. Syncope (fainting). Sweating. In some cases, there are no  symptoms. How is this diagnosed? Your health care provider may detect AFib when taking your pulse. If detected, this condition may be diagnosed with: An electrocardiogram (ECG) to check electrical signals of the heart. An ambulatory cardiac monitor to record your heart's activity for a few days. A transthoracic echocardiogram (TTE) to create pictures of your heart. A transesophageal echocardiogram (TEE) to create even clearer pictures of your heart. A stress test to check your blood supply while you exercise. Imaging tests, such as a CT scan or chest X-ray. Blood tests. How is this treated? Treatment depends on underlying conditions and how you feel when you get AFib. This condition may be treated with: Medicines to prevent blood clots or to treat heart rate or heart rhythm problems. Electrical cardioversion to reset the heart's rhythm. A pacemaker to correct abnormal heart rhythm. Ablation to remove the heart tissue that sends abnormal signals. Left atrial appendage closure to seal the area where blood clots can form. In some cases, underlying conditions will be treated. Follow these instructions at home: Medicines Take over-the counter and prescription medicines only as told by your provider. Do not take any new medicines without talking to your provider. If you are taking blood thinners: Talk with your provider before taking aspirin or NSAIDs. These medicines can raise your risk of bleeding. Take your medicines as told. Take them at the same time each day. Do not do things that could hurt or bruise you. Be careful to avoid falls. Wear an alert bracelet or carry a card that says that you take blood thinners. Lifestyle Do not use any products that contain nicotine or tobacco. These products include cigarettes, chewing tobacco, and vaping devices, such as e-cigarettes. If you need  help quitting, ask your provider. Eat heart-healthy foods. Talk with a food expert (dietitian) to make an  eating plan that is right for you. Exercise regularly as told by your provider. Do not drink alcohol. Lose weight if you are overweight. General instructions If you have obstructive sleep apnea, manage your condition as told by your provider. Do not use diet pills unless your provider approves. Diet pills can make heart problems worse. Keep all follow-up visits. Your provider will want to check your heart rate and rhythm regularly. Contact a health care provider if: You notice a change in the rate, rhythm, or strength of your heartbeat. You are taking a blood thinner and you notice more bruising. You tire more easily when you exercise or do heavy work. You have a sudden change in weight. Get help right away if:  You have chest pain. You have trouble breathing. You have side effects of blood thinners, such as blood in your vomit, poop (stool), or pee (urine), or bleeding that does not stop. You have any symptoms of a stroke. "BE FAST" is an easy way to remember the main warning signs of a stroke: B - Balance. Signs are dizziness, sudden trouble walking, or loss of balance. E - Eyes. Signs are trouble seeing or a sudden change in vision. F - Face. Signs are sudden weakness or numbness of the face, or the face or eyelid drooping on one side. A - Arms. Signs are weakness or numbness in an arm. This happens suddenly and usually on one side of the body. S - Speech.Signs are sudden trouble speaking, slurred speech, or trouble understanding what people say. T - Time. Time to call emergency services. Write down what time symptoms started. Other signs of a stroke, such as: A sudden, severe headache with no known cause. Nausea or vomiting. Seizure. These symptoms may be an emergency. Get help right away. Call 911. Do not wait to see if the symptoms will go away. Do not drive yourself to the hospital. This information is not intended to replace advice given to you by your health care provider.  Make sure you discuss any questions you have with your health care provider. Document Revised: 09/05/2022 Document Reviewed: 09/05/2022 Elsevier Patient Education  2024 [employer].

## 2024-02-12 NOTE — Assessment & Plan Note (Addendum)
Pending bilateral knee replacements per Dr. Jerl Santos  Labs pending today.  ECG today with atrial fibrillation, rate of 84. New finding.  Compared with ECG from 2016 which does not reveal atrial fibrillation.  Irregularities auscultated upon exam.  Will consult with orthopedic surgeon regarding new onset of atrial fibrillation for which will require anticoagulation.

## 2024-02-12 NOTE — Assessment & Plan Note (Signed)
Controlled. ? ?Continue metoprolol tartrate 50 mg twice daily. ?

## 2024-02-12 NOTE — Assessment & Plan Note (Signed)
Chronic and continued.   Start montelukast 10 mg at bedtime.  New prescription sent to pharmacy. She will update.

## 2024-02-12 NOTE — Assessment & Plan Note (Addendum)
New diagnosis as of today. EKG and examination confirmed.  CHA2DS2-VASc score of 4 today for age, gender, hypertension history.  Checking renal function today. Will initiate apixaban 5 mg twice daily. She is rate controlled.  Continue metoprolol to tartrate 50 mg twice daily.

## 2024-02-12 NOTE — Assessment & Plan Note (Signed)
Repeat lipid panel pending.  Continue simvastatin 40 mg daily.

## 2024-02-12 NOTE — Assessment & Plan Note (Signed)
Following with orthopedics.  Will require surgery.   Will obtain labs and ECG today. Should be able to clear for surgery pending results.

## 2024-02-12 NOTE — Assessment & Plan Note (Signed)
Repeat A1c pending

## 2024-02-12 NOTE — Assessment & Plan Note (Signed)
Stable.  Continue Zoloft 50 mg daily.

## 2024-02-13 ENCOUNTER — Ambulatory Visit: Payer: HMO

## 2024-02-13 DIAGNOSIS — I4891 Unspecified atrial fibrillation: Secondary | ICD-10-CM

## 2024-02-13 LAB — TSH: TSH: 1.01 u[IU]/mL (ref 0.35–5.50)

## 2024-02-13 NOTE — Addendum Note (Signed)
Addended by: Vincenza Hews on: 02/13/2024 10:49 AM   Modules accepted: Orders

## 2024-02-14 ENCOUNTER — Telehealth: Payer: Self-pay | Admitting: Primary Care

## 2024-02-14 ENCOUNTER — Other Ambulatory Visit: Payer: Self-pay | Admitting: Primary Care

## 2024-02-14 DIAGNOSIS — I4891 Unspecified atrial fibrillation: Secondary | ICD-10-CM

## 2024-02-14 NOTE — Telephone Encounter (Signed)
Per result note message was left at orthopedic office yesterday for surgeon.   Called and spoke with patient, she stated the orthopedic office called her today after the doctor reviewed message left from our office yesterday and told her they can not do the surgery or anything until she is cleared by PCP or cardiology. Patient is wanting to know does she need to see cardiology in order to be cleared quicker? Patient states she has not been contacted to have imaging or echocardiogram done, did not see order for this?  Patient picked up Eliquis and has begun taking it, she had concerns regarding pain medication for her knee while on that. She was taking four ibprofuen a day and she stopped that due instructions saying not to take NSAIDs with Eliquis and she switched to tylenol extra strength. This did not help her pain at all, she wants to know if there is anything else she can try taking for her knee pain or does she need to stick it out with the tylenol? Patient stated she is in a lot of pain from her knees and is needing to be cleared quickly.

## 2024-02-14 NOTE — Telephone Encounter (Signed)
Correct, she should not be taking NSAID medication with the Eliquis.  She can continue Tylenol and reach out to her surgeon regarding medication for her pain.  I recommend that she proceed with the echocardiogram, orders were placed today.  Once we have those results then we can reevaluate clearance for surgery.  Also, I have not received a surgical clearance form from her orthopedic surgeon.

## 2024-02-14 NOTE — Telephone Encounter (Signed)
Copied from CRM [id number]. Topic: General - Other >> Feb 14, 2024 12:48 PM Eunice Blase wrote: Reason for CRM: Pt called stated needs clearance for knee surgery. Declined appt. Would like a call back at [phone].

## 2024-02-14 NOTE — Telephone Encounter (Signed)
We need to contact her surgeons office and let them know about her new onset of atrial fibrillation.  They can decide on when to do surgery.  Also, did she pick up the apixaban (Eliquis) medication from the pharmacy?  She is to take this twice daily for stroke prevention because of her new A-fib.

## 2024-02-17 NOTE — Telephone Encounter (Signed)
 Called patient and reviewed all information. Patient verbalized understanding. Will call if any further questions.

## 2024-02-20 ENCOUNTER — Other Ambulatory Visit: Payer: Self-pay | Admitting: Primary Care

## 2024-02-20 DIAGNOSIS — I10 Essential (primary) hypertension: Secondary | ICD-10-CM

## 2024-03-11 ENCOUNTER — Ambulatory Visit: Payer: Medicare HMO

## 2024-03-11 VITALS — Ht 62.5 in | Wt 156.0 lb

## 2024-03-11 DIAGNOSIS — Z0000 Encounter for general adult medical examination without abnormal findings: Secondary | ICD-10-CM | POA: Diagnosis not present

## 2024-03-11 NOTE — Patient Instructions (Addendum)
 Ms. Jenna Hogan , Thank you for taking time to come for your Medicare Wellness Visit. I appreciate your ongoing commitment to your health goals. Please review the following plan we discussed and let me know if I can assist you in the future.   Referrals/Orders/Follow-Ups/Clinician Recommendations: none  This is a list of the screening recommended for you and due dates:  Health Maintenance  Topic Date Due   COVID-19 Vaccine (4 - 2024-25 season) 09/01/2023   Medicare Annual Wellness Visit  03/11/2025   DTaP/Tdap/Td vaccine (2 - Td or Tdap) 04/01/2027   Pneumonia Vaccine  Completed   Flu Shot  Completed   DEXA scan (bone density measurement)  Completed   Zoster (Shingles) Vaccine  Completed   HPV Vaccine  Aged Out    Advanced directives: (Copy Requested) Please bring a copy of your health care power of attorney and living will to the office to be added to your chart at your convenience. You can mail to [facility] [address] [place], [place] [address] or email to [email or web address]  Next Medicare Annual Wellness Visit scheduled for next year: Yes 03/15/2025 @ 11:30am televisit

## 2024-03-11 NOTE — Progress Notes (Addendum)
 Please attest and cosign this visit due to patients primary care provider not being in the office at the time the visit was completed.    Subjective:   Isabella Roemmich is a 83 y.o. who presents for a Medicare Wellness preventive visit.  Visit Complete: Virtual I connected with  Hephzibah Strehle on 03/11/24 by a audio enabled telemedicine application and verified that I am speaking with the correct person using two identifiers.  Patient Location: Home  Provider Location: Office/Clinic  I discussed the limitations of evaluation and management by telemedicine. The patient expressed understanding and agreed to proceed.  Vital Signs: Because this visit was a virtual/telehealth visit, some criteria may be missing or patient reported. Any vitals not documented were not able to be obtained and vitals that have been documented are patient reported.  VideoDeclined- This patient declined Librarian, academic. Therefore the visit was completed with audio only.  AWV Questionnaire: No: Patient Medicare AWV questionnaire was not completed prior to this visit.  Cardiac Risk Factors include: advanced age (>23men, >32 women);dyslipidemia;hypertension;sedentary lifestyle     Objective:    Today's Vitals   03/11/24 1135 03/11/24 1137  Weight: 156 lb (70.8 kg)   Height: 5' 2.5" (1.588 m)   PainSc:  8    Body mass index is 28.08 kg/m.     03/11/2024   11:49 AM 03/07/2023   10:03 AM 03/05/2022    9:51 AM 03/03/2021    9:48 AM 05/07/2015    7:29 PM 05/06/2015    4:49 PM  Advanced Directives  Does Patient Have a Medical Advance Directive? Yes No Yes Yes No No  Type of Estate agent of [place];Living will  Healthcare Power of [place];Living will Healthcare Power of [place];Living will    Does patient want to make changes to medical advance directive?   Yes (MAU/Ambulatory/Procedural Areas - Information given)     Copy of Healthcare Power of Attorney in  Chart? No - copy requested   No - copy requested    Would patient like information on creating a medical advance directive?  No - Patient declined   No - patient declined information No - patient declined information    Current Medications (verified) Outpatient Encounter Medications as of 03/11/2024  Medication Sig   apixaban (ELIQUIS) 5 MG TABS tablet Take 1 tablet (5 mg total) by mouth 2 (two) times daily. For stroke prevention   calcium carbonate (OS-CAL - DOSED IN MG OF ELEMENTAL CALCIUM) 1250 (500 Ca) MG tablet Take 2 tablets by mouth.   Cholecalciferol (D3-1000 PO)    metoprolol tartrate (LOPRESSOR) 50 MG tablet TAKE 1 TABLET (50 MG TOTAL) BY MOUTH 2 (TWO) TIMES DAILY. FOR BLOOD PRESSURE.   Multiple Vitamins-Minerals (MULTIVITAMIN ADULT EXTRA C PO)    Multiple Vitamins-Minerals (PRESERVISION AREDS 2 PO) Take 2 capsules by mouth daily. Take one in the morning and one at night.   Probiotic Product (PROBIOTIC PO) Take 1 Capful by mouth daily.   sertraline (ZOLOFT) 50 MG tablet TAKE 1 TABLET BY MOUTH EVERY DAY FOR ANXIETY   simvastatin (ZOCOR) 40 MG tablet TAKE 1 TABLET BY MOUTH EVERY EVENING FOR CHOLESTEROL   montelukast (SINGULAIR) 10 MG tablet Take 1 tablet (10 mg total) by mouth at bedtime. For allergies (Patient not taking: Reported on 03/11/2024)   No facility-administered encounter medications on file as of 03/11/2024.    Allergies (verified) Patient has no known allergies.   History: Past Medical History:  Diagnosis Date  Depression    Essential hypertension    Fatigue    Generalized abdominal pain 03/09/2022   Hypercholesteremia    Urinary incontinence    Past Surgical History:  Procedure Laterality Date   ABDOMINAL HYSTERECTOMY     CARPAL TUNNEL RELEASE  1998, 2002   x2   HERNIA REPAIR Bilateral    MENISCUS REPAIR Left 2011   Family History  Problem Relation Age of Onset   Heart disease Mother    Arthritis Mother    Breast cancer Neg Hx    Social History    Socioeconomic History   Marital status: Widowed    Spouse name: Not on file   Number of children: Not on file   Years of education: Not on file   Highest education level: Not on file  Occupational History   Not on file  Tobacco Use   Smoking status: Never   Smokeless tobacco: Never  Substance and Sexual Activity   Alcohol use: Yes    Comment: social   Drug use: No   Sexual activity: Not on file  Other Topics Concern   Not on file  Social History Narrative   Married.   Retired. Once worked in Community education officer.   Enjoys crocheting, spending time with family, traveling.    Social Drivers of Corporate investment banker Strain: Low Risk  (03/11/2024)   Overall Financial Resource Strain (CARDIA)    Difficulty of Paying Living Expenses: Not hard at all  Food Insecurity: No Food Insecurity (03/11/2024)   Hunger Vital Sign    Worried About Running Out of Food in the Last Year: Never true    Ran Out of Food in the Last Year: Never true  Transportation Needs: No Transportation Needs (03/11/2024)   PRAPARE - Administrator, Civil Service (Medical): No    Lack of Transportation (Non-Medical): No  Physical Activity: Insufficiently Active (03/11/2024)   Exercise Vital Sign    Days of Exercise per Week: 4 days    Minutes of Exercise per Session: 20 min  Stress: No Stress Concern Present (03/11/2024)   [employer] of Occupational Health - Occupational Stress Questionnaire    Feeling of Stress : Not at all  Social Connections: Moderately Integrated (03/11/2024)   Social Connection and Isolation Panel [NHANES]    Frequency of Communication with Friends and Family: More than three times a week    Frequency of Social Gatherings with Friends and Family: More than three times a week    Attends Religious Services: More than 4 times per year    Active Member of [employer] or Organizations: Yes    Attends Banker Meetings: More than 4 times per year    Marital Status: Widowed     Tobacco Counseling Counseling given: Not Answered    Clinical Intake:  Pre-visit preparation completed: Yes  Pain : 0-10 Pain Score: 8  Pain Type: Chronic pain Pain Location: Knee (both knees) Pain Descriptors / Indicators: Aching Pain Onset: More than a month ago Pain Frequency: Constant Pain Relieving Factors: Tylenol  Pain Relieving Factors: Tylenol  BMI - recorded: 28.08 Nutritional Status: BMI 25 -29 Overweight Nutritional Risks: None Diabetes: No  How often do you need to have someone help you when you read instructions, pamphlets, or other written materials from your doctor or pharmacy?: 1 - Never  Interpreter Needed?: No  Comments: live alone Information entered by :: B.Hasan Douse,LPN   Activities of Daily Living     03/11/2024  11:50 AM  In your present state of health, do you have any difficulty performing the following activities:  Hearing? 0  Vision? 0  Difficulty concentrating or making decisions? 0  Walking or climbing stairs? 1  Dressing or bathing? 0  Doing errands, shopping? 0  Preparing Food and eating ? N  Using the Toilet? N  In the past six months, have you accidently leaked urine? Y  Do you have problems with loss of bowel control? N  Managing your Medications? N  Managing your Finances? N  Housekeeping or managing your Housekeeping? Y    Patient Care Team: Doreene Nest, NP as PCP - General (Internal Medicine)  Indicate any recent Medical Services you may have received from other than [facility] providers in the past year (date may be approximate).     Assessment:   This is a routine wellness examination for [place].  Hearing/Vision screen Hearing Screening - Comments:: Pt says her hearing is good Vision Screening - Comments:: Pt says her vision is good [facility]   Goals Addressed             This Visit's Progress    Patient Stated   On track    03/11/2024, I will maintain and continue medications as prescribed.       Patient Stated   Not on track    03/11/24-Would like to get back to continue current routine, more involved with church since husband passed       Depression Screen     03/11/2024   11:46 AM 02/12/2024   11:11 AM 10/08/2023    2:15 PM 03/28/2023   10:40 AM 03/07/2023    9:59 AM 01/14/2023    3:11 PM 06/15/2022   11:09 AM  PHQ 2/9 Scores  PHQ - 2 Score 0 0 0 0 0 0 0  PHQ- 9 Score   2 4 0 2     Fall Risk     03/11/2024   11:41 AM 02/12/2024   11:10 AM 10/08/2023    2:15 PM 03/28/2023   10:40 AM 03/07/2023   10:05 AM  Fall Risk   Falls in the past year? 1 0 1 1 0  Number falls in past yr: 1 0 1 0 0  Injury with Fall? 0 0 0 0 0  Risk for fall due to : No Fall Risks No Fall Risks History of fall(s) Impaired balance/gait No Fall Risks  Follow up Education provided;Falls prevention discussed Falls evaluation completed Falls evaluation completed Falls evaluation completed Falls prevention discussed;Falls evaluation completed    MEDICARE RISK AT HOME:  Medicare Risk at Home Any stairs in or around the home?: Yes If so, are there any without handrails?: Yes Home free of loose throw rugs in walkways, pet beds, electrical cords, etc?: Yes Adequate lighting in your home to reduce risk of falls?: Yes Life alert?: Yes Use of a cane, walker or w/c?: Yes (cane) Grab bars in the bathroom?: Yes Shower chair or bench in shower?: No Elevated toilet seat or a handicapped toilet?: No  TIMED UP AND GO:  Was the test performed?  No  Cognitive Function: 6CIT completed    03/03/2021    9:55 AM  MMSE - Mini Mental State Exam  Orientation to time 5  Orientation to Place 5  Registration 3  Attention/ Calculation 5  Recall 3  Language- repeat 1        03/11/2024   11:52 AM 03/07/2023   10:06 AM  6CIT Screen  What Year? 0 points 0 points  What month? 0 points 0 points  What time? 0 points 0 points  Count back from 20 0 points 0 points  Months in reverse 0 points 0 points  Repeat phrase 4  points 2 points  Total Score 4 points 2 points    Immunizations Immunization History  Administered Date(s) Administered   Fluad Quad(high Dose 65+) 09/09/2019, 10/05/2022   Fluad Trivalent(High Dose 65+) 10/08/2023   Influenza Split 10/07/2007, 09/21/2008, 11/01/2009   Influenza, High Dose Seasonal PF 10/12/2021   Influenza,inj,quad, With Preservative 10/10/2010   Influenza-Unspecified 09/30/2020   PFIZER(Purple Top)SARS-COV-2 Vaccination 01/22/2020, 02/14/2020, 10/17/2020   PNEUMOCOCCAL CONJUGATE-20 03/28/2023   Pneumococcal Polysaccharide-23 09/21/2008   Tdap 03/31/2017   Zoster Recombinant(Shingrix) 08/30/2023, 01/08/2024    Screening Tests Health Maintenance  Topic Date Due   COVID-19 Vaccine (4 - 2024-25 season) 09/01/2023   Medicare Annual Wellness (AWV)  03/11/2025   DTaP/Tdap/Td (2 - Td or Tdap) 04/01/2027   Pneumonia Vaccine 40+ Years old  Completed   INFLUENZA VACCINE  Completed   DEXA SCAN  Completed   Zoster Vaccines- Shingrix  Completed   HPV VACCINES  Aged Out    Health Maintenance  Health Maintenance Due  Topic Date Due   COVID-19 Vaccine (4 - 2024-25 season) 09/01/2023   Health Maintenance Items Addressed: none needed   Additional Screening:  Vision Screening: Recommended annual ophthalmology exams for early detection of glaucoma and other disorders of the eye.  Dental Screening: Recommended annual dental exams for proper oral hygiene  Community Resource Referral / Chronic Care Management: CRR required this visit?  No   CCM required this visit?  No     Plan:     I have personally reviewed and noted the following in the patient's chart:   Medical and social history Use of alcohol, tobacco or illicit drugs  Current medications and supplements including opioid prescriptions. Patient is not currently taking opioid prescriptions. Functional ability and status Nutritional status Physical activity Advanced directives List of other  physicians Hospitalizations, surgeries, and ER visits in previous 12 months Vitals Screenings to include cognitive, depression, and falls Referrals and appointments  In addition, I have reviewed and discussed with patient certain preventive protocols, quality metrics, and best practice recommendations. A written personalized care plan for preventive services as well as general preventive health recommendations were provided to patient.    Sue Lush, LPN   01/30/8656   After Visit Summary: (MyChart) Due to this being a telephonic visit, the after visit summary with patients personalized plan was offered to patient via MyChart   Notes:  Pt says  she wants the results of labs asap so she can be cleared for knee surgery (as experiencing so much pain and is limiting her care for herself)

## 2024-03-12 ENCOUNTER — Ambulatory Visit (HOSPITAL_COMMUNITY): Payer: HMO | Attending: Internal Medicine

## 2024-03-12 DIAGNOSIS — I4891 Unspecified atrial fibrillation: Secondary | ICD-10-CM | POA: Insufficient documentation

## 2024-03-12 LAB — ECHOCARDIOGRAM COMPLETE
Est EF: 55
S' Lateral: 3.1 cm

## 2024-03-14 ENCOUNTER — Other Ambulatory Visit: Payer: Self-pay | Admitting: Primary Care

## 2024-03-14 DIAGNOSIS — I071 Rheumatic tricuspid insufficiency: Secondary | ICD-10-CM

## 2024-03-14 DIAGNOSIS — I4891 Unspecified atrial fibrillation: Secondary | ICD-10-CM

## 2024-04-03 ENCOUNTER — Other Ambulatory Visit: Payer: Self-pay | Admitting: Primary Care

## 2024-04-03 DIAGNOSIS — F411 Generalized anxiety disorder: Secondary | ICD-10-CM

## 2024-04-15 NOTE — Progress Notes (Unsigned)
 Cardiology Office Note:  .   Date:  04/16/2024  ID:  Jenna Hogan, DOB 12-10-1941, MRN [id number] PCP: Doreene Nest, NP  [facility] Providers Cardiologist:  Reatha Harps, MD {  History of Present Illness: .    Chief Complaint  Patient presents with   Atrial Fibrillation    Jenna Hogan is a 83 y.o. female with history of Afib, HLD who presents for the evaluation of Afib at the request of Doreene Nest, NP.  History of Present Illness   Jenna Hogan "Jenna Hogan" is an 83 year old female with atrial fibrillation who presents for evaluation of atrial fibrillation. She was referred by her general practitioner, Sammuel Cooper, for evaluation of atrial fibrillation.  Her atrial fibrillation was discovered incidentally. She experiences irregular heartbeats, especially during activities like cleaning or walking outdoors. She also has shortness of breath during these activities, which has been present for several months. Her daughter-in-law first noticed this symptom in December. She attributes some symptoms to aging.  She reports occasional chest pain, described as a 'pin-like' sensation, which started a couple of weeks ago and occurs intermittently. No swelling in her legs.  She is currently taking metoprolol for high blood pressure, which she has been on since approximately 2017. She was recently started on Eliquis for her atrial fibrillation and reports no issues with bleeding.  She has a history of knee problems, with bone-on-bone arthritis in both knees, and is planning to have knee surgery. She reports significant pain in her knees, which limits her activity and contributes to her shortness of breath. She used to be very active, taking care of her yard and garden, but now finds it difficult to walk even short distances without needing to rest.  Her past medical history includes carpal tunnel surgery, a hysterectomy, and meniscus surgery. No history  of heart attack or stroke. Her family history is notable for her mother having had congestive heart failure, although she lived to be in her 58s.  She is a widow, has three children, seven grandchildren, and four great-grandchildren. She does not smoke and consumes alcohol occasionally. 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       TSH 1.01    Problem List Persistent atrial fibrillation  -Dx 02/12/2024 2. Moderate to severe TR 3. Pulmonary hypertension  4. HLD -T chol 170, TG 76, HDL 89, LDL 66     ROS: All other ROS reviewed and negative. Pertinent positives noted in the HPI.     Studies Reviewed: Marland Kitchen   EKG Interpretation Date/Time:  Thursday April 16 2024 14:46:47 EDT Ventricular Rate:  92 PR Interval:    QRS Duration:  92 QT Interval:  342 QTC Calculation: 422 R Axis:   19  Text Interpretation: Atrial fibrillation with premature ventricular or aberrantly conducted complexes Confirmed by Lennie Odor [phone]) on 04/16/2024 3:02:16 PM   TTE 03/12/2024  1. Left ventricular ejection fraction, by estimation, is 55%. The left  ventricle has severely decreased function. The left ventricle has no  regional wall motion abnormalities. Left ventricular diastolic function  could not be evaluated.   2. Right ventricular systolic function is normal. The right ventricular  size is normal. There is moderately elevated pulmonary artery systolic  pressure. The estimated right ventricular systolic pressure is 45.9 mmHg.   3. Left atrial size was moderately dilated.   4. Right atrial size was moderately dilated.   5. The mitral valve is normal in structure. Mild mitral  valve  regurgitation. No evidence of mitral stenosis.   6. Tricuspid valve regurgitation is severe.   7. The aortic valve is tricuspid. There is mild calcification of the  aortic valve. Aortic valve regurgitation is trivial. No aortic stenosis is  present.   8. The inferior vena cava is dilated in size  with <50% respiratory  variability, suggesting right atrial pressure of 15 mmHg.  Physical Exam:   VS:  BP (!) 142/98   Pulse 92   Ht 5\' 2"  (1.575 m)   Wt 153 lb 12.8 oz (69.8 kg)   SpO2 99%   BMI 28.13 kg/m    Wt Readings from Last 3 Encounters:  04/16/24 153 lb 12.8 oz (69.8 kg)  03/11/24 156 lb (70.8 kg)  02/12/24 163 lb (73.9 kg)    GEN: Well nourished, well developed in no acute distress NECK: No JVD; No carotid bruits CARDIAC: irregular rhythm, no murmurs, rubs, gallops RESPIRATORY:  Clear to auscultation without rales, wheezing or rhonchi  ABDOMEN: Soft, non-tender, non-distended EXTREMITIES:  No edema; No deformity  ASSESSMENT AND PLAN: .   Assessment and Plan    Atrial Fibrillation. persistent  New onset, likely long-standing based on echo. Heart rate controlled with metoprolol. Normal LV/RV function, mildly elevated pulmonary pressures. On Eliquis for stroke risk. Shortness of breath and chest pain etiology unclear. Cardioversion would delay knee surgery. - Continue metoprolol for heart rate control. 50 mg BID.  - Discussed rate vs rhythm control strategy. Given need for knee surgery which is more pressing to her, we will continue with rate control strategy for now and re-evaluate this after surgeries are complete.  - Continue Eliquis for stroke prevention. - Order stress test to evaluate coronary artery disease and assess shortness of breath. - Consider cardioversion post-knee surgery if symptoms persist.   Chest Pain Shortness of Breath - Unclear if related to Afib vs inactivity. CP sounds non-cardiac. NM stress test.  - If stress test normal, may proceed to surgery.    Moderate to severe tricuspid regurgitation  Moderate pulmonary hypertension  - TR is moderate on my review. Likely Afib related. No signs of HF or volume overload. Conservative approach given age and need for knee surgery.   Hypertension Adequately controlled with metoprolol. - Continue  metoprolol for hypertension management.  Knee Osteoarthritis Preoperative assessment  Severe pain limiting activity, surgery contingent on cardiac clearance. - Proceed with stress test for knee surgery clearance. - Pending normal stress test results, proceed with knee surgery. - Reevaluate cardiac status post-knee surgery to determine further AFib management.          Informed Consent   Shared Decision Making/Informed Consent The risks [chest pain, shortness of breath, cardiac arrhythmias, dizziness, blood pressure fluctuations, myocardial infarction, stroke/transient ischemic attack, nausea, vomiting, allergic reaction, radiation exposure, metallic taste sensation and life-threatening complications (estimated to be 1 in 10,000)], benefits (risk stratification, diagnosing coronary artery disease, treatment guidance) and alternatives of a nuclear stress test were discussed in detail with Jenna Hogan and she agrees to proceed.      Follow-up: Return in about 4 months (around 08/16/2024).   Signed, Lenna Gilford. Flora Lipps, MD, [facility]  [facility]  [address], [address] [place], [place] [address] [phone]  5:44 PM

## 2024-04-16 ENCOUNTER — Ambulatory Visit: Attending: Cardiovascular Disease | Admitting: Cardiovascular Disease

## 2024-04-16 ENCOUNTER — Encounter: Payer: Self-pay | Admitting: Cardiovascular Disease

## 2024-04-16 VITALS — BP 142/98 | HR 92 | Ht 62.0 in | Wt 153.8 lb

## 2024-04-16 DIAGNOSIS — I4819 Other persistent atrial fibrillation: Secondary | ICD-10-CM

## 2024-04-16 DIAGNOSIS — R0602 Shortness of breath: Secondary | ICD-10-CM

## 2024-04-16 DIAGNOSIS — I272 Pulmonary hypertension, unspecified: Secondary | ICD-10-CM

## 2024-04-16 DIAGNOSIS — I361 Nonrheumatic tricuspid (valve) insufficiency: Secondary | ICD-10-CM

## 2024-04-16 NOTE — Patient Instructions (Signed)
   Testing/Procedures:   Your physician has requested that you have a lexiscan myoview. For further information please visit [email or web address] Please follow instruction sheet, as given.   Follow-Up: At [facility], you and your health needs are our priority.  As part of our continuing mission to provide you with exceptional heart care, our providers are all part of one team.  This team includes your primary Cardiologist (physician) and Advanced Practice Providers or APPs (Physician Assistants and Nurse Practitioners) who all work together to provide you with the care you need, when you need it.  Your next appointment:   4 month(s)  Provider:   Jackquelyn Mass MD         1st Floor: - Lobby - Registration  - Pharmacy  - Lab - Cafe  2nd Floor: - PV Lab - Diagnostic Testing (echo, CT, nuclear med)  3rd Floor: - Vacant  4th Floor: - TCTS (cardiothoracic surgery) - AFib Clinic - Structural Heart Clinic - Vascular Surgery  - Vascular Ultrasound  5th Floor: - HeartCare Cardiology (general and EP) - Clinical Pharmacy for coumadin, hypertension, lipid, weight-loss medications, and med management appointments    Valet parking services will be available as well.

## 2024-04-18 ENCOUNTER — Other Ambulatory Visit: Payer: Self-pay | Admitting: Primary Care

## 2024-04-18 DIAGNOSIS — E785 Hyperlipidemia, unspecified: Secondary | ICD-10-CM

## 2024-04-20 ENCOUNTER — Encounter (HOSPITAL_COMMUNITY): Payer: Self-pay

## 2024-04-21 ENCOUNTER — Ambulatory Visit (HOSPITAL_COMMUNITY): Attending: Cardiovascular Disease

## 2024-04-21 DIAGNOSIS — I4819 Other persistent atrial fibrillation: Secondary | ICD-10-CM | POA: Insufficient documentation

## 2024-04-21 LAB — MYOCARDIAL PERFUSION IMAGING
Base ST Depression (mm): 0 mm
LV dias vol: 78 mL (ref 46–106)
LV sys vol: 45 mL
Nuc Stress EF: 43 %
Peak HR: 91 {beats}/min
Rest HR: 78 {beats}/min
Rest Nuclear Isotope Dose: 10.2 mCi
SDS: 1
SRS: 0
SSS: 1
ST Depression (mm): 0 mm
Stress Nuclear Isotope Dose: 32.9 mCi
TID: 0.97

## 2024-04-21 MED ORDER — TECHNETIUM TC 99M TETROFOSMIN IV KIT
10.2000 | PACK | Freq: Once | INTRAVENOUS | Status: AC | PRN
  Administered 2024-04-21: 10.2 via INTRAVENOUS

## 2024-04-21 MED ORDER — REGADENOSON 0.4 MG/5ML IV SOLN
0.4000 mg | Freq: Once | INTRAVENOUS | Status: AC
Start: 2024-04-21 — End: 2024-04-21
  Administered 2024-04-21: 0.4 mg via INTRAVENOUS

## 2024-04-21 MED ORDER — TECHNETIUM TC 99M TETROFOSMIN IV KIT
32.9000 | PACK | Freq: Once | INTRAVENOUS | Status: AC | PRN
  Administered 2024-04-21: 32.9 via INTRAVENOUS

## 2024-04-22 ENCOUNTER — Encounter: Payer: Self-pay | Admitting: Cardiovascular Disease

## 2024-04-24 ENCOUNTER — Other Ambulatory Visit: Payer: Self-pay | Admitting: Primary Care

## 2024-04-24 DIAGNOSIS — I4891 Unspecified atrial fibrillation: Secondary | ICD-10-CM

## 2024-04-24 MED ORDER — APIXABAN 5 MG PO TABS
5.0000 mg | ORAL_TABLET | Freq: Two times a day (BID) | ORAL | 0 refills | Status: DC
Start: 2024-04-24 — End: 2024-08-03

## 2024-05-05 ENCOUNTER — Encounter: Payer: Self-pay | Admitting: Family Medicine

## 2024-05-05 ENCOUNTER — Other Ambulatory Visit: Payer: Self-pay | Admitting: Primary Care

## 2024-05-05 DIAGNOSIS — J3089 Other allergic rhinitis: Secondary | ICD-10-CM

## 2024-05-12 ENCOUNTER — Other Ambulatory Visit

## 2024-05-12 ENCOUNTER — Encounter

## 2024-05-13 ENCOUNTER — Other Ambulatory Visit: Payer: Self-pay | Admitting: Orthopaedic Surgery

## 2024-05-13 ENCOUNTER — Telehealth: Payer: Self-pay

## 2024-05-13 NOTE — Telephone Encounter (Signed)
   Pre-operative Risk Assessment    Patient Name: Jayda Gildon  DOB: 1941/07/31 MRN: [id number]   Date of last office visit: 04/16/24 Jackquelyn Mass, MD Date of next office visit: 08/21/24 Jackquelyn Mass, MD   Request for Surgical Clearance    Procedure:  RIGHT TOTAL KNEE ARTHORPLASTY  Date of Surgery:  Clearance 05/19/24                                Surgeon:  Alphonzo Ask, MD Surgeon's Group or Practice Name:  [facility] Phone number:  [phone] Fax number:  [phone]  ATTN: [facility] SURGERY COORDINATOR   Type of Clearance Requested:   - Medical  - Pharmacy:  Hold Apixaban  (Eliquis )     Type of Anesthesia:  Spinal/ MAC   Additional requests/questions:    SignedCollin Deal   05/13/2024, 4:28 PM

## 2024-05-14 NOTE — Telephone Encounter (Signed)
   Patient Name: Jenna Hogan  DOB: May 03, 1941 MRN: [id number]  Primary Cardiologist: Oneil Bigness, MD  Chart reviewed as part of pre-operative protocol coverage. Given past medical history and time since last visit, based on ACC/AHA guidelines, Brendaly Donnel is at acceptable risk for the planned procedure without further cardiovascular testing.   Per office protocol, patient can hold Eliquis  for 3 days prior to procedure.    I will route this recommendation to the requesting party via Epic fax function and remove from pre-op pool.  Please call with questions.  Ava Boatman, NP 05/14/2024, 11:29 AM

## 2024-05-14 NOTE — Telephone Encounter (Signed)
 Patient with diagnosis of afib on Eliquis  for anticoagulation.    Procedure: RIGHT TOTAL KNEE ARTHORPLASTY  Date of procedure: 05/19/24   CHA2DS2-VASc Score = 4   This indicates a 4.8% annual risk of stroke. The patient's score is based upon: CHF History: 0 HTN History: 1 Diabetes History: 0 Stroke History: 0 Vascular Disease History: 0 Age Score: 2 Gender Score: 1      CrCl 58.6 ml/min Platelet count 240  Patient has not had an Afib/aflutter ablation within the last 3 months or DCCV within the last 30 days  Per office protocol, patient can hold Eliquis  for 3 days prior to procedure.    **This guidance is not considered finalized until pre-operative APP has relayed final recommendations.**

## 2024-05-14 NOTE — Patient Instructions (Signed)
 SURGICAL WAITING ROOM VISITATION  Patients having surgery or a procedure may have no more than 2 support people in the waiting area - these visitors may rotate.    Children under the age of 46 must have an adult with them who is not the patient.  Due to an increase in RSV and influenza rates and associated hospitalizations, children ages 56 and under may not visit patients in [facility] hospitals.  Visitors with respiratory illnesses are discouraged from visiting and should remain at home.  If the patient needs to stay at the hospital during part of their recovery, the visitor guidelines for inpatient rooms apply. Pre-op nurse will coordinate an appropriate time for 1 support person to accompany patient in pre-op.  This support person may not rotate.    Please refer to the [facility] website for the visitor guidelines for Inpatients (after your surgery is over and you are in a regular room).       Your procedure is scheduled on: 05/19/24   Report to [facility] Main Entrance    Report to admitting at : 7:00 AM   Call this number if you have problems the morning of surgery [phone]   Do not eat food :After Midnight.   After Midnight you may have the following liquids until : 6:30 AM DAY OF SURGERY  Water Non-Citrus Juices (without pulp, NO RED-Apple, White grape, White cranberry) Black Coffee (NO MILK/CREAM OR CREAMERS, sugar ok)  Clear Tea (NO MILK/CREAM OR CREAMERS, sugar ok) regular and decaf                             Plain Jell-O (NO RED)                                           Fruit ices (not with fruit pulp, NO RED)                                     Popsicles (NO RED)                                                               Sports drinks like Gatorade (NO RED)   The day of surgery:  Drink ONE (1) Pre-Surgery Clear Ensure at : 6:30 AM the morning of surgery. Drink in one sitting. Do not sip.  This drink was given to you during your hospital  pre-op  appointment visit. Nothing else to drink after completing the  Pre-Surgery Clear Ensure or G2.          If you have questions, please contact your surgeon's office.  FOLLOW ANY ADDITIONAL PRE OP INSTRUCTIONS YOU RECEIVED FROM YOUR SURGEON'S OFFICE!!!   Oral Hygiene is also important to reduce your risk of infection.                                    Remember - BRUSH YOUR TEETH THE MORNING OF SURGERY WITH YOUR REGULAR TOOTHPASTE  DENTURES WILL BE REMOVED PRIOR TO SURGERY PLEASE DO NOT APPLY "Poly grip" OR ADHESIVES!!!   Do NOT smoke after Midnight   Stop all vitamins and herbal supplements 7 days before surgery.   Take these medicines the morning of surgery with A SIP OF WATER: cetirizine,metoprolol .                              You may not have any metal on your body including hair pins, jewelry, and body piercing             Do not wear make-up, lotions, powders, perfumes/cologne, or deodorant  Do not wear nail polish including gel and S&S, artificial/acrylic nails, or any other type of covering on natural nails including finger and toenails. If you have artificial nails, gel coating, etc. that needs to be removed by a nail salon please have this removed prior to surgery or surgery may need to be canceled/ delayed if the surgeon/ anesthesia feels like they are unable to be safely monitored.   Do not shave  48 hours prior to surgery.    Do not bring valuables to the hospital. [facility] IS NOT             RESPONSIBLE   FOR VALUABLES.   Contacts, glasses, dentures or bridgework may not be worn into surgery.   Bring small overnight bag day of surgery.   DO NOT BRING YOUR HOME MEDICATIONS TO THE HOSPITAL. PHARMACY WILL DISPENSE MEDICATIONS LISTED ON YOUR MEDICATION LIST TO YOU DURING YOUR ADMISSION IN THE HOSPITAL!    Patients discharged on the day of surgery will not be allowed to drive home.  Someone NEEDS to stay with you for the first 24 hours after anesthesia.   Special  Instructions: Bring a copy of your healthcare power of attorney and living will documents the day of surgery if you haven't scanned them before.              Please read over the following fact sheets you were given: IF YOU HAVE QUESTIONS ABOUT YOUR PRE-OP INSTRUCTIONS PLEASE CALL [phone]   If you received a COVID test during your pre-op visit  it is requested that you wear a mask when out in public, stay away from anyone that may not be feeling well and notify your surgeon if you develop symptoms. If you test positive for Covid or have been in contact with anyone that has tested positive in the last 10 days please notify you surgeon.      Pre-operative 5 CHG Bath Instructions   You can play a key role in reducing the risk of infection after surgery. Your skin needs to be as free of germs as possible. You can reduce the number of germs on your skin by washing with CHG (chlorhexidine gluconate) soap before surgery. CHG is an antiseptic soap that kills germs and continues to kill germs even after washing.   DO NOT use if you have an allergy to chlorhexidine/CHG or antibacterial soaps. If your skin becomes reddened or irritated, stop using the CHG and notify one of our RNs at [phone].   Please shower with the CHG soap starting 4 days before surgery using the following schedule:     Please keep in mind the following:  DO NOT shave, including legs and underarms, starting the day of your first shower.   You may shave your face at any point before/day of surgery.  Place clean sheets on your bed the day you start using CHG soap. Use a clean washcloth (not used since being washed) for each shower. DO NOT sleep with pets once you start using the CHG.   CHG Shower Instructions:  If you choose to wash your hair and private area, wash first with your normal shampoo/soap.  After you use shampoo/soap, rinse your hair and body thoroughly to remove shampoo/soap residue.  Turn the water OFF and  apply about 3 tablespoons (45 ml) of CHG soap to a CLEAN washcloth.  Apply CHG soap ONLY FROM YOUR NECK DOWN TO YOUR TOES (washing for 3-5 minutes)  DO NOT use CHG soap on face, private areas, open wounds, or sores.  Pay special attention to the area where your surgery is being performed.  If you are having back surgery, having someone wash your back for you may be helpful. Wait 2 minutes after CHG soap is applied, then you may rinse off the CHG soap.  Pat dry with a clean towel  Put on clean clothes/pajamas   If you choose to wear lotion, please use ONLY the CHG-compatible lotions on the back of this paper.     Additional instructions for the day of surgery: DO NOT APPLY any lotions, deodorants, cologne, or perfumes.   Put on clean/comfortable clothes.  Brush your teeth.  Ask your nurse before applying any prescription medications to the skin.   CHG Compatible Lotions   Aveeno Moisturizing lotion  Cetaphil Moisturizing Cream  Cetaphil Moisturizing Lotion  Clairol Herbal Essence Moisturizing Lotion, Dry Skin  Clairol Herbal Essence Moisturizing Lotion, Extra Dry Skin  Clairol Herbal Essence Moisturizing Lotion, Normal Skin  Curel Age Defying Therapeutic Moisturizing Lotion with Alpha Hydroxy  Curel Extreme Care Body Lotion  Curel Soothing Hands Moisturizing Hand Lotion  Curel Therapeutic Moisturizing Cream, Fragrance-Free  Curel Therapeutic Moisturizing Lotion, Fragrance-Free  Curel Therapeutic Moisturizing Lotion, Original Formula  Eucerin Daily Replenishing Lotion  Eucerin Dry Skin Therapy Plus Alpha Hydroxy Crme  Eucerin Dry Skin Therapy Plus Alpha Hydroxy Lotion  Eucerin Original Crme  Eucerin Original Lotion  Eucerin Plus Crme Eucerin Plus Lotion  Eucerin TriLipid Replenishing Lotion  Keri Anti-Bacterial Hand Lotion  Keri Deep Conditioning Original Lotion Dry Skin Formula Softly Scented  Keri Deep Conditioning Original Lotion, Fragrance Free Sensitive Skin Formula   Keri Lotion Fast Absorbing Fragrance Free Sensitive Skin Formula  Keri Lotion Fast Absorbing Softly Scented Dry Skin Formula  Keri Original Lotion  Keri Skin Renewal Lotion Keri Silky Smooth Lotion  Keri Silky Smooth Sensitive Skin Lotion  Nivea Body Creamy Conditioning Oil  Nivea Body Extra Enriched Lotion  Nivea Body Original Lotion  Nivea Body Sheer Moisturizing Lotion Nivea Crme  Nivea Skin Firming Lotion  NutraDerm 30 Skin Lotion  NutraDerm Skin Lotion  NutraDerm Therapeutic Skin Cream  NutraDerm Therapeutic Skin Lotion  ProShield Protective Hand Cream  Provon moisturizing lotion   Incentive Spirometer  An incentive spirometer is a tool that can help keep your lungs clear and active. This tool measures how well you are filling your lungs with each breath. Taking long deep breaths may help reverse or decrease the chance of developing breathing (pulmonary) problems (especially infection) following: A long period of time when you are unable to move or be active. BEFORE THE PROCEDURE  If the spirometer includes an indicator to show your best effort, your nurse or respiratory therapist will set it to a desired goal. If possible, sit up straight or lean slightly forward. Try  not to slouch. Hold the incentive spirometer in an upright position. INSTRUCTIONS FOR USE  Sit on the edge of your bed if possible, or sit up as far as you can in bed or on a chair. Hold the incentive spirometer in an upright position. Breathe out normally. Place the mouthpiece in your mouth and seal your lips tightly around it. Breathe in slowly and as deeply as possible, raising the piston or the ball toward the top of the column. Hold your breath for 3-5 seconds or for as long as possible. Allow the piston or ball to fall to the bottom of the column. Remove the mouthpiece from your mouth and breathe out normally. Rest for a few seconds and repeat Steps 1 through 7 at least 10 times every 1-2 hours when you are  awake. Take your time and take a few normal breaths between deep breaths. The spirometer may include an indicator to show your best effort. Use the indicator as a goal to work toward during each repetition. After each set of 10 deep breaths, practice coughing to be sure your lungs are clear. If you have an incision (the cut made at the time of surgery), support your incision when coughing by placing a pillow or rolled up towels firmly against it. Once you are able to get out of bed, walk around indoors and cough well. You may stop using the incentive spirometer when instructed by your caregiver.  RISKS AND COMPLICATIONS Take your time so you do not get dizzy or light-headed. If you are in pain, you may need to take or ask for pain medication before doing incentive spirometry. It is harder to take a deep breath if you are having pain. AFTER USE Rest and breathe slowly and easily. It can be helpful to keep track of a log of your progress. Your caregiver can provide you with a simple table to help with this. If you are using the spirometer at home, follow these instructions: SEEK MEDICAL CARE IF:  You are having difficultly using the spirometer. You have trouble using the spirometer as often as instructed. Your pain medication is not giving enough relief while using the spirometer. You develop fever of 100.5 F (38.1 C) or higher. SEEK IMMEDIATE MEDICAL CARE IF:  You cough up bloody sputum that had not been present before. You develop fever of 102 F (38.9 C) or greater. You develop worsening pain at or near the incision site. MAKE SURE YOU:  Understand these instructions. Will watch your condition. Will get help right away if you are not doing well or get worse. Document Released: 04/29/2007 Document Revised: 03/10/2012 Document Reviewed: 06/30/2007 [facility] Patient Information 2014 [place], [place].   ________________________________________________________________________

## 2024-05-14 NOTE — Progress Notes (Signed)
Pt. Needs orders for surgery. 

## 2024-05-15 ENCOUNTER — Other Ambulatory Visit: Payer: Self-pay

## 2024-05-15 ENCOUNTER — Encounter (HOSPITAL_COMMUNITY): Payer: Self-pay

## 2024-05-15 ENCOUNTER — Encounter (HOSPITAL_COMMUNITY)
Admission: RE | Admit: 2024-05-15 | Discharge: 2024-05-15 | Disposition: A | Discharge status: Home / Self Care | Source: Ambulatory Visit | Attending: Orthopaedic Surgery | Admitting: Orthopaedic Surgery

## 2024-05-15 VITALS — BP 152/79 | HR 99 | Temp 98.3°F | Ht 62.0 in | Wt 149.0 lb

## 2024-05-15 DIAGNOSIS — Z01818 Encounter for other preprocedural examination: Secondary | ICD-10-CM | POA: Diagnosis present

## 2024-05-15 DIAGNOSIS — Z7901 Long term (current) use of anticoagulants: Secondary | ICD-10-CM | POA: Insufficient documentation

## 2024-05-15 DIAGNOSIS — F419 Anxiety disorder, unspecified: Secondary | ICD-10-CM | POA: Insufficient documentation

## 2024-05-15 DIAGNOSIS — I10 Essential (primary) hypertension: Secondary | ICD-10-CM | POA: Insufficient documentation

## 2024-05-15 DIAGNOSIS — M1711 Unilateral primary osteoarthritis, right knee: Secondary | ICD-10-CM | POA: Diagnosis not present

## 2024-05-15 DIAGNOSIS — Z01812 Encounter for preprocedural laboratory examination: Secondary | ICD-10-CM | POA: Insufficient documentation

## 2024-05-15 DIAGNOSIS — M1990 Unspecified osteoarthritis, unspecified site: Secondary | ICD-10-CM | POA: Diagnosis not present

## 2024-05-15 DIAGNOSIS — I4891 Unspecified atrial fibrillation: Secondary | ICD-10-CM | POA: Diagnosis not present

## 2024-05-15 DIAGNOSIS — I361 Nonrheumatic tricuspid (valve) insufficiency: Secondary | ICD-10-CM | POA: Diagnosis not present

## 2024-05-15 HISTORY — DX: Anxiety disorder, unspecified: F41.9

## 2024-05-15 HISTORY — DX: Unspecified atrial fibrillation: I48.91

## 2024-05-15 HISTORY — DX: Cardiac arrhythmia, unspecified: I49.9

## 2024-05-15 HISTORY — DX: Unspecified osteoarthritis, unspecified site: M19.90

## 2024-05-15 LAB — CBC
HCT: 42.7 % (ref 36.0–46.0)
Hemoglobin: 13.9 g/dL (ref 12.0–15.0)
MCH: 31.6 pg (ref 26.0–34.0)
MCHC: 32.6 g/dL (ref 30.0–36.0)
MCV: 97 fL (ref 80.0–100.0)
Platelets: 249 10*3/uL (ref 150–400)
RBC: 4.4 MIL/uL (ref 3.87–5.11)
RDW: 13.8 % (ref 11.5–15.5)
WBC: 8.1 10*3/uL (ref 4.0–10.5)
nRBC: 0 % (ref 0.0–0.2)

## 2024-05-15 LAB — BASIC METABOLIC PANEL WITH GFR
Anion gap: 10 (ref 5–15)
BUN: 21 mg/dL (ref 8–23)
CO2: 26 mmol/L (ref 22–32)
Calcium: 9 mg/dL (ref 8.9–10.3)
Chloride: 99 mmol/L (ref 98–111)
Creatinine, Ser: 0.64 mg/dL (ref 0.44–1.00)
GFR, Estimated: >60 mL/min (ref >60)
Glucose, Bld: 106 mg/dL — ABNORMAL HIGH (ref 70–99)
Potassium: 4.2 mmol/L (ref 3.5–5.1)
Sodium: 135 mmol/L (ref 135–145)

## 2024-05-15 LAB — SURGICAL PCR SCREEN
MRSA, PCR: NEGATIVE
Staphylococcus aureus: NEGATIVE

## 2024-05-15 NOTE — Progress Notes (Signed)
 For Anesthesia: PCP - Gabriel John, NP  Cardiologist - Andree Kayser, Cathay Clonts, MD . Jenna Hogan: 04/16/24 Clearance: Jenna Hogan: NP: 05/14/24 Bowel Prep reminder:  Chest x-ray -  EKG - 04/16/24 Stress Test -  ECHO - 03/12/24 Cardiac Cath -  Pacemaker/ICD device last checked: Pacemaker orders received: Device Rep notified:  Spinal Cord Stimulator: N/A  Sleep Study - N/A CPAP -   Fasting Blood Sugar - N/A Checks Blood Sugar _____ times a day Date and result of last Hgb A1c-  Last dose of GLP1 agonist- N/A GLP1 instructions:   Last dose of SGLT-2 inhibitors- N/A SGLT-2 instructions:   Blood Thinner Instructions: Eliquis  is on hold since 05/13/24 Aspirin Instructions: Last Dose:  Activity level: Can go up a flight of stairs and activities of daily living without stopping and without chest pain and/or shortness of breath   Able to exercise without chest pain and/or shortness of breath  Anesthesia review: Hx: HTN,Afib.  Patient denies shortness of breath, fever, cough and chest pain at PAT appointment   Patient verbalized understanding of instructions that were given to them at the PAT appointment. Patient was also instructed that they will need to review over the PAT instructions again at home before surgery.

## 2024-05-18 ENCOUNTER — Encounter (HOSPITAL_COMMUNITY): Payer: Self-pay

## 2024-05-18 NOTE — H&P (Signed)
 TOTAL KNEE ADMISSION H&P  Patient is being admitted for right total knee arthroplasty.  Subjective:  Chief Complaint:right knee pain.  HPI: Jenna Hogan, 83 y.o. female, has a history of pain and functional disability in the right knee due to arthritis and has failed non-surgical conservative treatments for greater than 12 weeks to includeNSAID's and/or analgesics, corticosteriod injections, flexibility and strengthening excercises, use of assistive devices, weight reduction as appropriate, and activity modification.  Onset of symptoms was gradual, starting 5 years ago with gradually worsening course since that time. The patient noted no past surgery on the right knee(s).  Patient currently rates pain in the right knee(s) at 10 out of 10 with activity. Patient has night pain, worsening of pain with activity and weight bearing, pain that interferes with activities of daily living, crepitus, and joint swelling.  Patient has evidence of subchondral cysts, subchondral sclerosis, periarticular osteophytes, and joint space narrowing by imaging studies. There is no active infection.  Patient Active Problem List   Diagnosis Date Noted   Osteoarthritis of knees, bilateral 02/12/2024   Environmental and seasonal allergies 02/12/2024   Preoperative clearance 02/12/2024   Atrial fibrillation (HCC) 02/12/2024   Left sided sciatica 09/20/2023   Acute hip pain, left 09/20/2023   Prediabetes 03/28/2023   Lower extremity pain, anterior, left 09/21/2022   Encounter for annual general medical examination with abnormal findings in adult 03/09/2022   Microscopic hematuria 03/09/2022   GAD (generalized anxiety disorder) 03/03/2021   Urinary incontinence 03/03/2021   Chronic back pain 02/17/2020   Medicare annual wellness visit, subsequent 02/17/2020   Hyperlipidemia 06/03/2017   Essential hypertension 06/03/2017   Obesity (BMI 30.0-34.9) 06/03/2017   Past Medical History:  Diagnosis Date   A-fib [facility])     Anxiety    Arthritis    Depression    Dysrhythmia    Essential hypertension    Fatigue    Generalized abdominal pain 03/09/2022   Hypercholesteremia    Urinary incontinence     Past Surgical History:  Procedure Laterality Date   ABDOMINAL HYSTERECTOMY     CARPAL TUNNEL RELEASE  1998, 2002   x2   CATARACT EXTRACTION, BILATERAL     HERNIA REPAIR Bilateral    MENISCUS REPAIR Left 2011    No current facility-administered medications for this encounter.   Current Outpatient Medications  Medication Sig Dispense Refill Last Dose/Taking   apixaban  (ELIQUIS ) 5 MG TABS tablet Take 1 tablet (5 mg total) by mouth 2 (two) times daily. For stroke prevention 180 tablet 0 Taking   Calcium Carb-Cholecalciferol (CALCIUM 600 + D PO) Take 2 tablets by mouth daily.   Taking   cetirizine (ZYRTEC) 10 MG tablet Take 10 mg by mouth daily.   Taking   Cholecalciferol (VITAMIN D) 50 MCG (2000 UT) tablet Take 4,000 Units by mouth daily.   Taking   metoprolol  tartrate (LOPRESSOR ) 50 MG tablet TAKE 1 TABLET (50 MG TOTAL) BY MOUTH 2 (TWO) TIMES DAILY. FOR BLOOD PRESSURE. 180 tablet 3 Taking   Multiple Vitamins-Minerals (PRESERVISION AREDS 2 PO) Take 2 capsules by mouth daily.   Taking   Multiple Vitamins-Minerals (WOMENS 50+ MULTI VITAMIN) TABS Take 1 tablet by mouth daily.   Taking   Probiotic Product (PROBIOTIC PO) Take 1 capsule by mouth daily.   Taking   simvastatin  (ZOCOR ) 40 MG tablet TAKE 1 TABLET BY MOUTH EVERY EVENING FOR CHOLESTEROL 90 tablet 2 Taking   montelukast  (SINGULAIR ) 10 MG tablet TAKE 1 TABLET (10 MG TOTAL) BY MOUTH AT  BEDTIME. FOR ALLERGIES (Patient not taking: Reported on 05/13/2024) 90 tablet 2 Not Taking   sertraline  (ZOLOFT ) 50 MG tablet TAKE 1 TABLET BY MOUTH EVERY DAY FOR ANXIETY (Patient not taking: Reported on 05/13/2024) 90 tablet 2 Not Taking   No Known Allergies  Social History   Tobacco Use   Smoking status: Never   Smokeless tobacco: Never  Substance Use Topics   Alcohol   use: Yes    Comment: social    Family History  Problem Relation Age of Onset   Heart failure Mother    Heart disease Mother    Arthritis Mother    Breast cancer Neg Hx      Review of Systems  Musculoskeletal:  Positive for arthralgias.       Right knee  All other systems reviewed and are negative.   Objective:  Physical Exam Constitutional:      Appearance: Normal appearance.  HENT:     Head: Normocephalic and atraumatic.     Mouth/Throat:     Pharynx: Oropharynx is clear.  Eyes:     Extraocular Movements: Extraocular movements intact.  Pulmonary:     Effort: Pulmonary effort is normal.  Abdominal:     Palpations: Abdomen is soft.  Musculoskeletal:     Cervical back: Normal range of motion.     Comments: Both knees move 0-110.  She has medial greater than lateral joint line pain.  I do not feel an effusion on either side.  There is crepitation.  Hip motion is good and straight leg raise is negative.   Skin:    General: Skin is warm and dry.  Neurological:     General: No focal deficit present.     Mental Status: She is alert and oriented to person, place, and time. Mental status is at baseline.  Psychiatric:        Mood and Affect: Mood normal.        Behavior: Behavior normal.        Thought Content: Thought content normal.        Judgment: Judgment normal.     Vital signs in last 24 hours:    Labs:   Estimated body mass index is 27.25 kg/m as calculated from the following:   Height as of 05/15/24: 5\' 2"  (1.575 m).   Weight as of 05/15/24: 67.6 kg.   Imaging Review Plain radiographs demonstrate severe degenerative joint disease of the right knee(s). The overall alignment isneutral. The bone quality appears to be good for age and reported activity level.      Assessment/Plan:  End stage primary arthritis, right knee   The patient history, physical examination, clinical judgment of the provider and imaging studies are consistent with end stage  degenerative joint disease of the right knee(s) and total knee arthroplasty is deemed medically necessary. The treatment options including medical management, injection therapy arthroscopy and arthroplasty were discussed at length. The risks and benefits of total knee arthroplasty were presented and reviewed. The risks due to aseptic loosening, infection, stiffness, patella tracking problems, thromboembolic complications and other imponderables were discussed. The patient acknowledged the explanation, agreed to proceed with the plan and consent was signed. Patient is being admitted for inpatient treatment for surgery, pain control, PT, OT, prophylactic antibiotics, VTE prophylaxis, progressive ambulation and ADL's and discharge planning. The patient is planning to be discharged home with home health services

## 2024-05-18 NOTE — Progress Notes (Signed)
 Case: [id number] Date/Time: 05/19/24 0915   Procedure: ARTHROPLASTY, KNEE, TOTAL (Right: Knee) - RIGHT TOTAL KNEE ARTHROPLASTY   Anesthesia type: Spinal   Pre-op diagnosis: RIGHT KNEE DEGENERATIVE JOINT DISEASE   Location: [facility] ROOM 06 / [facility]   Surgeons: Dayne Even, MD       DISCUSSION: Jenna Hogan is an 83 yo female who presents to PAT prior to surgery above. PMH of HTN, A.fib on Eliquis , CHF, mod TR, anxiety, arthritis.  Pt recently established care with Cardiology for A.fib. Per Dr. Rolm Clos it likely has been persistent and long standing based of her echo which showed normal LV/RV function, mildly elevated pulmonary pressures, moderate TR. She was advised to undergo stress testing which came back negative for ischemia but did show LVEF was moderately decreased to 30-44%. Further management of A.fib will be deferred till after surgery. She was cleared for upcoming surgery:    "Chart reviewed as part of pre-operative protocol coverage. Given past medical history and time since last visit, based on ACC/AHA guidelines, Jenna Hogan is at acceptable risk for the planned procedure without further cardiovascular testing.    Per office protocol, patient can hold Eliquis  for 3 days prior to procedure. "  LD Eliquis : 5/14  VS: BP (!) 152/79   Pulse 99   Temp 36.8 C (Oral)   Ht 5\' 2"  (1.575 m)   Wt 67.6 kg   SpO2 98%   BMI 27.25 kg/m   PROVIDERS: Gabriel John, NP   LABS: Labs reviewed: Acceptable for surgery. (all labs ordered are listed, but only abnormal results are displayed)  Labs Reviewed  BASIC METABOLIC PANEL WITH GFR - Abnormal; Notable for the following components:      Result Value   Glucose, Bld 106 (*)    All other components within normal limits  SURGICAL PCR SCREEN  CBC     IMAGES:   EKG 04/16/24:  Atrial fibrillation with premature ventricular or aberrantly conducted complexes, rate 92  CV:  Stress test 04/21/24:  Baseline ECG is  abnormal. ECG rhythm shows atrial fibrillation.   A pharmacological stress test was performed using IV Lexiscan  0.4mg  over 10 seconds performed without concurrent submaximal exercise. The patient reported dyspnea and headaches during the stress test. Normal blood pressure and normal heart rate response noted during stress. Heart rate recovery was normal.   No ST deviation was noted. ECG was interpretable and without significant changes. The ECG was not diagnostic due to pharmacologic protocol.   LV perfusion is abnormal. There is no evidence of ischemia. There is no evidence of infarction. Defect 1: There is a small defect with mild reduction in uptake present in the apical apex location(s) that is fixed. Consistent with artifact caused by bowel tracer uptake and diaphragmatic attenuation.   Left ventricular function is abnormal. Global function is mildly reduced. Nuclear stress EF: 43%. The left ventricular ejection fraction is moderately decreased (30-44%). End diastolic cavity size is normal. End systolic cavity size is normal. No evidence of transient ischemic dilation (TID) noted.   Findings are consistent with no ischemia. The study is intermediate risk due to LV dysfunction.  Echo 03/12/24:  IMPRESSIONS    1. Left ventricular ejection fraction, by estimation, is 55%. The left ventricle has severely decreased function. The left ventricle has no regional wall motion abnormalities. Left ventricular diastolic function could not be evaluated.  2. Right ventricular systolic function is normal. The right ventricular size is normal. There is moderately elevated pulmonary artery systolic pressure.  The estimated right ventricular systolic pressure is 45.9 mmHg.  3. Left atrial size was moderately dilated.  4. Right atrial size was moderately dilated.  5. The mitral valve is normal in structure. Mild mitral valve regurgitation. No evidence of mitral stenosis.  6. Tricuspid valve regurgitation is  severe.  7. The aortic valve is tricuspid. There is mild calcification of the aortic valve. Aortic valve regurgitation is trivial. No aortic stenosis is present.  8. The inferior vena cava is dilated in size with <50% respiratory variability, suggesting right atrial pressure of 15 mmHg.  Past Medical History:  Diagnosis Date   A-fib [facility])    Anxiety    Arthritis    Depression    Dysrhythmia    Essential hypertension    Fatigue    Generalized abdominal pain 03/09/2022   Hypercholesteremia    Urinary incontinence     Past Surgical History:  Procedure Laterality Date   ABDOMINAL HYSTERECTOMY     CARPAL TUNNEL RELEASE  1998, 2002   x2   CATARACT EXTRACTION, BILATERAL     HERNIA REPAIR Bilateral    MENISCUS REPAIR Left 2011    MEDICATIONS:  apixaban  (ELIQUIS ) 5 MG TABS tablet   Calcium Carb-Cholecalciferol (CALCIUM 600 + D PO)   cetirizine (ZYRTEC) 10 MG tablet   Cholecalciferol (VITAMIN D) 50 MCG (2000 UT) tablet   metoprolol  tartrate (LOPRESSOR ) 50 MG tablet   montelukast  (SINGULAIR ) 10 MG tablet   Multiple Vitamins-Minerals (PRESERVISION AREDS 2 PO)   Multiple Vitamins-Minerals (WOMENS 50+ MULTI VITAMIN) TABS   Probiotic Product (PROBIOTIC PO)   sertraline  (ZOLOFT ) 50 MG tablet   simvastatin  (ZOCOR ) 40 MG tablet   No current facility-administered medications for this encounter.   Jenna Hogan [facility] Short Stay/Anesthesiology [facility] Phone [phone] 05/18/2024 9:24 AM

## 2024-05-18 NOTE — Anesthesia Preprocedure Evaluation (Addendum)
 Anesthesia Evaluation  Patient identified by MRN, date of birth, ID band Patient awake    Reviewed: Allergy & Precautions, H&P , NPO status , Patient's Chart, lab work & pertinent test results, reviewed documented beta blocker date and time   Airway Mallampati: II  TM Distance: >3 FB Neck ROM: Full    Dental no notable dental hx. (+) Teeth Intact, Dental Advisory Given   Pulmonary neg pulmonary ROS   Pulmonary exam normal breath sounds clear to auscultation       Cardiovascular hypertension, Pt. on medications and Pt. on home beta blockers + dysrhythmias Atrial Fibrillation + Valvular Problems/Murmurs  Rhythm:Regular Rate:Normal     Neuro/Psych   Anxiety Depression    negative neurological ROS     GI/Hepatic negative GI ROS, Neg liver ROS,,,  Endo/Other  negative endocrine ROS    Renal/GU negative Renal ROS  negative genitourinary   Musculoskeletal  (+) Arthritis , Osteoarthritis,    Abdominal   Peds  Hematology negative hematology ROS (+)   Anesthesia Other Findings   Reproductive/Obstetrics negative OB ROS                             Anesthesia Physical Anesthesia Plan  ASA: 3  Anesthesia Plan: Spinal   Post-op Pain Management: Regional block* and Tylenol  PO (pre-op)*   Induction: Intravenous  PONV Risk Score and Plan: 3 and Ondansetron , Dexamethasone and Propofol infusion  Airway Management Planned: Natural Airway and Simple Face Mask  Additional Equipment:   Intra-op Plan:   Post-operative Plan:   Informed Consent: I have reviewed the patients History and Physical, chart, labs and discussed the procedure including the risks, benefits and alternatives for the proposed anesthesia with the patient or authorized representative who has indicated his/her understanding and acceptance.     Dental advisory given  Plan Discussed with: CRNA  Anesthesia Plan Comments: (See PAT  note from 5/16)        Anesthesia Quick Evaluation

## 2024-05-19 ENCOUNTER — Observation Stay (HOSPITAL_COMMUNITY)
Admission: RE | Admit: 2024-05-19 | Discharge: 2024-05-20 | Disposition: A | Discharge status: Home / Self Care | Attending: Orthopaedic Surgery | Admitting: Orthopaedic Surgery

## 2024-05-19 ENCOUNTER — Other Ambulatory Visit: Payer: Self-pay

## 2024-05-19 ENCOUNTER — Ambulatory Visit (HOSPITAL_COMMUNITY): Payer: Self-pay | Admitting: Medical

## 2024-05-19 ENCOUNTER — Ambulatory Visit (HOSPITAL_BASED_OUTPATIENT_CLINIC_OR_DEPARTMENT_OTHER): Admitting: Anesthesiology

## 2024-05-19 ENCOUNTER — Encounter (HOSPITAL_COMMUNITY)
Admission: RE | Disposition: A | Discharge status: Home / Self Care | Payer: Self-pay | Source: Home / Self Care | Attending: Orthopaedic Surgery

## 2024-05-19 ENCOUNTER — Encounter (HOSPITAL_COMMUNITY): Payer: Self-pay | Admitting: Orthopaedic Surgery

## 2024-05-19 DIAGNOSIS — M1711 Unilateral primary osteoarthritis, right knee: Secondary | ICD-10-CM | POA: Diagnosis not present

## 2024-05-19 DIAGNOSIS — I10 Essential (primary) hypertension: Secondary | ICD-10-CM

## 2024-05-19 DIAGNOSIS — G8918 Other acute postprocedural pain: Secondary | ICD-10-CM | POA: Diagnosis not present

## 2024-05-19 DIAGNOSIS — Z79899 Other long term (current) drug therapy: Secondary | ICD-10-CM | POA: Diagnosis not present

## 2024-05-19 DIAGNOSIS — F418 Other specified anxiety disorders: Secondary | ICD-10-CM

## 2024-05-19 DIAGNOSIS — I4891 Unspecified atrial fibrillation: Secondary | ICD-10-CM | POA: Diagnosis not present

## 2024-05-19 DIAGNOSIS — Z7901 Long term (current) use of anticoagulants: Secondary | ICD-10-CM | POA: Insufficient documentation

## 2024-05-19 DIAGNOSIS — Z96651 Presence of right artificial knee joint: Secondary | ICD-10-CM | POA: Diagnosis not present

## 2024-05-19 HISTORY — PX: TOTAL KNEE ARTHROPLASTY: SHX125

## 2024-05-19 SURGERY — ARTHROPLASTY, KNEE, TOTAL
Anesthesia: Spinal | Site: Knee | Laterality: Right

## 2024-05-19 MED ORDER — MENTHOL 3 MG MT LOZG: 1.0000 | LOZENGE | OROMUCOSAL | Status: DC | PRN

## 2024-05-19 MED ORDER — CEFAZOLIN SODIUM-DEXTROSE 2-4 GM/100ML-% IV SOLN
2.0000 g | Freq: Four times a day (QID) | INTRAVENOUS | Status: AC
  Administered 2024-05-19 (×2): 2 g via INTRAVENOUS
  Filled 2024-05-19 (×2): qty 100

## 2024-05-19 MED ORDER — DOCUSATE SODIUM 100 MG PO CAPS
100.0000 mg | ORAL_CAPSULE | Freq: Two times a day (BID) | ORAL | Status: DC
  Administered 2024-05-19 – 2024-05-20 (×2): 100 mg via ORAL
  Filled 2024-05-19 (×2): qty 1

## 2024-05-19 MED ORDER — SIMVASTATIN 40 MG PO TABS
40.0000 mg | ORAL_TABLET | Freq: Every evening | ORAL | Status: DC
  Administered 2024-05-19: 40 mg via ORAL
  Filled 2024-05-19: qty 1

## 2024-05-19 MED ORDER — LACTATED RINGERS IV SOLN: INTRAVENOUS | Status: DC

## 2024-05-19 MED ORDER — TRANEXAMIC ACID 1000 MG/10ML IV SOLN
INTRAVENOUS | Status: DC | PRN
  Administered 2024-05-19: 2000 mg via TOPICAL

## 2024-05-19 MED ORDER — METHOCARBAMOL 500 MG PO TABS
500.0000 mg | ORAL_TABLET | Freq: Four times a day (QID) | ORAL | Status: DC | PRN
  Administered 2024-05-19 – 2024-05-20 (×3): 500 mg via ORAL
  Filled 2024-05-19 (×3): qty 1

## 2024-05-19 MED ORDER — LIDOCAINE HCL (PF) 2 % IJ SOLN
INTRAMUSCULAR | Status: AC
  Filled 2024-05-19: qty 5

## 2024-05-19 MED ORDER — METOCLOPRAMIDE HCL 5 MG PO TABS: 5.0000 mg | ORAL_TABLET | Freq: Three times a day (TID) | ORAL | Status: DC | PRN

## 2024-05-19 MED ORDER — PHENYLEPHRINE HCL-NACL 20-0.9 MG/250ML-% IV SOLN
INTRAVENOUS | Status: DC | PRN
  Administered 2024-05-19: 30 ug/min via INTRAVENOUS

## 2024-05-19 MED ORDER — DEXAMETHASONE SODIUM PHOSPHATE 10 MG/ML IJ SOLN
INTRAMUSCULAR | Status: AC
  Filled 2024-05-19: qty 1

## 2024-05-19 MED ORDER — 0.9 % SODIUM CHLORIDE (POUR BTL) OPTIME
TOPICAL | Status: DC | PRN
  Administered 2024-05-19: 1000 mL

## 2024-05-19 MED ORDER — METOCLOPRAMIDE HCL 5 MG/ML IJ SOLN: 5.0000 mg | Freq: Three times a day (TID) | INTRAMUSCULAR | Status: DC | PRN

## 2024-05-19 MED ORDER — BISACODYL 5 MG PO TBEC: 5.0000 mg | DELAYED_RELEASE_TABLET | Freq: Every day | ORAL | Status: DC | PRN

## 2024-05-19 MED ORDER — ACETAMINOPHEN 500 MG PO TABS
500.0000 mg | ORAL_TABLET | Freq: Four times a day (QID) | ORAL | Status: AC
  Administered 2024-05-19 – 2024-05-20 (×4): 500 mg via ORAL
  Filled 2024-05-19 (×4): qty 1

## 2024-05-19 MED ORDER — MORPHINE SULFATE (PF) 2 MG/ML IV SOLN: 0.5000 mg | INTRAVENOUS | Status: DC | PRN

## 2024-05-19 MED ORDER — BUPIVACAINE-EPINEPHRINE 0.5% -1:200000 IJ SOLN
INTRAMUSCULAR | Status: DC | PRN
  Administered 2024-05-19: 30 mL

## 2024-05-19 MED ORDER — ACETAMINOPHEN 500 MG PO TABS
1000.0000 mg | ORAL_TABLET | Freq: Once | ORAL | Status: AC
  Administered 2024-05-19: 1000 mg via ORAL
  Filled 2024-05-19: qty 2

## 2024-05-19 MED ORDER — LORATADINE 10 MG PO TABS
10.0000 mg | ORAL_TABLET | Freq: Every day | ORAL | Status: DC
Start: 2024-05-19 — End: 2024-05-20
  Administered 2024-05-19 – 2024-05-20 (×2): 10 mg via ORAL
  Filled 2024-05-19 (×2): qty 1

## 2024-05-19 MED ORDER — CEFAZOLIN SODIUM-DEXTROSE 2-4 GM/100ML-% IV SOLN
2.0000 g | INTRAVENOUS | Status: AC
  Administered 2024-05-19: 2 g via INTRAVENOUS
  Filled 2024-05-19: qty 100

## 2024-05-19 MED ORDER — METOPROLOL TARTRATE 50 MG PO TABS
50.0000 mg | ORAL_TABLET | Freq: Two times a day (BID) | ORAL | Status: DC
  Administered 2024-05-19 – 2024-05-20 (×2): 50 mg via ORAL
  Filled 2024-05-19 (×2): qty 1

## 2024-05-19 MED ORDER — PHENOL 1.4 % MT LIQD: 1.0000 | OROMUCOSAL | Status: DC | PRN

## 2024-05-19 MED ORDER — HYDROMORPHONE HCL 1 MG/ML IJ SOLN: 0.2500 mg | INTRAMUSCULAR | Status: DC | PRN

## 2024-05-19 MED ORDER — PROPOFOL 10 MG/ML IV BOLUS
INTRAVENOUS | Status: DC | PRN
  Administered 2024-05-19 (×2): 10 mg via INTRAVENOUS

## 2024-05-19 MED ORDER — POVIDONE-IODINE 10 % EX SWAB: 2.0000 | Freq: Once | CUTANEOUS | Status: DC

## 2024-05-19 MED ORDER — ONDANSETRON HCL 4 MG PO TABS: 4.0000 mg | ORAL_TABLET | Freq: Four times a day (QID) | ORAL | Status: DC | PRN

## 2024-05-19 MED ORDER — ONDANSETRON HCL 4 MG/2ML IJ SOLN
INTRAMUSCULAR | Status: DC | PRN
Start: 2024-05-19 — End: 2024-05-19
  Administered 2024-05-19: 4 mg via INTRAVENOUS

## 2024-05-19 MED ORDER — BUPIVACAINE IN DEXTROSE 0.75-8.25 % IT SOLN
INTRATHECAL | Status: DC | PRN
  Administered 2024-05-19: 1.6 mL via INTRATHECAL

## 2024-05-19 MED ORDER — BUPIVACAINE LIPOSOME 1.3 % IJ SUSP
INTRAMUSCULAR | Status: AC
  Filled 2024-05-19: qty 20

## 2024-05-19 MED ORDER — SODIUM CHLORIDE 0.9 % IR SOLN
Status: DC | PRN
  Administered 2024-05-19: 1000 mL

## 2024-05-19 MED ORDER — PROPOFOL 500 MG/50ML IV EMUL
INTRAVENOUS | Status: DC | PRN
  Administered 2024-05-19: 50 ug/kg/min via INTRAVENOUS

## 2024-05-19 MED ORDER — DIPHENHYDRAMINE HCL 12.5 MG/5ML PO ELIX: 12.5000 mg | ORAL_SOLUTION | ORAL | Status: DC | PRN

## 2024-05-19 MED ORDER — BUPIVACAINE LIPOSOME 1.3 % IJ SUSP: 20.0000 mL | Freq: Once | INTRAMUSCULAR | Status: DC

## 2024-05-19 MED ORDER — BUPIVACAINE-EPINEPHRINE (PF) 0.5% -1:200000 IJ SOLN
INTRAMUSCULAR | Status: DC | PRN
  Administered 2024-05-19: 20 mL via PERINEURAL

## 2024-05-19 MED ORDER — KETOROLAC TROMETHAMINE 15 MG/ML IJ SOLN
7.5000 mg | Freq: Four times a day (QID) | INTRAMUSCULAR | Status: AC
  Administered 2024-05-19 – 2024-05-20 (×4): 7.5 mg via INTRAVENOUS
  Filled 2024-05-19 (×4): qty 1

## 2024-05-19 MED ORDER — CHLORHEXIDINE GLUCONATE 0.12 % MT SOLN
15.0000 mL | Freq: Once | OROMUCOSAL | Status: AC
  Administered 2024-05-19: 15 mL via OROMUCOSAL

## 2024-05-19 MED ORDER — ACETAMINOPHEN 325 MG PO TABS: 325.0000 mg | ORAL_TABLET | Freq: Four times a day (QID) | ORAL | Status: DC | PRN

## 2024-05-19 MED ORDER — TRANEXAMIC ACID 1000 MG/10ML IV SOLN
2000.0000 mg | INTRAVENOUS | Status: DC
Start: 2024-05-19 — End: 2024-05-19
  Filled 2024-05-19: qty 20

## 2024-05-19 MED ORDER — TRANEXAMIC ACID-NACL 1000-0.7 MG/100ML-% IV SOLN
1000.0000 mg | INTRAVENOUS | Status: AC
Start: 2024-05-19 — End: 2024-05-19
  Administered 2024-05-19: 1000 mg via INTRAVENOUS
  Filled 2024-05-19: qty 100

## 2024-05-19 MED ORDER — FENTANYL CITRATE PF 50 MCG/ML IJ SOSY
50.0000 ug | PREFILLED_SYRINGE | INTRAMUSCULAR | Status: AC
  Administered 2024-05-19: 50 ug via INTRAVENOUS
  Filled 2024-05-19: qty 2

## 2024-05-19 MED ORDER — HYDROCODONE-ACETAMINOPHEN 5-325 MG PO TABS
1.0000 | ORAL_TABLET | ORAL | Status: DC | PRN
  Administered 2024-05-20: 2 via ORAL
  Filled 2024-05-19: qty 2

## 2024-05-19 MED ORDER — ORAL CARE MOUTH RINSE: 15.0000 mL | Freq: Once | OROMUCOSAL | Status: AC

## 2024-05-19 MED ORDER — DEXAMETHASONE SODIUM PHOSPHATE 10 MG/ML IJ SOLN
INTRAMUSCULAR | Status: DC | PRN
  Administered 2024-05-19: 10 mg via INTRAVENOUS

## 2024-05-19 MED ORDER — BUPIVACAINE LIPOSOME 1.3 % IJ SUSP
INTRAMUSCULAR | Status: DC | PRN
Start: 2024-05-19 — End: 2024-05-19
  Administered 2024-05-19: 20 mL

## 2024-05-19 MED ORDER — ONDANSETRON HCL 4 MG/2ML IJ SOLN: 4.0000 mg | Freq: Four times a day (QID) | INTRAMUSCULAR | Status: DC | PRN

## 2024-05-19 MED ORDER — APIXABAN 5 MG PO TABS
5.0000 mg | ORAL_TABLET | Freq: Two times a day (BID) | ORAL | Status: DC
  Administered 2024-05-20: 5 mg via ORAL
  Filled 2024-05-19: qty 1

## 2024-05-19 MED ORDER — SODIUM CHLORIDE (PF) 0.9 % IJ SOLN
INTRAMUSCULAR | Status: AC
  Filled 2024-05-19: qty 30

## 2024-05-19 MED ORDER — METHOCARBAMOL 1000 MG/10ML IJ SOLN
500.0000 mg | Freq: Four times a day (QID) | INTRAMUSCULAR | Status: DC | PRN
Start: 2024-05-19 — End: 2024-05-20

## 2024-05-19 MED ORDER — SODIUM CHLORIDE (PF) 0.9 % IJ SOLN
INTRAMUSCULAR | Status: DC | PRN
  Administered 2024-05-19: 30 mL

## 2024-05-19 MED ORDER — ONDANSETRON HCL 4 MG/2ML IJ SOLN
INTRAMUSCULAR | Status: AC
  Filled 2024-05-19: qty 2

## 2024-05-19 MED ORDER — MELATONIN 5 MG PO TABS
5.0000 mg | ORAL_TABLET | Freq: Every evening | ORAL | Status: DC | PRN
  Administered 2024-05-19: 5 mg via ORAL
  Filled 2024-05-19: qty 1

## 2024-05-19 MED ORDER — TRANEXAMIC ACID-NACL 1000-0.7 MG/100ML-% IV SOLN
1000.0000 mg | Freq: Once | INTRAVENOUS | Status: AC
  Administered 2024-05-19: 1000 mg via INTRAVENOUS
  Filled 2024-05-19: qty 100

## 2024-05-19 MED ORDER — BUPIVACAINE-EPINEPHRINE (PF) 0.5% -1:200000 IJ SOLN
INTRAMUSCULAR | Status: AC
  Filled 2024-05-19: qty 30

## 2024-05-19 MED ORDER — PROPOFOL 1000 MG/100ML IV EMUL
INTRAVENOUS | Status: AC
  Filled 2024-05-19: qty 100

## 2024-05-19 MED ORDER — ALUM & MAG HYDROXIDE-SIMETH 200-200-20 MG/5ML PO SUSP: 30.0000 mL | ORAL | Status: DC | PRN

## 2024-05-19 SURGICAL SUPPLY — 50 items
ATTUNE PS FEM RT SZ 5 CEM KNEE (Femur) IMPLANT
ATTUNE PSRP INSR SZ5 8 KNEE (Insert) IMPLANT
BAG COUNTER SPONGE SURGICOUNT (BAG) ×1 IMPLANT
BAG DECANTER FOR FLEXI CONT (MISCELLANEOUS) ×1 IMPLANT
BAG ZIPLOCK 12X15 (MISCELLANEOUS) ×1 IMPLANT
BASE TIBIA ATTUNE KNEE SYS SZ6 (Knees) IMPLANT
BLADE SAGITTAL 25.0X1.19X90 (BLADE) ×1 IMPLANT
BLADE SAW SGTL 11.0X1.19X90.0M (BLADE) ×1 IMPLANT
BLADE SURG SZ10 CARB STEEL (BLADE) ×1 IMPLANT
BNDG ELASTIC 6INX 5YD STR LF (GAUZE/BANDAGES/DRESSINGS) ×1 IMPLANT
BNDG ELASTIC 6X10 VLCR STRL LF (GAUZE/BANDAGES/DRESSINGS) IMPLANT
BOOTIES KNEE HIGH SLOAN (MISCELLANEOUS) ×1 IMPLANT
BOWL SMART MIX CTS (DISPOSABLE) ×1 IMPLANT
CEMENT HV SMART SET (Cement) ×2 IMPLANT
COVER SURGICAL LIGHT HANDLE (MISCELLANEOUS) ×1 IMPLANT
CUFF TRNQT CYL 34X4.125X (TOURNIQUET CUFF) ×1 IMPLANT
DRAPE TOP 10253 STERILE (DRAPES) ×1 IMPLANT
DRAPE U-SHAPE 47X51 STRL (DRAPES) ×1 IMPLANT
DRSG AQUACEL AG ADV 3.5X10 (GAUZE/BANDAGES/DRESSINGS) ×1 IMPLANT
DURAPREP 26ML APPLICATOR (WOUND CARE) ×2 IMPLANT
ELECT PENCIL ROCKER SW 15FT (MISCELLANEOUS) ×1 IMPLANT
ELECT REM PT RETURN 15FT ADLT (MISCELLANEOUS) ×1 IMPLANT
GLOVE BIO SURGEON STRL SZ8 (GLOVE) ×2 IMPLANT
GLOVE BIOGEL PI IND STRL 7.0 (GLOVE) ×1 IMPLANT
GLOVE BIOGEL PI IND STRL 8 (GLOVE) ×2 IMPLANT
GLOVE SURG SYN 7.0 (GLOVE) ×1 IMPLANT
GLOVE SURG SYN 7.0 PF PI (GLOVE) ×1 IMPLANT
GOWN SRG XL LVL 4 BRTHBL STRL (GOWNS) ×1 IMPLANT
GOWN STRL REUS W/ TWL XL LVL3 (GOWN DISPOSABLE) ×2 IMPLANT
HOLDER FOLEY CATH W/STRAP (MISCELLANEOUS) IMPLANT
HOOD PEEL AWAY T7 (MISCELLANEOUS) ×3 IMPLANT
KIT TURNOVER KIT A (KITS) IMPLANT
MANIFOLD NEPTUNE II (INSTRUMENTS) ×1 IMPLANT
NS IRRIG 1000ML POUR BTL (IV SOLUTION) ×1 IMPLANT
PACK TOTAL KNEE CUSTOM (KITS) ×1 IMPLANT
PAD ARMBOARD POSITIONER FOAM (MISCELLANEOUS) ×1 IMPLANT
PATELLA MEDIAL ATTUN 35MM KNEE (Knees) IMPLANT
PIN STEINMAN FIXATION KNEE (PIN) IMPLANT
PROTECTOR NERVE ULNAR (MISCELLANEOUS) ×1 IMPLANT
SET HNDPC FAN SPRY TIP SCT (DISPOSABLE) ×1 IMPLANT
SPIKE FLUID TRANSFER (MISCELLANEOUS) ×2 IMPLANT
SUT ETHIBOND NAB CT1 #1 30IN (SUTURE) ×1 IMPLANT
SUT VIC AB 0 CT1 36 (SUTURE) ×2 IMPLANT
SUT VIC AB 2-0 CT1 TAPERPNT 27 (SUTURE) ×1 IMPLANT
SUT VICRYL AB 3-0 FS1 BRD 27IN (SUTURE) ×1 IMPLANT
SUTURE STRATFX 0 PDS 27 VIOLET (SUTURE) ×1 IMPLANT
TRAY FOLEY MTR SLVR 16FR STAT (SET/KITS/TRAYS/PACK) IMPLANT
WATER STERILE IRR 1000ML POUR (IV SOLUTION) ×2 IMPLANT
WRAP KNEE MAXI GEL POST OP (GAUZE/BANDAGES/DRESSINGS) ×1 IMPLANT
YANKAUER SUCT BULB TIP NO VENT (SUCTIONS) ×1 IMPLANT

## 2024-05-19 NOTE — Op Note (Signed)
 PREOP DIAGNOSIS: DJD RIGHT KNEE POSTOP DIAGNOSIS: same PROCEDURE: RIGHT TKR ANESTHESIA: Spinal and MAC ATTENDING SURGEON: Alphonzo Ask ASSISTANT: Jenna Sachs PA  INDICATIONS FOR PROCEDURE: IChristine Hogan is a 83 y.o. female who has struggled for a long time with pain due to degenerative arthritis of the right knee.  The patient has failed many conservative non-operative measures and at this point has pain which limits the ability to sleep and walk.  The patient is offered total knee replacement.  Informed operative consent was obtained after discussion of possible risks of anesthesia, infection, neurovascular injury, DVT, and death.  The importance of the post-operative rehabilitation protocol to optimize result was stressed extensively with the patient.  SUMMARY OF FINDINGS AND PROCEDURE:  Jenna Hogan was taken to the operative suite where under the above anesthesia a right knee replacement was performed.  There were advanced degenerative changes and the bone quality was fair.  We used the DePuy Attune system and placed size 5 femur, 6 tibia, 35 mm all polyethylene patella, and a size 8 mm spacer.  Jenna Sachs PA-C assisted throughout and was invaluable to the completion of the case in that he helped retract and maintain exposure while I placed components.  He also helped close thereby minimizing OR time.  The patient was admitted for appropriate post-op care to include perioperative antibiotics and mechanical and pharmacologic measures for DVT prophylaxis.  DESCRIPTION OF PROCEDURE:  Jenna Hogan was taken to the operative suite where the above anesthesia was applied.  The patient was positioned supine and prepped and draped in normal sterile fashion.  An appropriate time out was performed.  After the administration of kefzol pre-op antibiotic the leg was elevated and exsanguinated and a tourniquet inflated. A standard longitudinal incision was made on the anterior knee.  Dissection  was carried down to the extensor mechanism.  All appropriate anti-infective measures were used including the pre-operative antibiotic, betadine impregnated drape, and closed hooded exhaust systems for each member of the surgical team.  A medial parapatellar incision was made in the extensor mechanism and the knee cap flipped and the knee flexed.  Some residual meniscal tissues were removed along with any remaining ACL/PCL tissue.  A guide was placed on the tibia and a flat cut was made on it's superior surface.  An intramedullary guide was placed in the femur and was utilized to make anterior and posterior cuts creating an appropriate flexion gap.  A second intramedullary guide was placed in the femur to make a distal cut properly balancing the knee with an extension gap equal to the flexion gap.  The three bones sized to the above mentioned sizes and the appropriate guides were placed and utilized.  A trial reduction was done and the knee easily came to full extension and the patella tracked well on flexion.  The trial components were removed and all bones were cleaned with pulsatile lavage and then dried thoroughly.  Cement was mixed and was pressurized onto the bones followed by placement of the aforementioned components.  Excess cement was trimmed and pressure was held on the components until the cement had hardened.  The tourniquet was deflated and a small amount of bleeding was controlled with cautery and pressure.  The knee was irrigated thoroughly.  The extensor mechanism was re-approximated with #1 ethibond in interrupted fashion.  The knee was flexed and the repair was solid.  The subcutaneous tissues were re-approximated with #0 and #2-0 vicryl and the skin closed with a subcuticular stitch  and steristrips.  A sterile dressing was applied.  Intraoperative fluids, EBL, and tourniquet time can be obtained from anesthesia records.  DISPOSITION:  The patient was taken to recovery room in stable condition  and scheduled to potentially go home same day depending on ability to walk and tolerate liquids..  Margene Cherian G Bryan Omura 05/19/2024, 11:30 AM

## 2024-05-19 NOTE — Anesthesia Procedure Notes (Signed)
 Spinal  Patient location during procedure: OR Start time: 05/19/2024 10:13 AM End time: 05/19/2024 10:15 AM Reason for block: surgical anesthesia Staffing Performed: resident/CRNA  Anesthesiologist: Jake Mayers, MD Resident/CRNA: Micky Albee, CRNA Performed by: Micky Albee, CRNA Authorized by: Jake Mayers, MD   Preanesthetic Checklist Completed: patient identified, IV checked, site marked, risks and benefits discussed, surgical consent, monitors and equipment checked, pre-op evaluation and timeout performed Spinal Block Patient position: sitting Prep: DuraPrep Patient monitoring: heart rate, cardiac monitor, continuous pulse ox and blood pressure Approach: midline Location: L3-4 Injection technique: single-shot Needle Needle type: Pencan  Needle gauge: 24 G Needle length: 10 cm Needle insertion depth: 7 cm Assessment Sensory level: T6 Events: CSF return Additional Notes Timeout performed. Patient in sitting position. L3-4 identified. SAB without difficulty. To supine position.

## 2024-05-19 NOTE — Interval H&P Note (Signed)
 History and Physical Interval Note:  05/19/2024 9:12 AM  Jenna Hogan  has presented today for surgery, with the diagnosis of RIGHT KNEE DEGENERATIVE JOINT DISEASE.  The various methods of treatment have been discussed with the patient and family. After consideration of risks, benefits and other options for treatment, the patient has consented to  Procedure(s) with comments: ARTHROPLASTY, KNEE, TOTAL (Right) - RIGHT TOTAL KNEE ARTHROPLASTY as a surgical intervention.  The patient's history has been reviewed, patient examined, no change in status, stable for surgery.  I have reviewed the patient's chart and labs.  Questions were answered to the patient's satisfaction.     Jamien Casanova G Ginnette Gates

## 2024-05-19 NOTE — Anesthesia Procedure Notes (Signed)
 Anesthesia Regional Block: Adductor canal block   Pre-Anesthetic Checklist: , timeout performed,  Correct Patient, Correct Site, Correct Laterality,  Correct Procedure, Correct Position, site marked,  Risks and benefits discussed,  Pre-op evaluation,  At surgeon's request and post-op pain management  Laterality: Right  Prep: Maximum Sterile Barrier Precautions used, chloraprep       Needles:  Injection technique: Single-shot  Needle Type: Echogenic Stimulator Needle     Needle Length: 9cm  Needle Gauge: 21     Additional Needles:   Procedures:,,,, ultrasound used (permanent image in chart),,    Narrative:  Start time: 05/19/2024 8:36 AM End time: 05/19/2024 8:46 AM Injection made incrementally with aspirations every 5 mL.  Performed by: Personally  Anesthesiologist: Jake Mayers, MD

## 2024-05-19 NOTE — Anesthesia Postprocedure Evaluation (Signed)
 Anesthesia Post Note  Patient: Jenna Hogan  Procedure(s) Performed: ARTHROPLASTY, KNEE, TOTAL (Right: Knee)     Patient location during evaluation: PACU Anesthesia Type: Spinal and Regional Level of consciousness: oriented and awake and alert Pain management: pain level controlled Vital Signs Assessment: post-procedure vital signs reviewed and stable Respiratory status: spontaneous breathing and respiratory function stable Cardiovascular status: blood pressure returned to baseline and stable Postop Assessment: no headache, no backache, no apparent nausea or vomiting, spinal receding and patient able to bend at knees Anesthetic complications: no  No notable events documented.  Last Vitals:  Vitals:   05/19/24 1400 05/19/24 1415  BP: 125/72 123/63  Pulse: 80 77  Resp: 18 20  Temp: (!) 36.4 C   SpO2: 96% 95%    Last Pain:  Vitals:   05/19/24 1400  TempSrc:   PainSc: 0-No pain                 Kayal Mula,W. EDMOND

## 2024-05-19 NOTE — Transfer of Care (Signed)
 Immediate Anesthesia Transfer of Care Note  Patient: Jannatul Wojdyla  Procedure(s) Performed: ARTHROPLASTY, KNEE, TOTAL (Right: Knee)  Patient Location: PACU  Anesthesia Type:Spinal  Level of Consciousness: sedated  Airway & Oxygen Therapy: Patient Spontanous Breathing and Patient connected to face mask oxygen  Post-op Assessment: Report given to RN and Post -op Vital signs reviewed and stable  Post vital signs: Reviewed and stable  Last Vitals:  Vitals Value Taken Time  BP    Temp    Pulse 77 05/19/24 1156  Resp 21 05/19/24 1156  SpO2 100 % 05/19/24 1156  Vitals shown include unfiled device data.  Last Pain:  Vitals:   05/19/24 0915  TempSrc:   PainSc: 0-No pain         Complications: No notable events documented.

## 2024-05-19 NOTE — Evaluation (Signed)
 Physical Therapy Evaluation Patient Details Name: Jenna Hogan MRN: [id number] DOB: 05-13-1941 Today's Date: 05/19/2024  History of Present Illness  83 yo female presents to therapy s/p R TKA on 05/19/2024 due to failure of conservative measures. Pt PMH includes but is not limited to: OA, A-fib, L hip  and LE pain, CAD, chronic LBP, HLD, HTN, and carpal tunnel release.  Clinical Impression    Jenna Hogan is a 83 y.o. female POD 0 s/p R TKA. Patient reports mod I with mobility at baseline. Patient is now limited by functional impairments (see PT problem list below) and requires CGA for bed mobility and min A and cues for transfers. Patient was able to ambulate 20 feet with RW and CGA level of assist. Patient instructed in exercise to facilitate ROM and circulation to manage edema. P Patient will benefit from continued skilled PT interventions to address impairments and progress towards PLOF. Acute PT will follow to progress mobility and stair training in preparation for safe discharge home with family support and [facility] services.       If plan is discharge home, recommend the following: A little help with walking and/or transfers;A little help with bathing/dressing/bathroom;Assistance with cooking/housework;Assist for transportation;Help with stairs or ramp for entrance   Can travel by private vehicle        Equipment Recommendations Rolling walker (2 wheels)  Recommendations for Other Services       Functional Status Assessment Patient has had a recent decline in their functional status and demonstrates the ability to make significant improvements in function in a reasonable and predictable amount of time.     Precautions / Restrictions Precautions Precautions: Knee;Fall Restrictions Weight Bearing Restrictions Per Provider Order: No      Mobility  Bed Mobility Overal bed mobility: Needs Assistance Bed Mobility: Supine to Sit     Supine to sit: Contact guard, HOB  elevated, Used rails     General bed mobility comments: cues and increased time    Transfers Overall transfer level: Needs assistance Equipment used: Rolling walker (2 wheels) Transfers: Sit to/from Stand Sit to Stand: Min assist           General transfer comment: cues able to push to stand with one UE aprehensive about first time standing    Ambulation/Gait Ambulation/Gait assistance: Contact guard assist Gait Distance (Feet): 20 Feet Assistive device: Rolling walker (2 wheels) Gait Pattern/deviations: Step-to pattern, Decreased stance time - right, Antalgic, Trunk flexed Gait velocity: decreased     General Gait Details: heavy reliance on B UE support at RW to offload R LE in stance phase, limited B foot clearance and short stride length with pt requring one standing theraputic rest break with gait tasks  Stairs            Wheelchair Mobility     Tilt Bed    Modified Rankin (Stroke Patients Only)       Balance Overall balance assessment: Needs assistance Sitting-balance support: Feet supported Sitting balance-Leahy Scale: Good     Standing balance support: Bilateral upper extremity supported, Reliant on assistive device for balance, During functional activity Standing balance-Leahy Scale: Poor                               Pertinent Vitals/Pain Pain Assessment Pain Assessment: 0-10 Pain Score: 4  Pain Location: R knee and LE Pain Descriptors / Indicators: Aching, Constant, Discomfort, Dull, Guarding, Grimacing Pain Intervention(s): Limited activity within  patient's tolerance, Monitored during session, Premedicated before session, Repositioned, RN gave pain meds during session, Ice applied    Home Living Family/patient expects to be discharged to:: Private residence Living Arrangements: Alone Available Help at Discharge: Family Type of Home: Mobile home Home Access: Stairs to enter Entrance Stairs-Rails: Left Entrance Stairs-Number  of Steps: 4   Home Layout: One level Home Equipment: Cane - single point      Prior Function Prior Level of Function : Independent/Modified Independent;Driving             Mobility Comments: mod I for all ADLs self care tasks and IADLs with use of [facility]       Extremity/Trunk Assessment        Lower Extremity Assessment Lower Extremity Assessment: RLE deficits/detail RLE Deficits / Details: ankle DF/PF 4/5; SLR < 10 degree lag RLE Sensation: WNL    Cervical / Trunk Assessment Cervical / Trunk Assessment: Normal  Communication   Communication Communication: No apparent difficulties    Cognition Arousal: Alert Behavior During Therapy: WFL for tasks assessed/performed   PT - Cognitive impairments: No apparent impairments                         Following commands: Intact       Cueing       General Comments      Exercises Total Joint Exercises Ankle Circles/Pumps: AROM, Both, 10 reps   Assessment/Plan    PT Assessment Patient needs continued PT services  PT Problem List Decreased strength;Decreased range of motion;Decreased activity tolerance;Decreased balance;Decreased mobility;Decreased coordination;Pain       PT Treatment Interventions DME instruction;Gait training;Stair training;Functional mobility training;Therapeutic activities;Therapeutic exercise;Balance training;Neuromuscular re-education;Patient/family education;Modalities    PT Goals (Current goals can be found in the Care Plan section)  Acute Rehab PT Goals Patient Stated Goal: to get back to doing everything PT Goal Formulation: With patient Time For Goal Achievement: 06/02/24 Potential to Achieve Goals: Good    Frequency 7X/week     Co-evaluation               AM-PAC PT "6 Clicks" Mobility  Outcome Measure Help needed turning from your back to your side while in a flat bed without using bedrails?: A Little Help needed moving from lying on your back to sitting on the  side of a flat bed without using bedrails?: A Little Help needed moving to and from a bed to a chair (including a wheelchair)?: A Little Help needed standing up from a chair using your arms (e.g., wheelchair or bedside chair)?: A Little Help needed to walk in hospital room?: A Little Help needed climbing 3-5 steps with a railing? : A Lot 6 Click Score: 17    End of Session Equipment Utilized During Treatment: Gait belt Activity Tolerance: Patient tolerated treatment well Patient left: in chair;with call bell/phone within reach Nurse Communication: Mobility status PT Visit Diagnosis: Unsteadiness on feet (R26.81);Other abnormalities of gait and mobility (R26.89);Muscle weakness (generalized) (M62.81);Difficulty in walking, not elsewhere classified (R26.2);Pain Pain - Right/Left: Right Pain - part of body: Leg;Knee    Time: 1478-2956 PT Time Calculation (min) (ACUTE ONLY): 23 min   Charges:   PT Evaluation $PT Eval Low Complexity: 1 Low PT Treatments $Gait Training: 8-22 mins PT General Charges $$ ACUTE PT VISIT: 1 Visit         Cary Clarks, PT Acute Rehab\  Annalee Kiang 05/19/2024, 7:00 PM

## 2024-05-19 NOTE — Plan of Care (Signed)
  Problem: Education: Goal: Knowledge of the prescribed therapeutic regimen will improve Outcome: Progressing   Problem: Activity: Goal: Ability to avoid complications of mobility impairment will improve Outcome: Progressing   Problem: Pain Management: Goal: Pain level will decrease with appropriate interventions Outcome: Progressing   Problem: Education: Goal: Knowledge of the prescribed therapeutic regimen will improve Outcome: Progressing   Problem: Activity: Goal: Ability to avoid complications of mobility impairment will improve Outcome: Progressing   Problem: Pain Management: Goal: Pain level will decrease with appropriate interventions Outcome: Progressing

## 2024-05-20 DIAGNOSIS — M1711 Unilateral primary osteoarthritis, right knee: Secondary | ICD-10-CM | POA: Diagnosis not present

## 2024-05-20 DIAGNOSIS — Z96651 Presence of right artificial knee joint: Secondary | ICD-10-CM | POA: Diagnosis not present

## 2024-05-20 MED ORDER — DEXAMETHASONE SODIUM PHOSPHATE 10 MG/ML IJ SOLN
INTRAMUSCULAR | Status: AC
  Filled 2024-05-20: qty 1

## 2024-05-20 MED ORDER — ONDANSETRON HCL 4 MG/2ML IJ SOLN
INTRAMUSCULAR | Status: AC
  Filled 2024-05-20: qty 2

## 2024-05-20 NOTE — Progress Notes (Signed)
 Physical Therapy Treatment Patient Details Name: Jenna Hogan MRN: [id number] DOB: [birth year] Today's Date: 05/20/2024   History of Present Illness 83 yo female presents to therapy s/p R TKA on 05/19/2024 due to failure of conservative measures. Pt PMH includes but is not limited to: OA, A-fib, L hip  and LE pain, CAD, chronic LBP, HLD, HTN, and carpal tunnel release.    PT Comments  Pt tolerated increased ambulation distance of 78' with RW with cues for sequencing and positioning in RW. Instructed pt/son in TKA HEP. Will plan to do stair training during second session today.    If plan is discharge home, recommend the following: A little help with walking and/or transfers;A little help with bathing/dressing/bathroom;Assistance with cooking/housework;Assist for transportation;Help with stairs or ramp for entrance   Can travel by private vehicle        Equipment Recommendations  Rolling walker (2 wheels)    Recommendations for Other Services       Precautions / Restrictions Precautions Precautions: Knee;Fall Precaution Booklet Issued: Yes (comment) Recall of Precautions/Restrictions: Intact Precaution/Restrictions Comments: reviewed no pillow under knee Restrictions Weight Bearing Restrictions Per Provider Order: No RLE Weight Bearing Per Provider Order: Weight bearing as tolerated     Mobility  Bed Mobility Overal bed mobility: Needs Assistance Bed Mobility: Supine to Sit     Supine to sit: Supervision     General bed mobility comments: cues and increased time, gait belt used as RLE lifter    Transfers Overall transfer level: Needs assistance Equipment used: Rolling walker (2 wheels) Transfers: Sit to/from Stand Sit to Stand: Min assist           General transfer comment: cues for hand placement, min A to power up    Ambulation/Gait Ambulation/Gait assistance: Contact guard assist Gait Distance (Feet): 35 Feet Assistive device: Rolling walker (2  wheels) Gait Pattern/deviations: Step-to pattern, Decreased stance time - right, Antalgic, Trunk flexed Gait velocity: decreased     General Gait Details: VCs for sequencing and positioning in RW, no loss of balance   Stairs             Wheelchair Mobility     Tilt Bed    Modified Rankin (Stroke Patients Only)       Balance Overall balance assessment: Needs assistance Sitting-balance support: Feet supported Sitting balance-Leahy Scale: Good     Standing balance support: Bilateral upper extremity supported, Reliant on assistive device for balance, During functional activity Standing balance-Leahy Scale: Poor                              Communication Communication Communication: No apparent difficulties  Cognition Arousal: Alert Behavior During Therapy: WFL for tasks assessed/performed   PT - Cognitive impairments: No apparent impairments                         Following commands: Intact      Cueing    Exercises Total Joint Exercises Ankle Circles/Pumps: AROM, Both, 10 reps Quad Sets: AROM, Both, 5 reps, Supine Short Arc Quad: AROM, Right, 10 reps, Supine Heel Slides: AAROM, Right, 10 reps, Supine Hip ABduction/ADduction: AAROM, Right, 10 reps, Supine Straight Leg Raises: AROM, Right, 5 reps, Supine Long Arc Quad: AROM, Right, 10 reps, Seated Knee Flexion: AAROM, Right, 10 reps, Seated Goniometric ROM: 5-70* AAROM R knee    General Comments        Pertinent Vitals/Pain Pain  Assessment Pain Score: 5  Pain Location: R knee Pain Descriptors / Indicators: Aching, Constant, Discomfort, Dull, Guarding, Grimacing Pain Intervention(s): Limited activity within patient's tolerance, Monitored during session, Premedicated before session, Ice applied, Repositioned    Home Living                          Prior Function            PT Goals (current goals can now be found in the care plan section) Acute Rehab PT  Goals Patient Stated Goal: to get back to doing everything PT Goal Formulation: With patient Time For Goal Achievement: 06/02/24 Potential to Achieve Goals: Good Progress towards PT goals: Progressing toward goals    Frequency    7X/week      PT Plan      Co-evaluation              AM-PAC PT "6 Clicks" Mobility   Outcome Measure  Help needed turning from your back to your side while in a flat bed without using bedrails?: A Little Help needed moving from lying on your back to sitting on the side of a flat bed without using bedrails?: A Little Help needed moving to and from a bed to a chair (including a wheelchair)?: A Little Help needed standing up from a chair using your arms (e.g., wheelchair or bedside chair)?: A Little Help needed to walk in hospital room?: A Little Help needed climbing 3-5 steps with a railing? : A Lot 6 Click Score: 17    End of Session Equipment Utilized During Treatment: Gait belt Activity Tolerance: Patient tolerated treatment well Patient left: in chair;with call bell/phone within reach;with family/visitor present;with nursing/sitter in room Nurse Communication: Mobility status PT Visit Diagnosis: Unsteadiness on feet (R26.81);Other abnormalities of gait and mobility (R26.89);Muscle weakness (generalized) (M62.81);Difficulty in walking, not elsewhere classified (R26.2);Pain Pain - Right/Left: Right Pain - part of body: Leg;Knee     Time: 1610-9604 PT Time Calculation (min) (ACUTE ONLY): 40 min  Charges:    $Gait Training: 8-22 mins $Therapeutic Exercise: 8-22 mins $Therapeutic Activity: 8-22 mins PT General Charges $$ ACUTE PT VISIT: 1 Visit                     Daymon Evans PT 05/20/2024  Acute Rehabilitation Services  Office [phone]

## 2024-05-20 NOTE — Plan of Care (Signed)
  Problem: Education: Goal: Knowledge of General Education information will improve Description: Including pain rating scale, medication(s)/side effects and non-pharmacologic comfort measures Outcome: Progressing   Problem: Health Behavior/Discharge Planning: Goal: Ability to manage health-related needs will improve Outcome: Progressing   Problem: Clinical Measurements: Goal: Ability to maintain clinical measurements within normal limits will improve Outcome: Progressing Goal: Will remain free from infection Outcome: Progressing Goal: Diagnostic test results will improve Outcome: Progressing Goal: Respiratory complications will improve Outcome: Progressing Goal: Cardiovascular complication will be avoided Outcome: Progressing   Problem: Activity: Goal: Risk for activity intolerance will decrease Outcome: Progressing   Problem: Nutrition: Goal: Adequate nutrition will be maintained Outcome: Progressing   Problem: Coping: Goal: Level of anxiety will decrease Outcome: Progressing   Problem: Elimination: Goal: Will not experience complications related to bowel motility Outcome: Progressing Goal: Will not experience complications related to urinary retention Outcome: Progressing   Problem: Pain Managment: Goal: General experience of comfort will improve and/or be controlled Outcome: Progressing   Problem: Safety: Goal: Ability to remain free from injury will improve Outcome: Progressing   Problem: Skin Integrity: Goal: Risk for impaired skin integrity will decrease Outcome: Progressing   Problem: Education: Goal: Knowledge of the prescribed therapeutic regimen will improve Outcome: Progressing Goal: Individualized Educational Video(s) Outcome: Progressing   Problem: Activity: Goal: Ability to avoid complications of mobility impairment will improve Outcome: Progressing Goal: Range of joint motion will improve Outcome: Progressing   Problem: Clinical  Measurements: Goal: Postoperative complications will be avoided or minimized Outcome: Progressing   Problem: Pain Management: Goal: Pain level will decrease with appropriate interventions Outcome: Progressing   Problem: Education: Goal: Knowledge of the prescribed therapeutic regimen will improve Outcome: Progressing Goal: Individualized Educational Video(s) Outcome: Progressing   Problem: Activity: Goal: Ability to avoid complications of mobility impairment will improve Outcome: Progressing Goal: Range of joint motion will improve Outcome: Progressing   Problem: Clinical Measurements: Goal: Postoperative complications will be avoided or minimized Outcome: Progressing   Problem: Pain Management: Goal: Pain level will decrease with appropriate interventions Outcome: Progressing

## 2024-05-20 NOTE — TOC Transition Note (Signed)
 Transition of Care [facility]) - Discharge Note   Patient Details  Name: Jenna Hogan MRN: [id number] Date of Birth: 1941-11-18  [facility] CM/SW Contact:  Delilah Fend, LCSW Phone Number: 05/20/2024, 12:15 PM   Clinical Narrative:     Met with pt and confirming she has received RW to room via [employer].  HHPT prearranged with [facility] via ortho MD office prior to surgery.  No further TOC needs.  Final next level of care: Home w Home Health Services Barriers to Discharge: No Barriers Identified   Patient Goals and CMS Choice Patient states their goals for this hospitalization and ongoing recovery are:: return home          Discharge Placement                       Discharge Plan and Services Additional resources added to the After Visit Summary for                  DME Arranged: Walker rolling DME Agency: [employer]       HH Arranged: PT HH Agency: [facility]        Social Drivers of Health (SDOH) Interventions SDOH Screenings   Food Insecurity: No Food Insecurity (05/19/2024)  Housing: Low Risk  (05/19/2024)  Transportation Needs: No Transportation Needs (05/19/2024)  Utilities: Not At Risk (05/19/2024)  Alcohol  Screen: Low Risk  (03/11/2024)  Depression (PHQ2-9): Low Risk  (03/11/2024)  Financial Resource Strain: Low Risk  (03/11/2024)  Physical Activity: Insufficiently Active (03/11/2024)  Social Connections: Moderately Integrated (05/19/2024)  Stress: No Stress Concern Present (03/11/2024)  Tobacco Use: Low Risk  (05/19/2024)  Health Literacy: Adequate Health Literacy (03/11/2024)     Readmission Risk Interventions     No data to display

## 2024-05-20 NOTE — Progress Notes (Signed)
 Subjective: 1 Day Post-Op Procedure(s) (LRB): ARTHROPLASTY, KNEE, TOTAL (Right)  Patient is doing great. She has minimal pain. She is hoping to go home today.  Activity level:  wbat Diet tolerance:  ok Voiding:  ok Patient reports pain as mild.    Objective: Vital signs in last 24 hours: Temp:  [96.6 F (35.9 C)-98.2 F (36.8 C)] 98.2 F (36.8 C) (05/21 0636) Pulse Rate:  [44-102] 73 (05/21 0636) Resp:  [12-21] 17 (05/21 0636) BP: (93-165)/(62-119) 140/88 (05/21 0636) SpO2:  [93 %-100 %] 100 % (05/21 0636)  Labs: No results for input(s): "HGB" in the last 72 hours. No results for input(s): "WBC", "RBC", "HCT", "PLT" in the last 72 hours. No results for input(s): "NA", "K", "CL", "CO2", "BUN", "CREATININE", "GLUCOSE", "CALCIUM" in the last 72 hours. No results for input(s): "LABPT", "INR" in the last 72 hours.  Physical Exam:  Neurologically intact ABD soft Neurovascular intact Sensation intact distally Intact pulses distally Dorsiflexion/Plantar flexion intact Incision: dressing C/D/I and no drainage No cellulitis present Compartment soft  Assessment/Plan:  1 Day Post-Op Procedure(s) (LRB): ARTHROPLASTY, KNEE, TOTAL (Right) Advance diet Up with therapy Discharge home with home health today once cleared by PT if still doing well. Follow up in office 2 weeks post op. Resume baseline eliquis  today.    Geraldina Klinefelter Ladonya Jerkins 05/20/2024, 8:13 AM

## 2024-05-20 NOTE — Progress Notes (Addendum)
 Physical Therapy Treatment Patient Details Name: Jenna Hogan MRN: [id number] DOB: 05/19/1941 Today's Date: 05/20/2024   History of Present Illness 83 yo female presents to therapy s/p R TKA on 05/19/2024 due to failure of conservative measures. Pt PMH includes but is not limited to: OA, A-fib, L hip  and LE pain, CAD, chronic LBP, HLD, HTN, and carpal tunnel release.    PT Comments  Pt is progressing well with mobility, she tolerated increased ambulation distance of 68' with RW, no loss of balance. Stair training completed. She is ready to DC home from a PT standpoint.     If plan is discharge home, recommend the following: A little help with walking and/or transfers;A little help with bathing/dressing/bathroom;Assistance with cooking/housework;Assist for transportation;Help with stairs or ramp for entrance   Can travel by private vehicle        Equipment Recommendations  Rolling walker (2 wheels)    Recommendations for Other Services       Precautions / Restrictions Precautions Precautions: Knee;Fall Precaution Booklet Issued: Yes (comment) Recall of Precautions/Restrictions: Intact Precaution/Restrictions Comments: reviewed no pillow under knee Restrictions Weight Bearing Restrictions Per Provider Order: No RLE Weight Bearing Per Provider Order: Weight bearing as tolerated     Mobility  Bed Mobility Overal bed mobility: Needs Assistance Bed Mobility: Supine to Sit     Supine to sit: Supervision     General bed mobility comments: up in recliner    Transfers Overall transfer level: Needs assistance Equipment used: Rolling walker (2 wheels) Transfers: Sit to/from Stand Sit to Stand: Contact guard assist           General transfer comment: cues for hand placement    Ambulation/Gait Ambulation/Gait assistance: Contact guard assist Gait Distance (Feet): 60 Feet Assistive device: Rolling walker (2 wheels) Gait Pattern/deviations: Step-to pattern, Decreased  stance time - right, Antalgic, Trunk flexed Gait velocity: decreased     General Gait Details: VCs for sequencing and positioning in RW, no loss of balance, Distance limited by pain and fatigue.   Stairs Stairs: Yes Stairs assistance: Contact guard assist Stair Management: One rail Left, Forwards, With cane, Step to pattern Number of Stairs: 3 General stair comments: VCs sequencing, son present   Wheelchair Mobility     Tilt Bed    Modified Rankin (Stroke Patients Only)       Balance Overall balance assessment: Needs assistance Sitting-balance support: Feet supported Sitting balance-Leahy Scale: Good     Standing balance support: Bilateral upper extremity supported, Reliant on assistive device for balance, During functional activity Standing balance-Leahy Scale: Poor                              Communication Communication Communication: No apparent difficulties  Cognition Arousal: Alert Behavior During Therapy: WFL for tasks assessed/performed   PT - Cognitive impairments: No apparent impairments                         Following commands: Intact      Cueing       General Comments        Pertinent Vitals/Pain Pain Assessment Pain Score: 4  Pain Location: R knee Pain Descriptors / Indicators: Aching, Constant, Discomfort, Dull, Guarding, Grimacing Pain Intervention(s): Limited activity within patient's tolerance, Monitored during session, Premedicated before session, Ice applied, Repositioned    Home Living  Prior Function            PT Goals (current goals can now be found in the care plan section) Acute Rehab PT Goals Patient Stated Goal: to get back to doing everything PT Goal Formulation: With patient Time For Goal Achievement: 06/02/24 Potential to Achieve Goals: Good Progress towards PT goals: Progressing toward goals    Frequency    7X/week      PT Plan       Co-evaluation              AM-PAC PT "6 Clicks" Mobility   Outcome Measure  Help needed turning from your back to your side while in a flat bed without using bedrails?: A Little Help needed moving from lying on your back to sitting on the side of a flat bed without using bedrails?: A Little Help needed moving to and from a bed to a chair (including a wheelchair)?: None Help needed standing up from a chair using your arms (e.g., wheelchair or bedside chair)?: None Help needed to walk in hospital room?: None Help needed climbing 3-5 steps with a railing? : A Little 6 Click Score: 21    End of Session Equipment Utilized During Treatment: Gait belt Activity Tolerance: Patient limited by pain;Patient tolerated treatment well Patient left: in chair;with call bell/phone within reach;with family/visitor present Nurse Communication: Mobility status PT Visit Diagnosis: Unsteadiness on feet (R26.81);Other abnormalities of gait and mobility (R26.89);Muscle weakness (generalized) (M62.81);Difficulty in walking, not elsewhere classified (R26.2);Pain Pain - Right/Left: Right Pain - part of body: Leg;Knee     Time: 9147-8295 PT Time Calculation (min) (ACUTE ONLY): 24 min  Charges:    $Gait Training: 8-22 mins  $Therapeutic Activity: 8-22 mins PT General Charges $$ ACUTE PT VISIT: 1 Visit                     Daymon Evans PT 05/20/2024  Acute Rehabilitation Services  Office [phone]

## 2024-05-20 NOTE — Discharge Summary (Signed)
 Patient ID: Jenna Hogan MRN: [id number] DOB/AGE: [birth year] 83 y.o.  Admit date: 05/19/2024 Discharge date: 05/20/2024  Admission Diagnoses:  Principal Problem:   Primary localized osteoarthritis of right knee Active Problems:   Primary osteoarthritis of right knee   Discharge Diagnoses:  Same  Past Medical History:  Diagnosis Date   A-fib [facility])    Anxiety    Arthritis    Depression    Dysrhythmia    Essential hypertension    Fatigue    Generalized abdominal pain 03/09/2022   Hypercholesteremia    Urinary incontinence     Surgeries: Procedure(s): ARTHROPLASTY, KNEE, TOTAL on 05/19/2024   Consultants:   Discharged Condition: Improved  Hospital Course: Jenna Hogan is an 83 y.o. female who was admitted 05/19/2024 for operative treatment ofPrimary localized osteoarthritis of right knee. Patient has severe unremitting pain that affects sleep, daily activities, and work/hobbies. After pre-op clearance the patient was taken to the operating room on 05/19/2024 and underwent  Procedure(s): ARTHROPLASTY, KNEE, TOTAL.    Patient was given perioperative antibiotics:  Anti-infectives (From admission, onward)    Start     Dose/Rate Route Frequency Ordered Stop   05/19/24 1600  ceFAZolin (ANCEF) IVPB 2g/100 mL premix        2 g 200 mL/hr over 30 Minutes Intravenous Every 6 hours 05/19/24 1500 05/19/24 2225   05/19/24 0715  ceFAZolin (ANCEF) IVPB 2g/100 mL premix        2 g 200 mL/hr over 30 Minutes Intravenous On call to O.R. 05/19/24 0706 05/19/24 1016        Patient was given sequential compression devices, early ambulation, and chemoprophylaxis to prevent DVT.  Patient benefited maximally from hospital stay and there were no complications.    Recent vital signs: Patient Vitals for the past 24 hrs:  BP Temp Temp src Pulse Resp SpO2  05/20/24 0636 (!) 140/88 98.2 F (36.8 C) Oral 73 17 100 %  05/20/24 0137 121/83 98.2 F (36.8 C) Oral 76 16 98 %  05/19/24 2049  (!) 154/75 98 F (36.7 C) Oral 92 16 100 %  05/19/24 1827 137/83 98 F (36.7 C) Oral (!) 102 15 100 %  05/19/24 1505 133/77 97.7 F (36.5 C) Oral 87 18 94 %  05/19/24 1445 131/81 -- -- 81 17 94 %  05/19/24 1430 119/75 -- -- (!) 44 20 94 %  05/19/24 1415 123/63 -- -- 77 20 95 %  05/19/24 1400 125/72 (!) 97.5 F (36.4 C) -- 80 18 96 %  05/19/24 1345 133/66 -- -- 82 17 99 %  05/19/24 1330 119/73 -- -- 73 20 97 %  05/19/24 1315 (!) 131/91 -- -- 68 16 97 %  05/19/24 1300 (!) 139/119 (!) 97.3 F (36.3 C) -- 71 15 94 %  05/19/24 1245 125/63 -- -- (!) 57 18 93 %  05/19/24 1230 112/68 -- -- 60 13 95 %  05/19/24 1215 93/62 -- -- 63 (!) 21 96 %  05/19/24 1200 104/71 -- -- 80 12 100 %  05/19/24 1156 109/64 (!) 96.6 F (35.9 C) -- 77 (!) 21 100 %  05/19/24 0915 (!) 141/94 -- -- 63 18 98 %  05/19/24 0900 (!) 155/91 -- -- 65 14 99 %  05/19/24 0855 (!) 156/87 -- -- 60 15 100 %  05/19/24 0850 (!) 164/89 -- -- 75 14 96 %  05/19/24 0845 (!) 165/66 -- -- 69 20 93 %     Recent laboratory studies: No results for input(s): "  WBC", "HGB", "HCT", "PLT", "NA", "K", "CL", "CO2", "BUN", "CREATININE", "GLUCOSE", "INR", "CALCIUM" in the last 72 hours.  Invalid input(s): "PT", "2"   Discharge Medications:   Allergies as of 05/20/2024   No Known Allergies      Medication List     TAKE these medications    apixaban  5 MG Tabs tablet Commonly known as: ELIQUIS  Take 1 tablet (5 mg total) by mouth 2 (two) times daily. For stroke prevention   CALCIUM 600 + D PO Take 2 tablets by mouth daily.   cetirizine 10 MG tablet Commonly known as: ZYRTEC Take 10 mg by mouth daily.   metoprolol  tartrate 50 MG tablet Commonly known as: LOPRESSOR  TAKE 1 TABLET (50 MG TOTAL) BY MOUTH 2 (TWO) TIMES DAILY. FOR BLOOD PRESSURE.   montelukast  10 MG tablet Commonly known as: SINGULAIR  TAKE 1 TABLET (10 MG TOTAL) BY MOUTH AT BEDTIME. FOR ALLERGIES   PRESERVISION AREDS 2 PO Take 2 capsules by mouth daily.    Womens 50+ Multi Vitamin Tabs Take 1 tablet by mouth daily.   PROBIOTIC PO Take 1 capsule by mouth daily.   sertraline  50 MG tablet Commonly known as: ZOLOFT  TAKE 1 TABLET BY MOUTH EVERY DAY FOR ANXIETY   simvastatin  40 MG tablet Commonly known as: ZOCOR  TAKE 1 TABLET BY MOUTH EVERY EVENING FOR CHOLESTEROL   Vitamin D 50 MCG (2000 UT) tablet Take 4,000 Units by mouth daily.               Durable Medical Equipment  (From admission, onward)           Start     Ordered   05/19/24 1501  DME Walker rolling  Once       Question:  Patient needs a walker to treat with the following condition  Answer:  Primary osteoarthritis of right knee   05/19/24 1500   05/19/24 1501  DME 3 n 1  Once        05/19/24 1500   05/19/24 1501  DME Bedside commode  Once       Question:  Patient needs a bedside commode to treat with the following condition  Answer:  Primary osteoarthritis of right knee   05/19/24 1500            Diagnostic Studies: MYOCARDIAL PERFUSION IMAGING Result Date: 04/21/2024   Baseline ECG is abnormal. ECG rhythm shows atrial fibrillation.   A pharmacological stress test was performed using IV Lexiscan  0.4mg  over 10 seconds performed without concurrent submaximal exercise. The patient reported dyspnea and headaches during the stress test. Normal blood pressure and normal heart rate response noted during stress. Heart rate recovery was normal.   No ST deviation was noted. ECG was interpretable and without significant changes. The ECG was not diagnostic due to pharmacologic protocol.   LV perfusion is abnormal. There is no evidence of ischemia. There is no evidence of infarction. Defect 1: There is a small defect with mild reduction in uptake present in the apical apex location(s) that is fixed. Consistent with artifact caused by bowel tracer uptake and diaphragmatic attenuation.   Left ventricular function is abnormal. Global function is mildly reduced. Nuclear stress EF:  43%. The left ventricular ejection fraction is moderately decreased (30-44%). End diastolic cavity size is normal. End systolic cavity size is normal. No evidence of transient ischemic dilation (TID) noted.   Findings are consistent with no ischemia. The study is intermediate risk due to LV dysfunction.    Disposition: Discharge  disposition: 01-Home or Self Care       Discharge Instructions     Call MD / Call 911   Complete by: As directed    If you experience chest pain or shortness of breath, CALL 911 and be transported to the hospital emergency room.  If you develope a fever above 101 F, pus (white drainage) or increased drainage or redness at the wound, or calf pain, call your surgeon's office.   Constipation Prevention   Complete by: As directed    Drink plenty of fluids.  Prune juice may be helpful.  You may use a stool softener, such as Colace (over the counter) 100 mg twice a day.  Use MiraLax (over the counter) for constipation as needed.   Diet - low sodium heart healthy   Complete by: As directed    Discharge instructions   Complete by: As directed    INSTRUCTIONS AFTER JOINT REPLACEMENT   Remove items at home which could result in a fall. This includes throw rugs or furniture in walking pathways ICE to the affected joint every three hours while awake for 30 minutes at a time, for at least the first 3-5 days, and then as needed for pain and swelling.  Continue to use ice for pain and swelling. You may notice swelling that will progress down to the foot and ankle.  This is normal after surgery.  Elevate your leg when you are not up walking on it.   Continue to use the breathing machine you got in the hospital (incentive spirometer) which will help keep your temperature down.  It is common for your temperature to cycle up and down following surgery, especially at night when you are not up moving around and exerting yourself.  The breathing machine keeps your lungs expanded and your  temperature down.   DIET:  As you were doing prior to hospitalization, we recommend a well-balanced diet.  DRESSING / WOUND CARE / SHOWERING  You may shower 3 days after surgery, but keep the wounds dry during showering.  You may use an occlusive plastic wrap (Press'n Seal for example), NO SOAKING/SUBMERGING IN THE BATHTUB.  If the bandage gets wet, change with a clean dry gauze.  If the incision gets wet, pat the wound dry with a clean towel.  ACTIVITY  Increase activity slowly as tolerated, but follow the weight bearing instructions below.   No driving for 6 weeks or until further direction given by your physician.  You cannot drive while taking narcotics.  No lifting or carrying greater than 10 lbs. until further directed by your surgeon. Avoid periods of inactivity such as sitting longer than an hour when not asleep. This helps prevent blood clots.  You may return to work once you are authorized by your doctor.     WEIGHT BEARING   Weight bearing as tolerated with assist device (walker, cane, etc) as directed, use it as long as suggested by your surgeon or therapist, typically at least 4-6 weeks.   EXERCISES  Results after joint replacement surgery are often greatly improved when you follow the exercise, range of motion and muscle strengthening exercises prescribed by your doctor. Safety measures are also important to protect the joint from further injury. Any time any of these exercises cause you to have increased pain or swelling, decrease what you are doing until you are comfortable again and then slowly increase them. If you have problems or questions, call your caregiver or physical therapist for advice.  Rehabilitation is important following a joint replacement. After just a few days of immobilization, the muscles of the leg can become weakened and shrink (atrophy).  These exercises are designed to build up the tone and strength of the thigh and leg muscles and to improve  motion. Often times heat used for twenty to thirty minutes before working out will loosen up your tissues and help with improving the range of motion but do not use heat for the first two weeks following surgery (sometimes heat can increase post-operative swelling).   These exercises can be done on a training (exercise) mat, on the floor, on a table or on a bed. Use whatever works the best and is most comfortable for you.    Use music or television while you are exercising so that the exercises are a pleasant break in your day. This will make your life better with the exercises acting as a break in your routine that you can look forward to.   Perform all exercises about fifteen times, three times per day or as directed.  You should exercise both the operative leg and the other leg as well.  Exercises include:   Quad Sets - Tighten up the muscle on the front of the thigh (Quad) and hold for 5-10 seconds.   Straight Leg Raises - With your knee straight (if you were given a brace, keep it on), lift the leg to 60 degrees, hold for 3 seconds, and slowly lower the leg.  Perform this exercise against resistance later as your leg gets stronger.  Leg Slides: Lying on your back, slowly slide your foot toward your buttocks, bending your knee up off the floor (only go as far as is comfortable). Then slowly slide your foot back down until your leg is flat on the floor again.  Angel Wings: Lying on your back spread your legs to the side as far apart as you can without causing discomfort.  Hamstring Strength:  Lying on your back, push your heel against the floor with your leg straight by tightening up the muscles of your buttocks.  Repeat, but this time bend your knee to a comfortable angle, and push your heel against the floor.  You may put a pillow under the heel to make it more comfortable if necessary.   A rehabilitation program following joint replacement surgery can speed recovery and prevent re-injury in the  future due to weakened muscles. Contact your doctor or a physical therapist for more information on knee rehabilitation.    CONSTIPATION  Constipation is defined medically as fewer than three stools per week and severe constipation as less than one stool per week.  Even if you have a regular bowel pattern at home, your normal regimen is likely to be disrupted due to multiple reasons following surgery.  Combination of anesthesia, postoperative narcotics, change in appetite and fluid intake all can affect your bowels.   YOU MUST use at least one of the following options; they are listed in order of increasing strength to get the job done.  They are all available over the counter, and you may need to use some, POSSIBLY even all of these options:    Drink plenty of fluids (prune juice may be helpful) and high fiber foods Colace 100 mg by mouth twice a day  Senokot for constipation as directed and as needed Dulcolax (bisacodyl), take with full glass of water  Miralax (polyethylene glycol) once or twice a day as needed.  If you have  tried all these things and are unable to have a bowel movement in the first 3-4 days after surgery call either your surgeon or your primary doctor.    If you experience loose stools or diarrhea, hold the medications until you stool forms back up.  If your symptoms do not get better within 1 week or if they get worse, check with your doctor.  If you experience "the worst abdominal pain ever" or develop nausea or vomiting, please contact the office immediately for further recommendations for treatment.   ITCHING:  If you experience itching with your medications, try taking only a single pain pill, or even half a pain pill at a time.  You can also use Benadryl over the counter for itching or also to help with sleep.   TED HOSE STOCKINGS:  Use stockings on both legs until for at least 2 weeks or as directed by physician office. They may be removed at night for  sleeping.  MEDICATIONS:  See your medication summary on the "After Visit Summary" that nursing will review with you.  You may have some home medications which will be placed on hold until you complete the course of blood thinner medication.  It is important for you to complete the blood thinner medication as prescribed.  PRECAUTIONS:  If you experience chest pain or shortness of breath - call 911 immediately for transfer to the hospital emergency department.   If you develop a fever greater that 101 F, purulent drainage from wound, increased redness or drainage from wound, foul odor from the wound/dressing, or calf pain - CONTACT YOUR SURGEON.                                                   FOLLOW-UP APPOINTMENTS:  If you do not already have a post-op appointment, please call the office for an appointment to be seen by your surgeon.  Guidelines for how soon to be seen are listed in your "After Visit Summary", but are typically between 1-4 weeks after surgery.  OTHER INSTRUCTIONS:   Knee Replacement:  Do not place pillow under knee, focus on keeping the knee straight while resting. CPM instructions: 0-90 degrees, 2 hours in the morning, 2 hours in the afternoon, and 2 hours in the evening. Place foam block, curve side up under heel at all times except when in CPM or when walking.  DO NOT modify, tear, cut, or change the foam block in any way.  POST-OPERATIVE OPIOID TAPER INSTRUCTIONS: It is important to wean off of your opioid medication as soon as possible. If you do not need pain medication after your surgery it is ok to stop day one. Opioids include: Codeine, Hydrocodone(Norco, Vicodin), Oxycodone (Percocet, oxycontin ) and hydromorphone  amongst others.  Long term and even short term use of opiods can cause: Increased pain response Dependence Constipation Depression Respiratory depression And more.  Withdrawal symptoms can include Flu like symptoms Nausea, vomiting And more Techniques  to manage these symptoms Hydrate well Eat regular healthy meals Stay active Use relaxation techniques(deep breathing, meditating, yoga) Do Not substitute Alcohol  to help with tapering If you have been on opioids for less than two weeks and do not have pain than it is ok to stop all together.  Plan to wean off of opioids This plan should start within one week post op of your joint  replacement. Maintain the same interval or time between taking each dose and first decrease the dose.  Cut the total daily intake of opioids by one tablet each day Next start to increase the time between doses. The last dose that should be eliminated is the evening dose.     MAKE SURE YOU:  Understand these instructions.  Get help right away if you are not doing well or get worse.    Thank you for letting us  be a part of your medical care team.  It is a privilege we respect greatly.  We hope these instructions will help you stay on track for a fast and full recovery!   Increase activity slowly as tolerated   Complete by: As directed    Post-operative opioid taper instructions:   Complete by: As directed    POST-OPERATIVE OPIOID TAPER INSTRUCTIONS: It is important to wean off of your opioid medication as soon as possible. If you do not need pain medication after your surgery it is ok to stop day one. Opioids include: Codeine, Hydrocodone(Norco, Vicodin), Oxycodone (Percocet, oxycontin ) and hydromorphone  amongst others.  Long term and even short term use of opiods can cause: Increased pain response Dependence Constipation Depression Respiratory depression And more.  Withdrawal symptoms can include Flu like symptoms Nausea, vomiting And more Techniques to manage these symptoms Hydrate well Eat regular healthy meals Stay active Use relaxation techniques(deep breathing, meditating, yoga) Do Not substitute Alcohol  to help with tapering If you have been on opioids for less than two weeks and do not  have pain than it is ok to stop all together.  Plan to wean off of opioids This plan should start within one week post op of your joint replacement. Maintain the same interval or time between taking each dose and first decrease the dose.  Cut the total daily intake of opioids by one tablet each day Next start to increase the time between doses. The last dose that should be eliminated is the evening dose.           Follow-up Information     Dayne Even, MD. Schedule an appointment as soon as possible for a visit in 2 week(s).   Specialty: Orthopedic Surgery Contact information: [address] [place] [place] [address] [phone]                  Signed: Geraldina Klinefelter Shaneese Tait 05/20/2024, 8:16 AM

## 2024-05-20 NOTE — Care Plan (Signed)
 Ortho Bundle Case Management Note  Patient Details  Name: Jenna Hogan MRN: [id number] Date of Birth: 07/24/1941 Met with patient in the office prior to surgery. Will discharge to home with son to assist. Rolling walker ordered. HHPT set up with [facility] and OPPT set up [facility]. Patient and MD in agreement with plan. Choice offered                     DME Arranged:  Walker rolling DME Agency:  [facility]  HH Arranged:  PT HH Agency:  [facility]  Additional Comments: Please contact me with any questions of if this plan should need to change.  Cornelia Dieter,  RN,BSN,MHA,CCM  [facility]  [phone] 05/20/2024, 12:06 PM

## 2024-05-20 NOTE — Care Management Obs Status (Signed)
 MEDICARE OBSERVATION STATUS NOTIFICATION   Patient Details  Name: Jenna Hogan MRN: [id number] Date of Birth: Oct 03, 1941   Medicare Observation Status Notification Given:  Yes    Delilah Fend, LCSW 05/20/2024, 1:48 PM

## 2024-05-20 NOTE — Progress Notes (Signed)
 Physical Therapy Treatment Patient Details Name: Jenna Hogan MRN: [id number] DOB: 1941/11/07 Today's Date: 05/20/2024   History of Present Illness 83 yo female presents to therapy s/p R TKA on 05/19/2024 due to failure of conservative measures. Pt PMH includes but is not limited to: OA, A-fib, L hip  and LE pain, CAD, chronic LBP, HLD, HTN, and carpal tunnel release.    PT Comments  Assisted to pt walk to the bathroom, then to recliner. She reported she was in too much pain to attempt stairs at present. Pt had toradol  prior to this session and is now requesting stronger pain medication, RN notified. Will attempt stairs after she receives pain medication.    If plan is discharge home, recommend the following: A little help with walking and/or transfers;A little help with bathing/dressing/bathroom;Assistance with cooking/housework;Assist for transportation;Help with stairs or ramp for entrance   Can travel by private vehicle        Equipment Recommendations  Rolling walker (2 wheels)    Recommendations for Other Services       Precautions / Restrictions Precautions Precautions: Knee;Fall Precaution Booklet Issued: Yes (comment) Recall of Precautions/Restrictions: Intact Precaution/Restrictions Comments: reviewed no pillow under knee Restrictions Weight Bearing Restrictions Per Provider Order: No RLE Weight Bearing Per Provider Order: Weight bearing as tolerated     Mobility  Bed Mobility Overal bed mobility: Needs Assistance Bed Mobility: Supine to Sit     Supine to sit: Supervision     General bed mobility comments: up in recliner    Transfers Overall transfer level: Needs assistance Equipment used: Rolling walker (2 wheels) Transfers: Sit to/from Stand Sit to Stand: Min assist           General transfer comment: cues for hand placement, min A to power up x 2 trials    Ambulation/Gait Ambulation/Gait assistance: Contact guard assist Gait Distance (Feet):  15 Feet Assistive device: Rolling walker (2 wheels) Gait Pattern/deviations: Step-to pattern, Decreased stance time - right, Antalgic, Trunk flexed Gait velocity: decreased     General Gait Details: VCs for sequencing and positioning in RW, no loss of balance, 15' + 8' with seated rest on toilet. Distance limited by pain and fatigue.   Stairs             Wheelchair Mobility     Tilt Bed    Modified Rankin (Stroke Patients Only)       Balance Overall balance assessment: Needs assistance Sitting-balance support: Feet supported Sitting balance-Leahy Scale: Good     Standing balance support: Bilateral upper extremity supported, Reliant on assistive device for balance, During functional activity Standing balance-Leahy Scale: Poor                              Communication Communication Communication: No apparent difficulties  Cognition Arousal: Alert Behavior During Therapy: WFL for tasks assessed/performed   PT - Cognitive impairments: No apparent impairments                         Following commands: Intact      Cueing    Exercises     General Comments        Pertinent Vitals/Pain Pain Assessment Pain Score: 7  Pain Location: R knee Pain Descriptors / Indicators: Aching, Constant, Discomfort, Dull, Guarding, Grimacing Pain Intervention(s): Limited activity within patient's tolerance, Monitored during session, Premedicated before session, Patient requesting pain meds-RN notified, Repositioned  Home Living                          Prior Function            PT Goals (current goals can now be found in the care plan section) Acute Rehab PT Goals Patient Stated Goal: to get back to doing everything PT Goal Formulation: With patient Time For Goal Achievement: 06/02/24 Potential to Achieve Goals: Good Progress towards PT goals: Progressing toward goals    Frequency    7X/week      PT Plan       Co-evaluation              AM-PAC PT "6 Clicks" Mobility   Outcome Measure  Help needed turning from your back to your side while in a flat bed without using bedrails?: A Little Help needed moving from lying on your back to sitting on the side of a flat bed without using bedrails?: A Little Help needed moving to and from a bed to a chair (including a wheelchair)?: A Little Help needed standing up from a chair using your arms (e.g., wheelchair or bedside chair)?: A Little Help needed to walk in hospital room?: A Little Help needed climbing 3-5 steps with a railing? : A Lot 6 Click Score: 17    End of Session Equipment Utilized During Treatment: Gait belt Activity Tolerance: Patient limited by pain Patient left: in chair;with call bell/phone within reach;with family/visitor present Nurse Communication: Mobility status PT Visit Diagnosis: Unsteadiness on feet (R26.81);Other abnormalities of gait and mobility (R26.89);Muscle weakness (generalized) (M62.81);Difficulty in walking, not elsewhere classified (R26.2);Pain Pain - Right/Left: Right Pain - part of body: Leg;Knee     Time: 1205-1222 PT Time Calculation (min) (ACUTE ONLY): 17 min  Charges:    $Gait Training: 8-22 mins  PT General Charges $$ ACUTE PT VISIT: 1 Visit                     Daymon Evans PT 05/20/2024  Acute Rehabilitation Services  Office [phone]

## 2024-05-21 ENCOUNTER — Ambulatory Visit: Admitting: Cardiology

## 2024-05-21 ENCOUNTER — Telehealth: Payer: Self-pay

## 2024-05-21 DIAGNOSIS — I10 Essential (primary) hypertension: Secondary | ICD-10-CM | POA: Diagnosis not present

## 2024-05-21 DIAGNOSIS — R32 Unspecified urinary incontinence: Secondary | ICD-10-CM | POA: Diagnosis not present

## 2024-05-21 DIAGNOSIS — M545 Low back pain, unspecified: Secondary | ICD-10-CM | POA: Diagnosis not present

## 2024-05-21 DIAGNOSIS — F32A Depression, unspecified: Secondary | ICD-10-CM | POA: Diagnosis not present

## 2024-05-21 DIAGNOSIS — Z471 Aftercare following joint replacement surgery: Secondary | ICD-10-CM | POA: Diagnosis not present

## 2024-05-21 DIAGNOSIS — M1712 Unilateral primary osteoarthritis, left knee: Secondary | ICD-10-CM | POA: Diagnosis not present

## 2024-05-21 DIAGNOSIS — E7800 Pure hypercholesterolemia, unspecified: Secondary | ICD-10-CM | POA: Diagnosis not present

## 2024-05-21 DIAGNOSIS — I2510 Atherosclerotic heart disease of native coronary artery without angina pectoris: Secondary | ICD-10-CM | POA: Diagnosis not present

## 2024-05-21 DIAGNOSIS — E669 Obesity, unspecified: Secondary | ICD-10-CM | POA: Diagnosis not present

## 2024-05-21 DIAGNOSIS — Z79891 Long term (current) use of opiate analgesic: Secondary | ICD-10-CM | POA: Diagnosis not present

## 2024-05-21 DIAGNOSIS — R7303 Prediabetes: Secondary | ICD-10-CM | POA: Diagnosis not present

## 2024-05-21 DIAGNOSIS — G8929 Other chronic pain: Secondary | ICD-10-CM | POA: Diagnosis not present

## 2024-05-21 DIAGNOSIS — Z96651 Presence of right artificial knee joint: Secondary | ICD-10-CM | POA: Diagnosis not present

## 2024-05-21 DIAGNOSIS — Z6827 Body mass index (BMI) 27.0-27.9, adult: Secondary | ICD-10-CM | POA: Diagnosis not present

## 2024-05-21 DIAGNOSIS — F411 Generalized anxiety disorder: Secondary | ICD-10-CM

## 2024-05-21 DIAGNOSIS — Z7901 Long term (current) use of anticoagulants: Secondary | ICD-10-CM | POA: Diagnosis not present

## 2024-05-21 DIAGNOSIS — I4891 Unspecified atrial fibrillation: Secondary | ICD-10-CM | POA: Diagnosis not present

## 2024-05-21 NOTE — Telephone Encounter (Signed)
 Copied from CRM [id number]. Topic: Clinical - Medication Question >> May 21, 2024  1:57 PM Dewanda Foots wrote: Reason for CRM: Daughter in law, Genevia Kern, states that she is home from surgery and is having PT done in the home. This is causing her some anxiety and she is wanting to know if she can have something for her anxiety. Previous medication Eliquis  states that it interferes with her AFIB and would probably need something different to help with her anxiety.   If so, this is her preferred pharmacy:  [id number] [place], [place] - [address] [place] [address] Phone: [phone] Fax: [phone] Hours: Not open 24 hours  [phone]

## 2024-05-21 NOTE — Telephone Encounter (Signed)
 Noted.  We can switch her from sertraline  to BuSpar for anxiety.  The BuSpar is taken twice daily every day.  Let me know when you have spoken with her and I will send a prescription to the pharmacy.

## 2024-05-22 ENCOUNTER — Encounter (HOSPITAL_COMMUNITY): Payer: Self-pay | Admitting: Orthopaedic Surgery

## 2024-05-22 MED ORDER — BUSPIRONE HCL 5 MG PO TABS: 5.0000 mg | ORAL_TABLET | Freq: Two times a day (BID) | ORAL | 0 refills | Status: DC

## 2024-05-22 NOTE — Addendum Note (Signed)
 Addended by: Charlene Cowdrey K on: 05/22/2024 07:44 AM   Modules accepted: Orders

## 2024-05-22 NOTE — Telephone Encounter (Signed)
 Noted, Rx for buspirone sent to pharmacy.

## 2024-05-22 NOTE — Telephone Encounter (Signed)
 Copied from CRM [id number]. Topic: Clinical - Medication Question >> May 21, 2024  5:00 PM Abigail D wrote: Reason for CRM: Patients daughter returning call regarding the status of the medication for anxiety, patient's daughter said that the BuSpar is fine if that's what is recommended per Dana Duncan. She would like it as soon as possible.

## 2024-05-26 ENCOUNTER — Telehealth: Payer: Self-pay | Admitting: Primary Care

## 2024-05-26 DIAGNOSIS — F411 Generalized anxiety disorder: Secondary | ICD-10-CM

## 2024-05-26 MED ORDER — SERTRALINE HCL 50 MG PO TABS: 50.0000 mg | ORAL_TABLET | Freq: Every day | ORAL | 1 refills | Status: DC

## 2024-05-26 MED ORDER — HYDROXYZINE HCL 10 MG PO TABS: 10.0000 mg | ORAL_TABLET | Freq: Two times a day (BID) | ORAL | 0 refills | Status: DC | PRN

## 2024-05-26 NOTE — Telephone Encounter (Signed)
 Noted and agree that Zoloft  would be a better choice.  Refill provided to pharmacy.  Prescription for hydroxyzine sent to pharmacy to use as needed for anxiety during therapy.  Stop the BuSpar.

## 2024-05-26 NOTE — Telephone Encounter (Signed)
 Called and spoke with patients daughter in law, states patient is making good progress with recovery from her knee surgery however her anxiety around the therapy has caused some hesitation and daughter in law believes fear that is not allowing her to have as much progress with recovery. Patient has said she doesn't feel like the buspar is doing well for her. They are wanting to know if it is safe for her to go back on Zoloft . Patient took herself off of it because of something she read online that said there was an interaction with her eliquis  and zoloft . Patient has told daughter in law that she liked taking the Zoloft , she just got concerned when she read that it could interact. Patients daughter in law is also requesting a an added short term as needed anxiety medication that she can take on therapy days. Patients daughter in law mentioned low dose of the medication xanax or klonopin as options she is familiar with. Please advise.

## 2024-05-26 NOTE — Telephone Encounter (Signed)
  Called pt daughter in law back but she only wants to talk to Polly Brink or the CMA concerning pt meds   Copied from CRM [id number]. Topic: General - Call Back - No Documentation >> May 26, 2024 11:19 AM Stanly Early wrote: Reason for CRM: Daughter in law Genevia Kern would like a callback today. [phone]

## 2024-05-26 NOTE — Addendum Note (Signed)
 Addended by: Wen Merced K on: 05/26/2024 04:18 PM   Modules accepted: Orders

## 2024-05-27 NOTE — Telephone Encounter (Signed)
Called patients daughter in law  and reviewed all information.She verbalized understanding. Will call if any further questions.

## 2024-05-29 ENCOUNTER — Ambulatory Visit: Admitting: Cardiology

## 2024-05-29 DIAGNOSIS — M25561 Pain in right knee: Secondary | ICD-10-CM | POA: Diagnosis not present

## 2024-05-29 DIAGNOSIS — M25661 Stiffness of right knee, not elsewhere classified: Secondary | ICD-10-CM | POA: Diagnosis not present

## 2024-05-29 DIAGNOSIS — Z96651 Presence of right artificial knee joint: Secondary | ICD-10-CM | POA: Diagnosis not present

## 2024-05-29 DIAGNOSIS — R2689 Other abnormalities of gait and mobility: Secondary | ICD-10-CM | POA: Diagnosis not present

## 2024-06-02 DIAGNOSIS — M25661 Stiffness of right knee, not elsewhere classified: Secondary | ICD-10-CM | POA: Diagnosis not present

## 2024-06-02 DIAGNOSIS — Z96651 Presence of right artificial knee joint: Secondary | ICD-10-CM | POA: Diagnosis not present

## 2024-06-02 DIAGNOSIS — M25561 Pain in right knee: Secondary | ICD-10-CM | POA: Diagnosis not present

## 2024-06-02 DIAGNOSIS — R2689 Other abnormalities of gait and mobility: Secondary | ICD-10-CM | POA: Diagnosis not present

## 2024-06-04 DIAGNOSIS — M25661 Stiffness of right knee, not elsewhere classified: Secondary | ICD-10-CM | POA: Diagnosis not present

## 2024-06-04 DIAGNOSIS — Z96651 Presence of right artificial knee joint: Secondary | ICD-10-CM | POA: Diagnosis not present

## 2024-06-04 DIAGNOSIS — R2689 Other abnormalities of gait and mobility: Secondary | ICD-10-CM | POA: Diagnosis not present

## 2024-06-04 DIAGNOSIS — M25561 Pain in right knee: Secondary | ICD-10-CM | POA: Diagnosis not present

## 2024-06-09 DIAGNOSIS — M25661 Stiffness of right knee, not elsewhere classified: Secondary | ICD-10-CM | POA: Diagnosis not present

## 2024-06-09 DIAGNOSIS — R2689 Other abnormalities of gait and mobility: Secondary | ICD-10-CM | POA: Diagnosis not present

## 2024-06-09 DIAGNOSIS — M25561 Pain in right knee: Secondary | ICD-10-CM | POA: Diagnosis not present

## 2024-06-09 DIAGNOSIS — Z96651 Presence of right artificial knee joint: Secondary | ICD-10-CM | POA: Diagnosis not present

## 2024-06-11 DIAGNOSIS — Z96651 Presence of right artificial knee joint: Secondary | ICD-10-CM | POA: Diagnosis not present

## 2024-06-11 DIAGNOSIS — M25561 Pain in right knee: Secondary | ICD-10-CM | POA: Diagnosis not present

## 2024-06-11 DIAGNOSIS — R2689 Other abnormalities of gait and mobility: Secondary | ICD-10-CM | POA: Diagnosis not present

## 2024-06-11 DIAGNOSIS — M25661 Stiffness of right knee, not elsewhere classified: Secondary | ICD-10-CM | POA: Diagnosis not present

## 2024-06-16 DIAGNOSIS — M25661 Stiffness of right knee, not elsewhere classified: Secondary | ICD-10-CM | POA: Diagnosis not present

## 2024-06-16 DIAGNOSIS — M25561 Pain in right knee: Secondary | ICD-10-CM | POA: Diagnosis not present

## 2024-06-16 DIAGNOSIS — R2689 Other abnormalities of gait and mobility: Secondary | ICD-10-CM | POA: Diagnosis not present

## 2024-06-16 DIAGNOSIS — Z96651 Presence of right artificial knee joint: Secondary | ICD-10-CM | POA: Diagnosis not present

## 2024-06-18 DIAGNOSIS — M25561 Pain in right knee: Secondary | ICD-10-CM | POA: Diagnosis not present

## 2024-06-18 DIAGNOSIS — M25661 Stiffness of right knee, not elsewhere classified: Secondary | ICD-10-CM | POA: Diagnosis not present

## 2024-06-18 DIAGNOSIS — Z96651 Presence of right artificial knee joint: Secondary | ICD-10-CM | POA: Diagnosis not present

## 2024-06-18 DIAGNOSIS — R2689 Other abnormalities of gait and mobility: Secondary | ICD-10-CM | POA: Diagnosis not present

## 2024-06-23 DIAGNOSIS — M25561 Pain in right knee: Secondary | ICD-10-CM | POA: Diagnosis not present

## 2024-06-23 DIAGNOSIS — Z96651 Presence of right artificial knee joint: Secondary | ICD-10-CM | POA: Diagnosis not present

## 2024-06-23 DIAGNOSIS — M25661 Stiffness of right knee, not elsewhere classified: Secondary | ICD-10-CM | POA: Diagnosis not present

## 2024-06-23 DIAGNOSIS — R2689 Other abnormalities of gait and mobility: Secondary | ICD-10-CM | POA: Diagnosis not present

## 2024-06-24 ENCOUNTER — Encounter

## 2024-06-24 ENCOUNTER — Other Ambulatory Visit

## 2024-06-25 DIAGNOSIS — R2689 Other abnormalities of gait and mobility: Secondary | ICD-10-CM | POA: Diagnosis not present

## 2024-06-25 DIAGNOSIS — Z96651 Presence of right artificial knee joint: Secondary | ICD-10-CM | POA: Diagnosis not present

## 2024-06-25 DIAGNOSIS — M25561 Pain in right knee: Secondary | ICD-10-CM | POA: Diagnosis not present

## 2024-06-25 DIAGNOSIS — M25661 Stiffness of right knee, not elsewhere classified: Secondary | ICD-10-CM | POA: Diagnosis not present

## 2024-07-08 DIAGNOSIS — R2689 Other abnormalities of gait and mobility: Secondary | ICD-10-CM | POA: Diagnosis not present

## 2024-07-08 DIAGNOSIS — M25561 Pain in right knee: Secondary | ICD-10-CM | POA: Diagnosis not present

## 2024-07-08 DIAGNOSIS — Z96651 Presence of right artificial knee joint: Secondary | ICD-10-CM | POA: Diagnosis not present

## 2024-07-08 DIAGNOSIS — M25661 Stiffness of right knee, not elsewhere classified: Secondary | ICD-10-CM | POA: Diagnosis not present

## 2024-07-15 DIAGNOSIS — M25561 Pain in right knee: Secondary | ICD-10-CM | POA: Diagnosis not present

## 2024-07-15 DIAGNOSIS — Z96651 Presence of right artificial knee joint: Secondary | ICD-10-CM | POA: Diagnosis not present

## 2024-07-15 DIAGNOSIS — R2689 Other abnormalities of gait and mobility: Secondary | ICD-10-CM | POA: Diagnosis not present

## 2024-07-15 DIAGNOSIS — M25661 Stiffness of right knee, not elsewhere classified: Secondary | ICD-10-CM | POA: Diagnosis not present

## 2024-07-22 DIAGNOSIS — M25661 Stiffness of right knee, not elsewhere classified: Secondary | ICD-10-CM | POA: Diagnosis not present

## 2024-07-22 DIAGNOSIS — Z96651 Presence of right artificial knee joint: Secondary | ICD-10-CM | POA: Diagnosis not present

## 2024-07-22 DIAGNOSIS — R2689 Other abnormalities of gait and mobility: Secondary | ICD-10-CM | POA: Diagnosis not present

## 2024-07-22 DIAGNOSIS — M25561 Pain in right knee: Secondary | ICD-10-CM | POA: Diagnosis not present

## 2024-07-29 DIAGNOSIS — R2689 Other abnormalities of gait and mobility: Secondary | ICD-10-CM | POA: Diagnosis not present

## 2024-07-29 DIAGNOSIS — M25561 Pain in right knee: Secondary | ICD-10-CM | POA: Diagnosis not present

## 2024-07-29 DIAGNOSIS — M25661 Stiffness of right knee, not elsewhere classified: Secondary | ICD-10-CM | POA: Diagnosis not present

## 2024-07-29 DIAGNOSIS — Z96651 Presence of right artificial knee joint: Secondary | ICD-10-CM | POA: Diagnosis not present

## 2024-08-03 ENCOUNTER — Other Ambulatory Visit: Payer: Self-pay | Admitting: Primary Care

## 2024-08-03 DIAGNOSIS — I4891 Unspecified atrial fibrillation: Secondary | ICD-10-CM

## 2024-08-03 NOTE — Telephone Encounter (Signed)
 Please call patient:  Let her know that I reduced the dose of her Eliquis  blood thinner medication to 2.5 mg. She doesn't need the higher dose.

## 2024-08-04 NOTE — Telephone Encounter (Signed)
 Called patient and reviewed all information. Patient verbalized understanding. Will call if any further questions.

## 2024-08-12 ENCOUNTER — Ambulatory Visit
Admission: RE | Admit: 2024-08-12 | Discharge: 2024-08-12 | Disposition: A | Discharge status: Home / Self Care | Source: Ambulatory Visit | Attending: Primary Care | Admitting: Primary Care

## 2024-08-12 ENCOUNTER — Other Ambulatory Visit: Payer: Self-pay | Admitting: Primary Care

## 2024-08-12 DIAGNOSIS — Z1231 Encounter for screening mammogram for malignant neoplasm of breast: Secondary | ICD-10-CM | POA: Insufficient documentation

## 2024-08-12 DIAGNOSIS — E2839 Other primary ovarian failure: Secondary | ICD-10-CM | POA: Insufficient documentation

## 2024-08-12 DIAGNOSIS — Z780 Asymptomatic menopausal state: Secondary | ICD-10-CM | POA: Diagnosis not present

## 2024-08-12 DIAGNOSIS — F411 Generalized anxiety disorder: Secondary | ICD-10-CM

## 2024-08-12 DIAGNOSIS — M810 Age-related osteoporosis without current pathological fracture: Secondary | ICD-10-CM | POA: Diagnosis not present

## 2024-08-14 ENCOUNTER — Ambulatory Visit: Payer: Self-pay | Admitting: Primary Care

## 2024-08-17 DIAGNOSIS — M170 Bilateral primary osteoarthritis of knee: Secondary | ICD-10-CM | POA: Diagnosis not present

## 2024-08-17 DIAGNOSIS — M25561 Pain in right knee: Secondary | ICD-10-CM | POA: Diagnosis not present

## 2024-08-18 NOTE — Progress Notes (Unsigned)
 Cardiology Office Note:  .   Date:  08/21/2024  ID:  Jenna Hogan, DOB [birth year], MRN [id number] PCP: Gretta Comer POUR, NP  [facility] Providers Cardiologist:  Darryle ONEIDA Decent, MD  History of Present Illness: .    Chief Complaint  Patient presents with   Follow-up    Jenna Hogan is a 83 y.o. female with history of  afib, HLD, tricuspid regurgitation who presents for follow-up. Seen earlier this year for Afib. Had knee surgery and did not delay care. Follow-up today.    History of Present Illness   Jenna Hogan is an 83 year old female with persistent atrial fibrillation who presents for follow-up after knee surgery.  She has a history of persistent atrial fibrillation and was rate controlled prior to her knee surgery. Her current heart rate is 71. She is on Eliquis  2.5 mg BID. No chest pain is present, but she occasionally feels her atrial fibrillation. No trouble breathing is noted.  In April, she underwent a stress test, which was negative, and an echocardiogram showed an ejection fraction of 55%. She is also on metoprolol  tartrate 50 mg BID for heart rate control. She is able to perform daily activities such as vacuuming and walking to get the mail, although she experiences some back pain, for which she receives injections. She plans to have her other knee operated on in October or November.  She has a history of hyperlipidemia and is on pravastatin, with her most recent LDL being 66. No issues with cholesterol management are reported. She also has tricuspid regurgitation but reports no symptoms related to this condition.  Socially, she is independent, able to drive, and manages her daily activities well. She reports some swelling in her leg post-surgery, which she manages by elevating her legs at night.           Problem List Persistent atrial fibrillation  -Dx 02/12/2024 2. Moderate to severe TR 3. Pulmonary hypertension  4. HLD -T  chol 170, TG 76, HDL 89, LDL 66     ROS: All other ROS reviewed and negative. Pertinent positives noted in the HPI.     Studies Reviewed: SABRA   EKG Interpretation Date/Time:  Friday August 21 2024 08:48:05 EDT Ventricular Rate:  71 PR Interval:    QRS Duration:  92 QT Interval:  374 QTC Calculation: 406 R Axis:   -4  Text Interpretation: Atrial fibrillation Confirmed by Decent Darryle [phone] on 08/21/2024 9:21:04 AM   NM Stress 04/21/2024 Findings are consistent with no ischemia. The study is intermediate risk due to LV dysfunction.   TTE 03/12/2024  1. Left ventricular ejection fraction, by estimation, is 55%. The left  ventricle has severely decreased function. The left ventricle has no  regional wall motion abnormalities. Left ventricular diastolic function  could not be evaluated.   2. Right ventricular systolic function is normal. The right ventricular  size is normal. There is moderately elevated pulmonary artery systolic  pressure. The estimated right ventricular systolic pressure is 45.9 mmHg.   3. Left atrial size was moderately dilated.   4. Right atrial size was moderately dilated.   5. The mitral valve is normal in structure. Mild mitral valve  regurgitation. No evidence of mitral stenosis.   6. Tricuspid valve regurgitation is severe.   7. The aortic valve is tricuspid. There is mild calcification of the  aortic valve. Aortic valve regurgitation is trivial. No aortic stenosis is  present.   8. The inferior vena  cava is dilated in size with <50% respiratory  variability, suggesting right atrial pressure of 15 mmHg.  Physical Exam:   VS:  BP (!) 148/76 (BP Location: Right Arm, Patient Position: Sitting)   Pulse (!) 55   Ht 5' 2 (1.575 m)   Wt 151 lb 3.2 oz (68.6 kg)   SpO2 98%   BMI 27.65 kg/m    Wt Readings from Last 3 Encounters:  08/21/24 151 lb 3.2 oz (68.6 kg)  05/19/24 149 lb (67.6 kg)  05/15/24 149 lb (67.6 kg)    GEN: Well nourished, well developed in  no acute distress NECK: No JVD; No carotid bruits CARDIAC: irregular rhythm, no murmurs, rubs, gallops RESPIRATORY:  Clear to auscultation without rales, wheezing or rhonchi  ABDOMEN: Soft, non-tender, non-distended EXTREMITIES:  No edema; No deformity  ASSESSMENT AND PLAN: .   Assessment and Plan    Atrial fibrillation, rate controlled Rate controlled AFib with heart rate of 71 bpm. Current Eliquis  dose inappropriate for age and weight. Discussed stroke risk and anticoagulation importance. Tolerates blood thinner well. Recommended continuing anticoagulation if tolerated. - Prescribe Eliquis  5 mg BID. Appropriate dose based on kidney function and weight. Age alone does not qualify for lower dose.  - Continue metoprolol  tartrate 50 mg BID. - Instruct to stop Eliquis  2 days before surgery and restart as advised post-surgery. - Instruct to stop Eliquis  if receiving spinal injections and inform the provider of anticoagulation status.  Tricuspid regurgitation Asymptomatic tricuspid regurgitation not severe enough for intervention. - Monitor tricuspid regurgitation conservatively.  Hyperlipidemia Hyperlipidemia well-controlled with pravastatin. LDL at 66. - Continue current hyperlipidemia management with pravastatin.   Preop Assessment - Can complete >4 METS. Echo normal function and negative stress. May proceed to surgery. Understands to hold eliquis  before surgery.              Follow-up: Return in about 1 year (around 08/21/2025).   Signed, Darryle DASEN. Barbaraann, MD, [facility]  [facility]  [address] [place], [place] [address] [phone]  9:53 AM

## 2024-08-20 ENCOUNTER — Telehealth: Payer: Self-pay | Admitting: Cardiovascular Disease

## 2024-08-20 NOTE — Telephone Encounter (Signed)
     Pre-operative Risk Assessment    Patient Name: Jenna Hogan  DOB: 07-08-1941 MRN: [id number]   Date of last office visit: 04/16/24 Date of next office visit: 08/21/24   Request for Surgical Clearance    Procedure:  left total knee replacement   Date of Surgery:  Clearance TBD                                Surgeon:  Dr. Dalldorf  Surgeon's Group or Practice Name:  [facility] Phone number:  [phone] Fax number:  [phone]   Type of Clearance Requested:   - Medical  - Pharmacy:  Hold Apixaban  (Eliquis ) defer cards    Type of Anesthesia:  Spinal   Additional requests/questions:    Signed, Deeanna GORMAN Frees   08/20/2024, 1:55 PM

## 2024-08-20 NOTE — Telephone Encounter (Signed)
 Patient has an upcoming appointment clearance can be addressed at ov and appointment note has been added to appointment line that preop clearance is needed

## 2024-08-20 NOTE — Telephone Encounter (Signed)
 Primary Cardiologist:[place] ONEIDA Decent, MD  Chart reviewed as part of pre-operative protocol coverage. Because of Tametha Busey's past medical history and time since last visit, he/she will require a follow-up visit in order to better assess preoperative cardiovascular risk.  Pre-op covering staff: - Patient has appointment with Dr. Decent on 08/21/24 at which time clearance will be addressed. Appointment notes have been updated.  - Please contact requesting surgeon's office via preferred method (i.e, phone, fax) to inform them of need for appointment prior to surgery.  Per office protocol, if no changes from clearance granted for right knee replacement in May 2025, she may hold Eliquis  for 3 days prior to surgery and should resume as soon as hemodynamically stable in the postoperative period.  Rosaline EMERSON Bane, NP-C  08/20/2024, 4:32 PM [address], [address] [place], [place] [address] Office [phone] Fax [phone];

## 2024-08-21 ENCOUNTER — Ambulatory Visit: Attending: Cardiovascular Disease | Admitting: Cardiovascular Disease

## 2024-08-21 ENCOUNTER — Encounter: Payer: Self-pay | Admitting: Cardiovascular Disease

## 2024-08-21 VITALS — BP 148/76 | HR 55 | Ht 62.0 in | Wt 151.2 lb

## 2024-08-21 DIAGNOSIS — E782 Mixed hyperlipidemia: Secondary | ICD-10-CM | POA: Diagnosis not present

## 2024-08-21 DIAGNOSIS — I4891 Unspecified atrial fibrillation: Secondary | ICD-10-CM

## 2024-08-21 DIAGNOSIS — I4819 Other persistent atrial fibrillation: Secondary | ICD-10-CM

## 2024-08-21 DIAGNOSIS — R0602 Shortness of breath: Secondary | ICD-10-CM

## 2024-08-21 DIAGNOSIS — Z01810 Encounter for preprocedural cardiovascular examination: Secondary | ICD-10-CM | POA: Diagnosis not present

## 2024-08-21 DIAGNOSIS — I361 Nonrheumatic tricuspid (valve) insufficiency: Secondary | ICD-10-CM

## 2024-08-21 DIAGNOSIS — I272 Pulmonary hypertension, unspecified: Secondary | ICD-10-CM | POA: Diagnosis not present

## 2024-08-21 MED ORDER — APIXABAN 5 MG PO TABS: 5.0000 mg | ORAL_TABLET | Freq: Two times a day (BID) | ORAL | 3 refills | Status: AC

## 2024-08-21 NOTE — Patient Instructions (Signed)
 Medication Instructions:  INCREASE Eliquis  to 5 mg twice a day *If you need a refill on your cardiac medications before your next appointment, please call your pharmacy*  Lab Work: NONE If you have labs (blood work) drawn today and your tests are completely normal, you will receive your results only by: MyChart Message (if you have MyChart) OR A paper copy in the mail If you have any lab test that is abnormal or we need to change your treatment, we will call you to review the results.  Testing/Procedures: NONE  Follow-Up: At [facility], you and your health needs are our priority.  As part of our continuing mission to provide you with exceptional heart care, our providers are all part of one team.  This team includes your primary Cardiologist (physician) and Advanced Practice Providers or APPs (Physician Assistants and Nurse Practitioners) who all work together to provide you with the care you need, when you need it.  Your next appointment:   1 Year  Provider:   One of our Advanced Practice Providers (APPs): Morse Clause, PA-C  Lamarr Satterfield, NP Miriam Shams, NP  Olivia Pavy, PA-C Josefa Beauvais, NP  Leontine Salen, PA-C Orren Fabry, PA-C  [place], PA-C Ernest Dick, NP  Damien Braver, NP Jon Hails, PA-C  Waddell Donath, PA-C    Dayna Dunn, PA-C  Scott Weaver, PA-C Lum Louis, NP Katlyn West, NP Callie Goodrich, PA-C  Evan Williams, PA-C Sheng Haley, PA-C  Xika Zhao, NP Kathleen Johnson, PA-C    We recommend signing up for the patient portal called MyChart.  Sign up information is provided on this After Visit Summary.  MyChart is used to connect with patients for Virtual Visits (Telemedicine).  Patients are able to view lab/test results, encounter notes, upcoming appointments, etc.  Non-urgent messages can be sent to your provider as well.   To learn more about what you can do with MyChart, go to [email or web address].

## 2024-08-27 DIAGNOSIS — I10 Essential (primary) hypertension: Secondary | ICD-10-CM | POA: Diagnosis not present

## 2024-08-27 DIAGNOSIS — H524 Presbyopia: Secondary | ICD-10-CM | POA: Diagnosis not present

## 2024-08-27 DIAGNOSIS — H40013 Open angle with borderline findings, low risk, bilateral: Secondary | ICD-10-CM | POA: Diagnosis not present

## 2024-08-27 DIAGNOSIS — H35033 Hypertensive retinopathy, bilateral: Secondary | ICD-10-CM | POA: Diagnosis not present

## 2024-08-27 DIAGNOSIS — H26493 Other secondary cataract, bilateral: Secondary | ICD-10-CM | POA: Diagnosis not present

## 2024-08-27 DIAGNOSIS — H353132 Nonexudative age-related macular degeneration, bilateral, intermediate dry stage: Secondary | ICD-10-CM | POA: Diagnosis not present

## 2024-09-02 ENCOUNTER — Telehealth: Payer: Self-pay | Admitting: Cardiovascular Disease

## 2024-09-02 NOTE — Telephone Encounter (Signed)
 Spoke with pt and informed pt that I will be refaxing the preop clearance to the surgeons office.

## 2024-09-02 NOTE — Telephone Encounter (Signed)
 Patient called to follow-up on clearance to have a shot in her back for [facility] Management at [facility], [address], phone# [phone].

## 2024-09-03 NOTE — Telephone Encounter (Signed)
 2nd request received.  Will refax preop clearance to surgeons office.

## 2024-09-07 ENCOUNTER — Telehealth: Payer: Self-pay

## 2024-09-07 NOTE — Telephone Encounter (Signed)
 Pt calling back about receiving spinal injections. She states they received the fax but they need pt to hold for 3 days instead of 2 days, please advise if this can be changed.

## 2024-09-07 NOTE — Telephone Encounter (Signed)
 Dr. Barbaraann,  Jenna Hogan 83 year old female has PMH of atrial fibrillation, tricuspid regurgitation, hyperlipidemia.  She was last seen in clinic by you on 08/21/2024.  During that time she reported that she was able to drive independently and manage her daily activities.  She did note some lower extremity swelling postsurgery.  Follow-up in 1 year was planned.  We received updated request asking for Eliquis  to be held for 3 days prior to spinal injection.  She was previously granted clearance to hold Eliquis  for 2 days prior to spinal injection.  May her Eliquis  be held for 3 days prior to her spinal injection?  Thank you for your help.  Please direct your response to CVD IV preop pool.  [name]. Mehar Sagen NP-C     09/07/2024, 1:20 PM [facility] [address] [place], [place] [address] Office [phone]

## 2024-09-07 NOTE — Telephone Encounter (Signed)
   Pre-operative Risk Assessment    Patient Name: Jenna Hogan  DOB: 03-27-1941 MRN: [id number]   Date of last office visit: 08/21/24 DARRYLE DECENT, MD Date of next office visit: NONE   Request for Surgical Clearance    Procedure:  L3-L4 ESI TO THE LEFT  Date of Surgery:  Clearance TBD                                Surgeon:  DR DEATRICE MANUS Surgeon's Group or Practice Name:  [facility] Phone number:  [phone] Fax number:  [phone]   Type of Clearance Requested:   - Medical  - Pharmacy:  Hold Apixaban  (Eliquis ) 3 DAYS PRIOR AND RESUME DAY AFTER   Type of Anesthesia:  Not Indicated   Additional requests/questions:    Signed, Lucie DELENA Ku   09/07/2024, 4:02 PM

## 2024-09-07 NOTE — Telephone Encounter (Signed)
 I will forward back to preop APP to review the notes about blood thinner hold time.

## 2024-09-08 NOTE — Telephone Encounter (Signed)
     Primary Cardiologist: Darryle ONEIDA Decent, MD  Chart reviewed as part of pre-operative protocol coverage. Given past medical history and time since last visit, based on ACC/AHA guidelines, Jenna Hogan would be at acceptable risk for the planned procedure without further cardiovascular testing.   Her Eliquis  may be held for 3 days prior to spinal injection.  Please resume as soon as hemostasis is achieved and it is safe to do so at the discretion of the surgeon.  I will route this recommendation to the requesting party via Epic fax function and remove from pre-op pool.  Please call with questions.  [name]. Larine Fielding NP-C     09/08/2024, 8:20 AM [facility] [address] [place], [place] [address] Office [phone]

## 2024-09-08 NOTE — Telephone Encounter (Signed)
 All Conversations: Clearance - TBD (Newest Message First)            Me    09/08/24  8:44 AM Note I will re-fax notes to requesting office.      Emelia [name], NP to Cv Div Preop Callback     09/08/24  8:21 AM Please route to requiring/requesting office.  In the note all I have is the phone number here.  Thank you for your help  Emelia [name], NP    09/08/24  8:20 AM Note      Primary Cardiologist: Darryle ONEIDA Decent, MD   Chart reviewed as part of pre-operative protocol coverage. Given past medical history and time since last visit, based on ACC/AHA guidelines, Jenee Spaugh would be at acceptable risk for the planned procedure without further cardiovascular testing.    Her Eliquis  may be held for 3 days prior to spinal injection.  Please resume as soon as hemostasis is achieved and it is safe to do so at the discretion of the surgeon.   I will route this recommendation to the requesting party via Epic fax function and remove from pre-op pool.   Please call with questions.   [name]. Cleaver NP-C       09/08/2024, 8:20 AM [facility] [address] [place], [place] [address] Office [phone]               O'Neal, Darryle Ned, MD to Emelia [name], NP  Cv Div Preop     09/07/24  6:25 PM Sure. OK to hold for 3 days.    Signed, Darryle ONEIDA. Decent, MD, [facility]  [facility]  [address] [place], [place] [address] [phone]  6:25 PM    09/07/24  1:20 PM Emelia [name], NP routed this conversation to Decent Darryle Ned, MD  Emelia [name], NP    09/07/24  1:20 PM Note Dr. Decent,   Ms. Weisner 83 year old female has PMH of atrial fibrillation, tricuspid regurgitation, hyperlipidemia.  She was last seen in clinic by you on 08/21/2024.  During that time she reported that she was able to drive independently and manage her daily activities.  She did note some lower extremity swelling postsurgery.   Follow-up in 1 year was planned.   We received updated request asking for Eliquis  to be held for 3 days prior to spinal injection.  She was previously granted clearance to hold Eliquis  for 2 days prior to spinal injection.   May her Eliquis  be held for 3 days prior to her spinal injection?  Thank you for your help.  Please direct your response to CVD IV preop pool.   [name]. Cleaver NP-C      09/07/2024, 1:20 PM [facility] [address] [place], [place] [address] Office [phone]           09/07/24  1:03 PM You routed this conversation to Cv Div Preop  Me    09/07/24  1:03 PM Note I will forward back to preop APP to review the notes about blood thinner hold time.         09/07/24 10:16 AM Lenora Been R routed this conversation to [employer], Chloe R CM   09/07/24 10:16 AM Note Pt calling back about receiving spinal injections. She states they received the fax but they need pt to hold for 3 days instead of 2  days, please advise if this can be changed.          09/07/24 10:15 AM Therisa, Wanda [place] contacted Lenora Sheffield JONELLE Alvia Lucie A, CMA    09/02/24 11:09 AM Note Spoke with pt and informed pt that I will be refaxing the preop clearance to the surgeons office.        09/02/24 11:08 AM Therisa, Wanda [place] contacted Alvia Lucie LABOR, CMA    09/02/24 10:17 AM Tanda, Herma B routed this conversation to Cv Div Preop Callback  Wilson, Jasmin B JW   09/02/24 10:17 AM Note Patient called to follow-up on clearance to have a shot in her back for [facility] at [facility], [address], phone# [phone].

## 2024-09-08 NOTE — Telephone Encounter (Signed)
I will re-fax notes to requesting office.

## 2024-09-14 DIAGNOSIS — M48062 Spinal stenosis, lumbar region with neurogenic claudication: Secondary | ICD-10-CM | POA: Diagnosis not present

## 2024-09-17 ENCOUNTER — Encounter: Payer: Self-pay | Admitting: Primary Care

## 2024-09-17 ENCOUNTER — Ambulatory Visit (INDEPENDENT_AMBULATORY_CARE_PROVIDER_SITE_OTHER): Admitting: Primary Care

## 2024-09-17 VITALS — BP 146/78 | HR 84 | Temp 97.8°F | Ht 62.0 in | Wt 148.0 lb

## 2024-09-17 DIAGNOSIS — M1712 Unilateral primary osteoarthritis, left knee: Secondary | ICD-10-CM

## 2024-09-17 DIAGNOSIS — Z01818 Encounter for other preprocedural examination: Secondary | ICD-10-CM | POA: Diagnosis not present

## 2024-09-17 NOTE — Patient Instructions (Signed)
 It was a pleasure to see you today!

## 2024-09-17 NOTE — Assessment & Plan Note (Signed)
 Pending left total knee replacement per Dr. Dalldorf.  Exam today stable. Reviewed prior lab work, she will also complete lab work prior to surgery. Do recommend cardiology clearance.  Discussed this today.  Form completed, will fax to orthopedics office.

## 2024-09-17 NOTE — Assessment & Plan Note (Signed)
 Pending surgery per Dr. Dalldorf.  Agreed to clear patient today. She will also need cardiology screening/clearance.  We discussed this today.  Form completed, will fax today.

## 2024-09-17 NOTE — Progress Notes (Signed)
 Subjective:    Patient ID: Jenna Hogan, female    DOB: [birth year], 83 y.o.   MRN: [id number]  Jenna Hogan is a very pleasant 83 y.o. female with a history of hypertension, atrial fibrillation, bilateral osteoarthritis of knees, hyperlipidemia, chronic back pain, urinary incontinence, prediabetes who presents today requesting medical surgical clearance.  She is pending left total knee replacement per Dr. Dalldorf, no scheduled date.  She will undergo lab work prior to surgery per the surgical team.  Following with cardiology for atrial fibrillation.  She has yet to receive clearance from them.  Review of Systems  Constitutional:  Negative for unexpected weight change.  HENT:  Negative for rhinorrhea.   Respiratory:  Negative for cough and shortness of breath.   Cardiovascular:  Negative for chest pain.  Gastrointestinal:  Negative for constipation and diarrhea.  Genitourinary:  Negative for difficulty urinating.  Musculoskeletal:  Positive for arthralgias.  Skin:  Negative for rash.  Allergic/Immunologic: Negative for environmental allergies.  Neurological:  Negative for dizziness and headaches.         Past Medical History:  Diagnosis Date   A-fib [facility])    Anxiety    Arthritis    Depression    Dysrhythmia    Essential hypertension    Fatigue    Generalized abdominal pain 03/09/2022   Hypercholesteremia    Urinary incontinence     Social History   Socioeconomic History   Marital status: Widowed    Spouse name: Not on file   Number of children: 3   Years of education: Not on file   Highest education level: Some college, no degree  Occupational History   Occupation: Retired Public librarian  Tobacco Use   Smoking status: Never   Smokeless tobacco: Never  Vaping Use   Vaping status: Never Used  Substance and Sexual Activity   Alcohol  use: Yes    Comment: social   Drug use: No   Sexual activity: Not on file  Other Topics Concern   Not on file   Social History Narrative   Married.   Retired. Once worked in Community education officer.   Enjoys crocheting, spending time with family, traveling.    Social Drivers of Corporate investment banker Strain: Low Risk  (03/11/2024)   Overall Financial Resource Strain (CARDIA)    Difficulty of Paying Living Expenses: Not hard at all  Food Insecurity: Patient Declined (09/15/2024)   Hunger Vital Sign    Worried About Running Out of Food in the Last Year: Patient declined    Ran Out of Food in the Last Year: Patient declined  Transportation Needs: No Transportation Needs (09/15/2024)   PRAPARE - Administrator, Civil Service (Medical): No    Lack of Transportation (Non-Medical): No  Physical Activity: Insufficiently Active (03/11/2024)   Exercise Vital Sign    Days of Exercise per Week: 4 days    Minutes of Exercise per Session: 20 min  Stress: Patient Declined (09/15/2024)   [employer] of Occupational Health - Occupational Stress Questionnaire    Feeling of Stress: Patient declined  Social Connections: Moderately Integrated (09/15/2024)   Social Connection and Isolation Panel    Frequency of Communication with Friends and Family: More than three times a week    Frequency of Social Gatherings with Friends and Family: More than three times a week    Attends Religious Services: More than 4 times per year    Active Member of [employer] or Organizations: Yes  Attends Banker Meetings: More than 4 times per year    Marital Status: Widowed  Intimate Partner Violence: Not At Risk (05/19/2024)   Humiliation, Afraid, Rape, and Kick questionnaire    Fear of Current or Ex-Partner: No    Emotionally Abused: No    Physically Abused: No    Sexually Abused: No    Past Surgical History:  Procedure Laterality Date   ABDOMINAL HYSTERECTOMY     CARPAL TUNNEL RELEASE  1998, 2002   x2   CATARACT EXTRACTION, BILATERAL     HERNIA REPAIR Bilateral    MENISCUS REPAIR Left 2011   TOTAL KNEE  ARTHROPLASTY Right 05/19/2024   Procedure: ARTHROPLASTY, KNEE, TOTAL;  Surgeon: Sheril Coy, MD;  Location: [facility];  Service: Orthopedics;  Laterality: Right;  RIGHT TOTAL KNEE ARTHROPLASTY    Family History  Problem Relation Age of Onset   Heart failure Mother    Heart disease Mother    Arthritis Mother    Breast cancer Neg Hx     No Known Allergies  Current Outpatient Medications on File Prior to Visit  Medication Sig Dispense Refill   apixaban  (ELIQUIS ) 5 MG TABS tablet Take 1 tablet (5 mg total) by mouth 2 (two) times daily. For stroke prevention 180 tablet 3   Calcium Carb-Cholecalciferol (CALCIUM 600 + D PO) Take 2 tablets by mouth daily.     cetirizine (ZYRTEC) 10 MG tablet Take 10 mg by mouth daily.     Cholecalciferol (VITAMIN D) 50 MCG (2000 UT) tablet Take 4,000 Units by mouth daily.     metoprolol  tartrate (LOPRESSOR ) 50 MG tablet TAKE 1 TABLET (50 MG TOTAL) BY MOUTH 2 (TWO) TIMES DAILY. FOR BLOOD PRESSURE. 180 tablet 3   Multiple Vitamins-Minerals (WOMENS 50+ MULTI VITAMIN) TABS Take 1 tablet by mouth daily.     Probiotic Product (PROBIOTIC PO) Take 1 capsule by mouth daily.     sertraline  (ZOLOFT ) 50 MG tablet Take 1 tablet (50 mg total) by mouth daily. For anxiety 90 tablet 1   simvastatin  (ZOCOR ) 40 MG tablet TAKE 1 TABLET BY MOUTH EVERY EVENING FOR CHOLESTEROL 90 tablet 2   hydrOXYzine  (ATARAX ) 10 MG tablet Take 1-2 tablets (10-20 mg total) by mouth 2 (two) times daily as needed for anxiety. 30 tablet 0   montelukast  (SINGULAIR ) 10 MG tablet TAKE 1 TABLET (10 MG TOTAL) BY MOUTH AT BEDTIME. FOR ALLERGIES (Patient not taking: Reported on 09/17/2024) 90 tablet 2   Multiple Vitamins-Minerals (PRESERVISION AREDS 2 PO) Take 2 capsules by mouth daily. (Patient not taking: Reported on 09/17/2024)     No current facility-administered medications on file prior to visit.    BP (!) 146/78   Pulse 84   Temp 97.8 F (36.6 C) (Temporal)   Ht 5' 2 (1.575 m)   Wt 148 lb (67.1  kg)   SpO2 98%   BMI 27.07 kg/m  Objective:   Physical Exam HENT:     Right Ear: Tympanic membrane and ear canal normal.     Left Ear: Tympanic membrane and ear canal normal.  Eyes:     Pupils: Pupils are equal, round, and reactive to light.  Cardiovascular:     Rate and Rhythm: Normal rate. Rhythm irregular.  Pulmonary:     Effort: Pulmonary effort is normal.     Breath sounds: Normal breath sounds.  Abdominal:     General: Bowel sounds are normal.     Palpations: Abdomen is soft.     Tenderness:  There is no abdominal tenderness.  Musculoskeletal:     Cervical back: Neck supple.  Skin:    General: Skin is warm and dry.  Neurological:     Mental Status: She is alert and oriented to person, place, and time.     Cranial Nerves: No cranial nerve deficit.     Deep Tendon Reflexes:     Reflex Scores:      Patellar reflexes are 2+ on the right side and 2+ on the left side. Psychiatric:        Mood and Affect: Mood normal.     Physical Exam        Assessment & Plan:  Primary osteoarthritis of left knee Assessment & Plan: Pending surgery per Dr. Dalldorf.  Agreed to clear patient today. She will also need cardiology screening/clearance.  We discussed this today.  Form completed, will fax today.   Preoperative clearance Assessment & Plan: Pending left total knee replacement per Dr. Dalldorf.  Exam today stable. Reviewed prior lab work, she will also complete lab work prior to surgery. Do recommend cardiology clearance.  Discussed this today.  Form completed, will fax to orthopedics office.     Assessment and Plan Assessment & Plan         Comer MARLA Gaskins, NP     History of Present Illness

## 2024-10-05 NOTE — Progress Notes (Addendum)
 [facility] - Clinic Note  10/19/2024   CHIEF COMPLAINT Patient presents for Retina Evaluation  HISTORY OF PRESENT ILLNESS: Jenna Hogan is a 83 y.o. female who presents to the clinic today for:  HPI     Retina Evaluation   In both eyes.  This started 2.  Duration of 2 years.  Treatments tried include artificial tears.  I, the attending physician,  performed the HPI with the patient and updated documentation appropriately.        Comments   Retina eval per Dr. Royetta for [facility]. Patient states she can see fine. Patient went to another Doctor which was on video and was not very pleased with the visit, so patient went to Dr. Royetta. Patient has been on AREDS2 for 2 years      Last edited by Valdemar Rogue, MD on 10/25/2024  3:46 PM.     Patient is happy with her vision.   Referring physician: Roney, Jenna, OD [address] [place],  [place] [address]  HISTORICAL INFORMATION:  Selected notes from the MEDICAL RECORD NUMBER Referred by Dr. Andriette Royetta LEE:  Ocular Hx- PMH-   CURRENT MEDICATIONS: No current outpatient medications on file. (Ophthalmic Drugs)   No current facility-administered medications for this visit. (Ophthalmic Drugs)   Current Outpatient Medications (Other)  Medication Sig   apixaban  (ELIQUIS ) 5 MG TABS tablet Take 1 tablet (5 mg total) by mouth 2 (two) times daily. For stroke prevention   Calcium Carb-Cholecalciferol (CALCIUM 600 + D PO) Take 2 tablets by mouth daily.   cetirizine (ZYRTEC) 10 MG tablet Take 10 mg by mouth daily.   Cholecalciferol (VITAMIN D) 50 MCG (2000 UT) tablet Take 4,000 Units by mouth daily.   hydrOXYzine  (ATARAX ) 10 MG tablet Take 1-2 tablets (10-20 mg total) by mouth 2 (two) times daily as needed for anxiety.   metoprolol  tartrate (LOPRESSOR ) 50 MG tablet TAKE 1 TABLET (50 MG TOTAL) BY MOUTH 2 (TWO) TIMES DAILY. FOR BLOOD PRESSURE.   Multiple Vitamins-Minerals (WOMENS 50+ MULTI VITAMIN) TABS Take 1 tablet by  mouth daily.   Probiotic Product (PROBIOTIC PO) Take 1 capsule by mouth daily.   sertraline  (ZOLOFT ) 50 MG tablet Take 1 tablet (50 mg total) by mouth daily. For anxiety   simvastatin  (ZOCOR ) 40 MG tablet TAKE 1 TABLET BY MOUTH EVERY EVENING FOR CHOLESTEROL   montelukast  (SINGULAIR ) 10 MG tablet TAKE 1 TABLET (10 MG TOTAL) BY MOUTH AT BEDTIME. FOR ALLERGIES (Patient not taking: Reported on 09/17/2024)   Multiple Vitamins-Minerals (PRESERVISION AREDS 2 PO) Take 2 capsules by mouth daily. (Patient not taking: Reported on 09/17/2024)   No current facility-administered medications for this visit. (Other)   REVIEW OF SYSTEMS: ROS   Positive for: Musculoskeletal, Cardiovascular, Eyes Last edited by German Olam BRAVO, COT on 10/19/2024  2:00 PM.     ALLERGIES Not on File PAST MEDICAL HISTORY Past Medical History:  Diagnosis Date   A-fib [facility])    Anxiety    Arthritis    Depression    Dysrhythmia    Essential hypertension    Fatigue    Generalized abdominal pain 03/09/2022   Hypercholesteremia    Urinary incontinence    Past Surgical History:  Procedure Laterality Date   ABDOMINAL HYSTERECTOMY     CARPAL TUNNEL RELEASE  1998, 2002   x2   CATARACT EXTRACTION     CATARACT EXTRACTION, BILATERAL     HERNIA REPAIR Bilateral    MENISCUS REPAIR Left 2011  TOTAL KNEE ARTHROPLASTY Right 05/19/2024   Procedure: ARTHROPLASTY, KNEE, TOTAL;  Surgeon: Sheril Coy, MD;  Location: [facility];  Service: Orthopedics;  Laterality: Right;  RIGHT TOTAL KNEE ARTHROPLASTY   FAMILY HISTORY Family History  Problem Relation Age of Onset   Heart failure Mother    Heart disease Mother    Arthritis Mother    Breast cancer Neg Hx    SOCIAL HISTORY Social History   Tobacco Use   Smoking status: Never   Smokeless tobacco: Never  Vaping Use   Vaping status: Never Used  Substance Use Topics   Alcohol  use: Yes    Comment: social   Drug use: No       OPHTHALMIC EXAM:  Base Eye Exam     Visual  Acuity (Snellen - Linear)       Right Left   Dist cc 20/40 -2 20/60 -2   Dist ph cc 20/40 20/ni    Correction: Glasses         Tonometry (Tonopen, 2:18 PM)       Right Left   Pressure 22 18         Tonometry Comments   Patient squeezing        Pupils       Dark Light Shape React APD   Right 3 2 Round Brisk None   Left 3 2 Round Brisk None         Visual Fields (Counting fingers)       Left Right    Full Full         Extraocular Movement       Right Left    Full, Ortho Full, Ortho         Neuro/Psych     Oriented x3: Yes   Mood/Affect: Normal         Dilation     Both eyes: 1.0% Mydriacyl, 2.5% Phenylephrine  @ 2:18 PM           Slit Lamp and Fundus Exam     External Exam       Right Left   External Normal Normal         Slit Lamp Exam       Right Left   Lids/Lashes Dermatochalasis - upper lid Dermatochalasis - upper lid   Conjunctiva/Sclera White and quiet White and quiet   Cornea Arcus, Debris in tear film, Well healed temporal cataract wound, trace PEE Arcus, 1+ Punctate epithelial erosions, Debris in tear film, Well healed temporal cataract wound   Anterior Chamber Deep and clear Deep and clear   Iris Round and dilated Round and dilated   Lens PC IOL in good postition PC IOL in good postition, 1-2+ Posterior capsular opacification   Anterior Vitreous Vitreous syneresis Vitreous syneresis, scattered Vitreous condensations         Fundus Exam       Right Left   Disc 2+ Pallor, Sharp rim, mild PPP Pallor, Sharp rim, mild PPP   C/D Ratio 0.3 0.3   Macula Flat, Blunted foveal reflex, central PED's, soft drusen, RPE mottling and clumping, No heme or edema Flat, Blunted foveal reflex, central PED's, soft drusen, RPE mottling and clumping, No heme or edema   Vessels Vascular attenuation, Tortuous Vascular attenuation, Tortuous   Periphery Attached, focal pigment CR scarring 0430, mild peripheral drusen, no heme Attached, No  heme, mild Reticular degeneration           Refraction     Wearing Rx  Sphere Cylinder Axis Add   Right -0.50 +2.00 178 +2.50   Left -1.25 +2.25 173 +2.50    Type: Bifocal         Manifest Refraction (Auto)       Sphere Cylinder Axis Dist VA   Right -0.75 +2.25 178 20/40   Left -1.50 +2.75 175 20/40           IMAGING AND PROCEDURES  Imaging and Procedures for 10/19/2024  OCT, Retina - OU - Both Eyes       Right Eye Quality was good. Central Foveal Thickness: 333. Progression has no prior data. Findings include no IRF, no SRF, abnormal foveal contour, subretinal hyper-reflective material, pigment epithelial detachment (Central PED's with focal [facility]).   Left Eye Quality was good. Central Foveal Thickness: 364. Progression has no prior data. Findings include no IRF, no SRF, abnormal foveal contour, subretinal hyper-reflective material, pigment epithelial detachment (Central PED with prominent [facility]).   Notes *Images captured and stored on drive  Diagnosis / Impression:  OD: Central PED's with focal SRHM OS: Central PED with prominent [facility]  Clinical management:  See below  Abbreviations: NFP - Normal foveal profile. CME - cystoid macular edema. PED - pigment epithelial detachment. IRF - intraretinal fluid. SRF - subretinal fluid. EZ - ellipsoid zone. ERM - epiretinal membrane. ORA - outer retinal atrophy. ORT - outer retinal tubulation. SRHM - subretinal hyper-reflective material. IRHM - intraretinal hyper-reflective material      Fluorescein Angiography Optos (Transit OS)       Right Eye Progression has no prior data. Early phase findings include normal observations. Mid/Late phase findings include staining (Mild late peri foveal staining).   Left Eye Progression has no prior data. Early phase findings include normal observations. Mid/Late phase findings include staining (Very faint late staining peri fovea-- greatest superior peri fovea).    Notes *Images captured and stored on drive  Diagnosis / Impression:  No CNV OU -- nonexudative ARMD OU OD: Mild late peri foveal staining OS: Very faint late staining peri fovea-- greatest superior peri fovea  Clinical management:  See below  Abbreviations: NFP - Normal foveal profile. CME - cystoid macular edema. PED - pigment epithelial detachment. IRF - intraretinal fluid. SRF - subretinal fluid. EZ - ellipsoid zone. ERM - epiretinal membrane. ORA - outer retinal atrophy. ORT - outer retinal tubulation. SRHM - subretinal hyper-reflective material. IRHM - intraretinal hyper-reflective material           ASSESSMENT/PLAN:   ICD-10-CM   1. Intermediate stage nonexudative age-related macular degeneration of both eyes  H35.3132 OCT, Retina - OU - Both Eyes    2. Hypertensive retinopathy of both eyes  H35.033 OCT, Retina - OU - Both Eyes    Fluorescein Angiography Optos (Transit OS)    3. Essential hypertension  I10     4. Pseudophakia of both eyes  Z96.1      Age related macular degeneration, non-exudative, OU - The incidence, anatomy, and pathology of dry AMD, risk of progression, and the AREDS and AREDS 2 studies including smoking risks discussed with patient. - OCT shows OD: Central PED's with focal SRHM, OS: Central PED with prominent SRHM - FA (10.20.25) shows no leakage, no CNV OU; OD: Mild late peri foveal staining, OS: Very faint late staining peri fovea-- greatest superior peri fovea  - Recommend amsler grid monitoring  - f/u 2-3 months, DFE, OCT   2,3. Hypertensive retinopathy OU - discussed importance of tight BP control - monitor  4. Pseudophakia OU  - s/p CE/IOL  - IOL in good position, doing well  - monitor   Ophthalmic Meds Ordered this visit:  No orders of the defined types were placed in this encounter.    Return in about 3 months (around 01/19/2025) for f/u, Non Ex. AMD, DFE, OCT.  There are no Patient Instructions on file for this  visit.  Explained the diagnoses, plan, and follow up with the patient and they expressed understanding.  Patient expressed understanding of the importance of proper follow up care.   This document serves as a record of services personally performed by Redell JUDITHANN Hans, MD, PhD. It was created on their behalf by Avelina Pereyra, COA an ophthalmic technician. The creation of this record is the provider's dictation and/or activities during the visit.   Electronically signed by: Avelina GORMAN Pereyra, COT  10/25/24  3:50 PM   Redell JUDITHANN Hans, M.D., Ph.D. Diseases & Surgery of the Retina and Vitreous [facility] 10/19/2024  I have reviewed the above documentation for accuracy and completeness, and I agree with the above. Redell JUDITHANN Hans, M.D., Ph.D. 10/25/24 3:50 PM   Abbreviations: M myopia (nearsighted); A astigmatism; H hyperopia (farsighted); P presbyopia; Mrx spectacle prescription;  CTL contact lenses; OD right eye; OS left eye; OU both eyes  XT exotropia; ET esotropia; PEK punctate epithelial keratitis; PEE punctate epithelial erosions; DES dry eye syndrome; MGD meibomian gland dysfunction; ATs artificial tears; PFAT's preservative free artificial tears; NSC nuclear sclerotic cataract; PSC posterior subcapsular cataract; ERM epi-retinal membrane; PVD posterior vitreous detachment; RD retinal detachment; DM diabetes mellitus; DR diabetic retinopathy; NPDR non-proliferative diabetic retinopathy; PDR proliferative diabetic retinopathy; CSME clinically significant macular edema; DME diabetic macular edema; dbh dot blot hemorrhages; CWS cotton wool spot; POAG primary open angle glaucoma; C/D cup-to-disc ratio; HVF humphrey visual field; GVF goldmann visual field; OCT optical coherence tomography; IOP intraocular pressure; BRVO Branch retinal vein occlusion; CRVO central retinal vein occlusion; CRAO central retinal artery occlusion; BRAO branch retinal artery occlusion; RT retinal tear; SB  scleral buckle; PPV pars plana vitrectomy; VH Vitreous hemorrhage; PRP panretinal laser photocoagulation; IVK intravitreal kenalog ; VMT vitreomacular traction; MH Macular hole;  NVD neovascularization of the disc; NVE neovascularization elsewhere; AREDS age related eye disease study; ARMD age related macular degeneration; POAG primary open angle glaucoma; EBMD epithelial/anterior basement membrane dystrophy; ACIOL anterior chamber intraocular lens; IOL intraocular lens; PCIOL posterior chamber intraocular lens; Phaco/IOL phacoemulsification with intraocular lens placement; PRK photorefractive keratectomy; LASIK laser assisted in situ keratomileusis; HTN hypertension; DM diabetes mellitus; COPD chronic obstructive pulmonary disease

## 2024-10-12 ENCOUNTER — Encounter (INDEPENDENT_AMBULATORY_CARE_PROVIDER_SITE_OTHER): Admitting: Ophthalmology

## 2024-10-19 ENCOUNTER — Encounter (INDEPENDENT_AMBULATORY_CARE_PROVIDER_SITE_OTHER): Payer: Self-pay | Admitting: Ophthalmology

## 2024-10-19 ENCOUNTER — Ambulatory Visit (INDEPENDENT_AMBULATORY_CARE_PROVIDER_SITE_OTHER): Admitting: Ophthalmology

## 2024-10-19 VITALS — BP 160/96 | HR 99

## 2024-10-19 DIAGNOSIS — H3581 Retinal edema: Secondary | ICD-10-CM

## 2024-10-19 DIAGNOSIS — H35033 Hypertensive retinopathy, bilateral: Secondary | ICD-10-CM

## 2024-10-19 DIAGNOSIS — Z961 Presence of intraocular lens: Secondary | ICD-10-CM

## 2024-10-19 DIAGNOSIS — I10 Essential (primary) hypertension: Secondary | ICD-10-CM

## 2024-10-19 DIAGNOSIS — H353132 Nonexudative age-related macular degeneration, bilateral, intermediate dry stage: Secondary | ICD-10-CM | POA: Diagnosis not present

## 2024-10-25 ENCOUNTER — Encounter (INDEPENDENT_AMBULATORY_CARE_PROVIDER_SITE_OTHER): Payer: Self-pay | Admitting: Ophthalmology

## 2024-11-06 NOTE — Progress Notes (Signed)
 Sent message, via epic in basket, requesting orders in epic from Careers adviser.

## 2024-11-11 ENCOUNTER — Other Ambulatory Visit: Payer: Self-pay | Admitting: Orthopaedic Surgery

## 2024-11-11 NOTE — Care Plan (Signed)
 Ortho Bundle Case Management Note  Patient Details  Name: Jenna Hogan MRN: [id number] Date of Birth: 1941-01-20  Patient will discharge to home with family to assist. Has DME at home. HHPT referral to [facility] and OPPT set up with [facility] [address] [facility] Patient and MD in agreement. Choice offered.                     DME Arranged:    DME Agency:     HH Arranged:  PT HH Agency:  [facility]  Additional Comments: Please contact me with any questions of if this plan should need to change.  Charlies Pitch,  RN,BSN,MHA,CCM  [facility] Orthopaedic Specialist  [phone] 11/11/2024, 3:16 PM

## 2024-11-11 NOTE — Progress Notes (Signed)
Second request for pre op orders spoke with: Bethena Roys

## 2024-11-12 NOTE — Progress Notes (Signed)
 COVID Vaccine received:  []  No [x]  Yes Date of any COVID positive Test in last 90 days: no PCP - Pierce Gaskins NP Cardiologist - Darryle Decent MD  Chest x-ray -  EKG -  08/21/24 Epic Stress Test - 04/21/24 Epic ECHO - 03/12/24 Epic Cardiac Cath -   Cardiac clearance 9/9/25[facility] Hazy cleaver NP-C  Bowel Prep - [x]  No  []   Yes ______  Pacemaker / ICD device [x]  No []  Yes   Spinal Cord Stimulator:[x]  No []  Yes       History of Sleep Apnea? [x]  No []  Yes   CPAP used?- [x]  No []  Yes    Does the patient monitor blood sugar?          [x]  No []  Yes  []  N/A  Patient has: [x]  NO Hx DM   []  Pre-DM                 []  DM1  []   DM2 Does patient have a [employer] or Dexacom? []  No []  Yes   Fasting Blood Sugar Ranges-  Checks Blood Sugar _____ times a day  GLP1 agonist / usual dose - no GLP1 instructions: no SGLT-2 instructions:   Blood Thinner / Instructions:Eliquis - last dose to be 11/20/24  6PM Aspirin Instructions:no  Comments:   Activity level: Patient is able  to climb a flight of stairs without difficulty; [x]  No CP  [x]  No SOB,    Patient can perform ADLs without assistance.   Anesthesia review: A-fib, on Eliquis , HTN  Patient denies shortness of breath, fever, cough and chest pain at PAT appointment.  Patient verbalized understanding and agreement to the Pre-Surgical Instructions that were given to them at this PAT appointment. Patient was also educated of the need to review these PAT instructions again prior to his/her surgery.I reviewed the appropriate phone numbers to call if they have any and questions or concerns.

## 2024-11-12 NOTE — Patient Instructions (Signed)
 SURGICAL WAITING ROOM VISITATION  Patients having surgery or a procedure may have no more than 2 support people in the waiting area - these visitors may rotate.    Children under the age of 52 must have an adult with them who is not the patient.  Visitors with respiratory illnesses are discouraged from visiting and should remain at home.  If the patient needs to stay at the hospital during part of their recovery, the visitor guidelines for inpatient rooms apply. Pre-op nurse will coordinate an appropriate time for 1 support person to accompany patient in pre-op.  This support person may not rotate.    Please refer to the [facility] website for the visitor guidelines for Inpatients (after your surgery is over and you are in a regular room).       Your procedure is scheduled on: 11/24/24   Report to [facility] Main Entrance    Report to admitting at 5:15 AM   Call this number if you have problems the morning of surgery [phone]   Do not eat food or drink liquids :After Midnight.but may have sips of water to take meds.     Oral Hygiene is also important to reduce your risk of infection.                                    Remember - BRUSH YOUR TEETH THE MORNING OF SURGERY WITH YOUR REGULAR TOOTHPASTE     Stop all vitamins and herbal supplements 7 days before surgery.   Take these medicines the morning of surgery with A SIP OF WATER: Tylenol  if needed, cetirizine, metoprolol , sertraline , simvastatin              You may not have any metal on your body including hair pins, jewelry, and body piercing             Do not wear make-up, lotions, powders, perfumes/cologne, or deodorant  Do not wear nail polish including gel and S&S, artificial/acrylic nails, or any other type of covering on natural nails including finger and toenails. If you have artificial nails, gel coating, etc. that needs to be removed by a nail salon please have this removed prior to surgery or surgery may  need to be canceled/ delayed if the surgeon/ anesthesia feels like they are unable to be safely monitored.   Do not shave  48 hours prior to surgery.               Do not bring valuables to the hospital.  IS NOT             RESPONSIBLE   FOR VALUABLES.   Contacts, glasses, dentures or bridgework may not be worn into surgery.   DO NOT BRING YOUR HOME MEDICATIONS TO THE HOSPITAL. PHARMACY WILL DISPENSE MEDICATIONS LISTED ON YOUR MEDICATION LIST TO YOU DURING YOUR ADMISSION IN THE HOSPITAL!    Patients discharged on the day of surgery will not be allowed to drive home.  Someone NEEDS to stay with you for the first 24 hours after anesthesia.   Special Instructions: Bring a copy of your healthcare power of attorney and living will documents the day of surgery if you haven't scanned them before.              Please read over the following fact sheets you were given: IF YOU HAVE QUESTIONS ABOUT YOUR PRE-OP INSTRUCTIONS PLEASE CALL  [phone] Jenna Hogan   If you received a COVID test during your pre-op visit  it is requested that you wear a mask when out in public, stay away from anyone that may not be feeling well and notify your surgeon if you develop symptoms. If you test positive for Covid or have been in contact with anyone that has tested positive in the last 10 days please notify you surgeon.      Pre-operative 4 CHG Bath Instructions  DYNA-Hex 4 Chlorhexidine  Gluconate 4% Solution Antiseptic 4 fl. oz   You can play a key role in reducing the risk of infection after surgery. Your skin needs to be as free of germs as possible. You can reduce the number of germs on your skin by washing with CHG (chlorhexidine  gluconate) soap before surgery. CHG is an antiseptic soap that kills germs and continues to kill germs even after washing.   DO NOT use if you have an allergy to chlorhexidine /CHG or antibacterial soaps. If your skin becomes reddened or irritated, stop using the CHG and notify  one of our RNs at   Please shower with the CHG soap starting 4 days before surgery using the following schedule:     Please keep in mind the following:  DO NOT shave, including legs and underarms, starting the day of your first shower.   You may shave your face at any point before/day of surgery.  Place clean sheets on your bed the day you start using CHG soap. Use a clean washcloth (not used since being washed) for each shower. DO NOT sleep with pets once you start using the CHG.  CHG Shower Instructions:  If you choose to wash your hair and private area, wash first with your normal shampoo/soap.  After you use shampoo/soap, rinse your hair and body thoroughly to remove shampoo/soap residue.  Turn the water OFF and apply about 3 tablespoons (45 ml) of CHG soap to a CLEAN washcloth.  Apply CHG soap ONLY FROM YOUR NECK DOWN TO YOUR TOES (washing for 3-5 minutes)  DO NOT use CHG soap on face, private areas, open wounds, or sores.  Pay special attention to the area where your surgery is being performed.  If you are having back surgery, having someone wash your back for you may be helpful. Wait 2 minutes after CHG soap is applied, then you may rinse off the CHG soap.  Pat dry with a clean towel  Put on clean clothes/pajamas   If you choose to wear lotion, please use ONLY the CHG-compatible lotions on the back of this paper.     Additional instructions for the day of surgery: DO NOT APPLY any lotions, deodorants, cologne, or perfumes.   Put on clean/comfortable clothes.  Brush your teeth.  Ask your nurse before applying any prescription medications to the skin.   CHG Compatible Lotions   Aveeno Moisturizing lotion  Cetaphil Moisturizing Cream  Cetaphil Moisturizing Lotion  Clairol Herbal Essence Moisturizing Lotion, Dry Skin  Clairol Herbal Essence Moisturizing Lotion, Extra Dry Skin  Clairol Herbal Essence Moisturizing Lotion, Normal Skin  Curel Age Defying Therapeutic  Moisturizing Lotion with Alpha Hydroxy  Curel Extreme Care Body Lotion  Curel Soothing Hands Moisturizing Hand Lotion  Curel Therapeutic Moisturizing Cream, Fragrance-Free  Curel Therapeutic Moisturizing Lotion, Fragrance-Free  Curel Therapeutic Moisturizing Lotion, Original Formula  Eucerin Daily Replenishing Lotion  Eucerin Dry Skin Therapy Plus Alpha Hydroxy Crme  Eucerin Dry Skin Therapy Plus Alpha Hydroxy Lotion  Eucerin Original Crme  Eucerin  Original Lotion  Eucerin Plus Crme Eucerin Plus Lotion  Eucerin TriLipid Replenishing Lotion  Keri Anti-Bacterial Hand Lotion  Keri Deep Conditioning Original Lotion Dry Skin Formula Softly Scented  Keri Deep Conditioning Original Lotion, Fragrance Free Sensitive Skin Formula  Keri Lotion Fast Absorbing Fragrance Free Sensitive Skin Formula  Keri Lotion Fast Absorbing Softly Scented Dry Skin Formula  Keri Original Lotion  Keri Skin Renewal Lotion Keri Silky Smooth Lotion  Keri Silky Smooth Sensitive Skin Lotion  Nivea Body Creamy Conditioning Oil  Nivea Body Extra Enriched Teacher, Adult Education Moisturizing Lotion Nivea Crme  Nivea Skin Firming Lotion  NutraDerm 30 Skin Lotion  NutraDerm Skin Lotion  NutraDerm Therapeutic Skin Cream  NutraDerm Therapeutic Skin Lotion  ProShield Protective Hand Cream

## 2024-11-13 ENCOUNTER — Encounter (HOSPITAL_COMMUNITY)
Admission: RE | Admit: 2024-11-13 | Discharge: 2024-11-13 | Disposition: A | Discharge status: Home / Self Care | Source: Ambulatory Visit | Attending: Orthopaedic Surgery | Admitting: Orthopaedic Surgery

## 2024-11-13 ENCOUNTER — Other Ambulatory Visit: Payer: Self-pay

## 2024-11-13 VITALS — BP 148/87 | HR 89 | Temp 98.2°F | Resp 18 | Ht 61.0 in | Wt 147.0 lb

## 2024-11-13 DIAGNOSIS — Z79899 Other long term (current) drug therapy: Secondary | ICD-10-CM | POA: Insufficient documentation

## 2024-11-13 DIAGNOSIS — Z7901 Long term (current) use of anticoagulants: Secondary | ICD-10-CM | POA: Diagnosis not present

## 2024-11-13 DIAGNOSIS — Z01812 Encounter for preprocedural laboratory examination: Secondary | ICD-10-CM | POA: Insufficient documentation

## 2024-11-13 DIAGNOSIS — I4891 Unspecified atrial fibrillation: Secondary | ICD-10-CM | POA: Insufficient documentation

## 2024-11-13 DIAGNOSIS — M1712 Unilateral primary osteoarthritis, left knee: Secondary | ICD-10-CM | POA: Diagnosis not present

## 2024-11-13 DIAGNOSIS — I081 Rheumatic disorders of both mitral and tricuspid valves: Secondary | ICD-10-CM | POA: Diagnosis not present

## 2024-11-13 DIAGNOSIS — I272 Pulmonary hypertension, unspecified: Secondary | ICD-10-CM | POA: Insufficient documentation

## 2024-11-13 DIAGNOSIS — Z01818 Encounter for other preprocedural examination: Secondary | ICD-10-CM | POA: Diagnosis present

## 2024-11-13 DIAGNOSIS — Z96651 Presence of right artificial knee joint: Secondary | ICD-10-CM | POA: Insufficient documentation

## 2024-11-13 DIAGNOSIS — F419 Anxiety disorder, unspecified: Secondary | ICD-10-CM | POA: Diagnosis not present

## 2024-11-13 DIAGNOSIS — I10 Essential (primary) hypertension: Secondary | ICD-10-CM | POA: Diagnosis not present

## 2024-11-13 LAB — CBC
HCT: 40.2 % (ref 36.0–46.0)
Hemoglobin: 13.6 g/dL (ref 12.0–15.0)
MCH: 33.1 pg (ref 26.0–34.0)
MCHC: 33.8 g/dL (ref 30.0–36.0)
MCV: 97.8 fL (ref 80.0–100.0)
Platelets: 263 K/uL (ref 150–400)
RBC: 4.11 MIL/uL (ref 3.87–5.11)
RDW: 15.4 % (ref 11.5–15.5)
WBC: 7.4 K/uL (ref 4.0–10.5)
nRBC: 0 % (ref 0.0–0.2)

## 2024-11-13 LAB — BASIC METABOLIC PANEL WITH GFR
Anion gap: 10 (ref 5–15)
BUN: 26 mg/dL — ABNORMAL HIGH (ref 8–23)
CO2: 28 mmol/L (ref 22–32)
Calcium: 9.9 mg/dL (ref 8.9–10.3)
Chloride: 96 mmol/L — ABNORMAL LOW (ref 98–111)
Creatinine, Ser: 0.68 mg/dL (ref 0.44–1.00)
GFR, Estimated: >60 mL/min (ref >60)
Glucose, Bld: 97 mg/dL (ref 70–99)
Potassium: 4.1 mmol/L (ref 3.5–5.1)
Sodium: 133 mmol/L — ABNORMAL LOW (ref 135–145)

## 2024-11-13 LAB — SURGICAL PCR SCREEN
MRSA, PCR: NEGATIVE
Staphylococcus aureus: NEGATIVE

## 2024-11-18 NOTE — Progress Notes (Signed)
 Case: [id number] Date/Time: 11/24/24 0715   Procedure: ARTHROPLASTY, KNEE, TOTAL (Left: Knee)   Anesthesia type: Spinal   Pre-op diagnosis: LEFT KNEE DEGENERATIVE JOINT DISEASE   Location: [facility] ROOM 06 / [facility]   Surgeons: Sheril Coy, MD       DISCUSSION: Jenna Hogan is an 83 yo female with PMH of HTN, A.fib on Eliquis , moderate pHTN, severe TR, mild MR, anxiety, arthritis.   Patient underwent R TKA on 05/19/24 with spinal anesthesia. No complications noted.  Seen by Cardiology in f/u on 08/21/24. She is on Eliquis  and Metoprolol  for persistent A.fib. In April, she underwent a stress test, which was negative, and an echocardiogram showed an ejection fraction of 55% with severe TR. Per Dr. Barbaraann:  Preop Assessment - Can complete >4 METS. Echo normal function and negative stress. May proceed to surgery. Understands to hold eliquis  before surgery.  LD Eliquis : 11/21 at 6PM  VS: BP (!) 148/87   Pulse 89   Temp 36.8 C (Oral)   Resp 18   Ht 5' 1 (1.549 m)   Wt 66.7 kg   SpO2 99%   BMI 27.78 kg/m   PROVIDERS: Gretta Comer POUR, NP   LABS: Labs reviewed: Acceptable for surgery. (all labs ordered are listed, but only abnormal results are displayed)  Labs Reviewed  BASIC METABOLIC PANEL WITH GFR - Abnormal; Notable for the following components:      Result Value   Sodium 133 (*)    Chloride 96 (*)    BUN 26 (*)    All other components within normal limits  SURGICAL PCR SCREEN  CBC     IMAGES:   EKG 08/21/24:  A.fib   CV:   Stress test 04/21/24:   Baseline ECG is abnormal. ECG rhythm shows atrial fibrillation.   A pharmacological stress test was performed using IV Lexiscan  0.4mg  over 10 seconds performed without concurrent submaximal exercise. The patient reported dyspnea and headaches during the stress test. Normal blood pressure and normal heart rate response noted during stress. Heart rate recovery was normal.   No ST deviation was noted. ECG was  interpretable and without significant changes. The ECG was not diagnostic due to pharmacologic protocol.   LV perfusion is abnormal. There is no evidence of ischemia. There is no evidence of infarction. Defect 1: There is a small defect with mild reduction in uptake present in the apical apex location(s) that is fixed. Consistent with artifact caused by bowel tracer uptake and diaphragmatic attenuation.   Left ventricular function is abnormal. Global function is mildly reduced. Nuclear stress EF: 43%. The left ventricular ejection fraction is moderately decreased (30-44%). End diastolic cavity size is normal. End systolic cavity size is normal. No evidence of transient ischemic dilation (TID) noted.   Findings are consistent with no ischemia. The study is intermediate risk due to LV dysfunction.   Echo 03/12/24:   IMPRESSIONS      1. Left ventricular ejection fraction, by estimation, is 55%. The left ventricle has severely decreased function. The left ventricle has no regional wall motion abnormalities. Left ventricular diastolic function could not be evaluated.  2. Right ventricular systolic function is normal. The right ventricular size is normal. There is moderately elevated pulmonary artery systolic pressure. The estimated right ventricular systolic pressure is 45.9 mmHg.  3. Left atrial size was moderately dilated.  4. Right atrial size was moderately dilated.  5. The mitral valve is normal in structure. Mild mitral valve regurgitation. No evidence of mitral stenosis.  6. Tricuspid valve regurgitation is severe.  7. The aortic valve is tricuspid. There is mild calcification of the aortic valve. Aortic valve regurgitation is trivial. No aortic stenosis is present.  8. The inferior vena cava is dilated in size with <50% respiratory variability, suggesting right atrial pressure of 15 mmHg.    Past Medical History:  Diagnosis Date   A-fib [facility])    Anxiety    Arthritis    Depression     Dysrhythmia    Essential hypertension    Fatigue    Generalized abdominal pain 03/09/2022   Hypercholesteremia    Urinary incontinence     Past Surgical History:  Procedure Laterality Date   ABDOMINAL HYSTERECTOMY     CARPAL TUNNEL RELEASE  1998, 2002   x2   CATARACT EXTRACTION     CATARACT EXTRACTION, BILATERAL     HERNIA REPAIR Bilateral    MENISCUS REPAIR Left 2011   TOTAL KNEE ARTHROPLASTY Right 05/19/2024   Procedure: ARTHROPLASTY, KNEE, TOTAL;  Surgeon: Sheril Coy, MD;  Location: [facility];  Service: Orthopedics;  Laterality: Right;  RIGHT TOTAL KNEE ARTHROPLASTY    MEDICATIONS:  Acetaminophen  (TYLENOL  PO)   apixaban  (ELIQUIS ) 5 MG TABS tablet   Calcium Carb-Cholecalciferol (CALCIUM 600 + D PO)   cetirizine (ZYRTEC) 10 MG tablet   hydrOXYzine  (ATARAX ) 10 MG tablet   metoprolol  tartrate (LOPRESSOR ) 50 MG tablet   montelukast  (SINGULAIR ) 10 MG tablet   Multiple Vitamins-Minerals (PRESERVISION AREDS 2 PO)   Multiple Vitamins-Minerals (WOMENS 50+ MULTI VITAMIN) TABS   Probiotic Product (PROBIOTIC PO)   sertraline  (ZOLOFT ) 50 MG tablet   simvastatin  (ZOCOR ) 40 MG tablet   VITAMIN D PO   No current facility-administered medications for this encounter.   Burnard CHRISTELLA Odis DEVONNA [facility] Short Stay/Anesthesiology [facility] Phone [phone] 11/18/2024 9:05 AM

## 2024-11-18 NOTE — Anesthesia Preprocedure Evaluation (Addendum)
 Anesthesia Evaluation  Patient identified by MRN, date of birth, ID band Patient awake    Reviewed: Allergy & Precautions, NPO status , Patient's Chart, lab work & pertinent test results, reviewed documented beta blocker date and time   History of Anesthesia Complications Negative for: history of anesthetic complications  Airway Mallampati: II  TM Distance: >3 FB Neck ROM: Full    Dental  (+) Teeth Intact, Dental Advisory Given   Pulmonary neg pulmonary ROS   breath sounds clear to auscultation       Cardiovascular hypertension, Pt. on home beta blockers and Pt. on medications pulmonary hypertension+ dysrhythmias (on Eliquis  (last dose 11/21)) Atrial Fibrillation  Rhythm:Regular Rate:Normal  TTE (02/2024): 1. Left ventricular ejection fraction, by estimation, is 55%. The left  ventricle has severely decreased function. The left ventricle has no  regional wall motion abnormalities. Left ventricular diastolic function  could not be evaluated.   2. Right ventricular systolic function is normal. The right ventricular  size is normal. There is moderately elevated pulmonary artery systolic  pressure. The estimated right ventricular systolic pressure is 45.9 mmHg.   3. Left atrial size was moderately dilated.   4. Right atrial size was moderately dilated.   5. The mitral valve is normal in structure. Mild mitral valve  regurgitation. No evidence of mitral stenosis.   6. Tricuspid valve regurgitation is severe.   7. The aortic valve is tricuspid. There is mild calcification of the  aortic valve. Aortic valve regurgitation is trivial. No aortic stenosis is  present.   8. The inferior vena cava is dilated in size with <50% respiratory  variability, suggesting right atrial pressure of 15 mmHg.     Neuro/Psych  PSYCHIATRIC DISORDERS Anxiety Depression       GI/Hepatic ,neg GERD  ,,  Endo/Other  neg diabetes    Renal/GU Renal disease      Musculoskeletal  (+) Arthritis , Osteoarthritis,    Abdominal   Peds  Hematology Hgb 13.6, Plts 263K (11/13/24)   Anesthesia Other Findings   Reproductive/Obstetrics                              Anesthesia Physical Anesthesia Plan  ASA: 3  Anesthesia Plan: Spinal and MAC   Post-op Pain Management: Regional block*   Induction: Intravenous  PONV Risk Score and Plan: 2 and Propofol  infusion and Treatment may vary due to age or medical condition  Airway Management Planned: Natural Airway and Nasal Cannula  Additional Equipment:   Intra-op Plan:   Post-operative Plan: Extubation in OR  Informed Consent:      Dental advisory given  Plan Discussed with: CRNA  Anesthesia Plan Comments: (See PAT note from 11/14)         Anesthesia Quick Evaluation

## 2024-11-20 NOTE — H&P (Signed)
 TOTAL KNEE ADMISSION H&P  Patient is being admitted for left total knee arthroplasty.  Subjective:  Chief Complaint:left knee pain.  HPI: Jenna Hogan, 83 y.o. female, has a history of pain and functional disability in the left knee due to arthritis and has failed non-surgical conservative treatments for greater than 12 weeks to includeNSAID's and/or analgesics, corticosteriod injections, flexibility and strengthening excercises, supervised PT with diminished ADL's post treatment, use of assistive devices, weight reduction as appropriate, and activity modification.  Onset of symptoms was gradual, starting 5 years ago with gradually worsening course since that time. The patient noted prior procedures on the knee to include  arthroscopy on the left knee(s).  Patient currently rates pain in the left knee(s) at 10 out of 10 with activity. Patient has night pain, worsening of pain with activity and weight bearing, pain that interferes with activities of daily living, crepitus, and joint swelling.  Patient has evidence of subchondral cysts, subchondral sclerosis, periarticular osteophytes, and joint space narrowing by imaging studies. There is no active infection.  Patient Active Problem List   Diagnosis Date Noted   Osteoarthritis of left knee 09/17/2024   Primary localized osteoarthritis of right knee 05/19/2024   Primary osteoarthritis of right knee 05/19/2024   Osteoarthritis of knees, bilateral 02/12/2024   Environmental and seasonal allergies 02/12/2024   Preoperative clearance 02/12/2024   Atrial fibrillation (HCC) 02/12/2024   Left sided sciatica 09/20/2023   Acute hip pain, left 09/20/2023   Prediabetes 03/28/2023   Lower extremity pain, anterior, left 09/21/2022   Encounter for annual general medical examination with abnormal findings in adult 03/09/2022   Microscopic hematuria 03/09/2022   GAD (generalized anxiety disorder) 03/03/2021   Urinary incontinence 03/03/2021   Chronic  back pain 02/17/2020   Medicare annual wellness visit, subsequent 02/17/2020   Hyperlipidemia 06/03/2017   Essential hypertension 06/03/2017   Obesity (BMI 30.0-34.9) 06/03/2017   Past Medical History:  Diagnosis Date   A-fib [facility])    Anxiety    Arthritis    Depression    Dysrhythmia    Essential hypertension    Fatigue    Generalized abdominal pain 03/09/2022   Hypercholesteremia    Urinary incontinence     Past Surgical History:  Procedure Laterality Date   ABDOMINAL HYSTERECTOMY     CARPAL TUNNEL RELEASE  1998, 2002   x2   CATARACT EXTRACTION     CATARACT EXTRACTION, BILATERAL     HERNIA REPAIR Bilateral    MENISCUS REPAIR Left 2011   TOTAL KNEE ARTHROPLASTY Right 05/19/2024   Procedure: ARTHROPLASTY, KNEE, TOTAL;  Surgeon: Sheril Coy, MD;  Location[facility];  Service: Orthopedics;  Laterality: Right;  RIGHT TOTAL KNEE ARTHROPLASTY    No current facility-administered medications for this encounter.   Current Outpatient Medications  Medication Sig Dispense Refill Last Dose/Taking   Acetaminophen  (TYLENOL  PO) Take 1 tablet by mouth daily as needed (pain).   Taking As Needed   apixaban  (ELIQUIS ) 5 MG TABS tablet Take 1 tablet (5 mg total) by mouth 2 (two) times daily. For stroke prevention 180 tablet 3 Taking   Calcium Carb-Cholecalciferol (CALCIUM 600 + D PO) Take 1 tablet by mouth daily.   Taking   cetirizine (ZYRTEC) 10 MG tablet Take 10 mg by mouth daily.   Taking   metoprolol  tartrate (LOPRESSOR ) 50 MG tablet TAKE 1 TABLET (50 MG TOTAL) BY MOUTH 2 (TWO) TIMES DAILY. FOR BLOOD PRESSURE. 180 tablet 3 Taking   Multiple Vitamins-Minerals (PRESERVISION AREDS 2 PO) Take 2  capsules by mouth daily.   Taking   Multiple Vitamins-Minerals (WOMENS 50+ MULTI VITAMIN) TABS Take 1 tablet by mouth daily.   Taking   Probiotic Product (PROBIOTIC PO) Take 1 capsule by mouth daily.   Taking   sertraline  (ZOLOFT ) 50 MG tablet Take 1 tablet (50 mg total) by mouth daily. For anxiety 90  tablet 1 Taking   simvastatin  (ZOCOR ) 40 MG tablet TAKE 1 TABLET BY MOUTH EVERY EVENING FOR CHOLESTEROL 90 tablet 2 Taking   VITAMIN D PO Take 1 tablet by mouth daily.   Taking   hydrOXYzine  (ATARAX ) 10 MG tablet Take 1-2 tablets (10-20 mg total) by mouth 2 (two) times daily as needed for anxiety. (Patient not taking: Reported on 11/10/2024) 30 tablet 0 Not Taking   montelukast  (SINGULAIR ) 10 MG tablet TAKE 1 TABLET (10 MG TOTAL) BY MOUTH AT BEDTIME. FOR ALLERGIES (Patient not taking: Reported on 05/13/2024) 90 tablet 2 Not Taking   No Known Allergies  Social History   Tobacco Use   Smoking status: Never   Smokeless tobacco: Never  Substance Use Topics   Alcohol  use: Yes    Comment: social    Family History  Problem Relation Age of Onset   Heart failure Mother    Heart disease Mother    Arthritis Mother    Breast cancer Neg Hx      Review of Systems  Musculoskeletal:  Positive for arthralgias.       Left knee   All other systems reviewed and are negative.   Objective:  Physical Exam Constitutional:      Appearance: Normal appearance.  HENT:     Head: Normocephalic and atraumatic.     Nose: Nose normal.     Mouth/Throat:     Pharynx: Oropharynx is clear.  Eyes:     Extraocular Movements: Extraocular movements intact.  Pulmonary:     Effort: Pulmonary effort is normal.  Abdominal:     Palpations: Abdomen is soft.  Musculoskeletal:     Cervical back: Normal range of motion.     Comments: Both knees move about 0-110.  She might have slightly greater motion on the right.  She has medial joint line pain on the left and crepitation but no effusion.  Hip motion is good and straight leg raise is negative.  She has normal unlabored respirations.  Sensation and motor function are intact in her feet with palpable pulses on both sides.   Skin:    General: Skin is warm and dry.  Neurological:     General: No focal deficit present.     Mental Status: She is alert and oriented to  person, place, and time. Mental status is at baseline.  Psychiatric:        Mood and Affect: Mood normal.        Behavior: Behavior normal.        Thought Content: Thought content normal.        Judgment: Judgment normal.     Vital signs in last 24 hours:    Labs:   Estimated body mass index is 27.78 kg/m as calculated from the following:   Height as of 11/13/24: 5' 1 (1.549 m).   Weight as of 11/13/24: 66.7 kg.   Imaging Review Plain radiographs demonstrate severe degenerative joint disease of the left knee(s). The overall alignment isneutral. The bone quality appears to be good for age and reported activity level.      Assessment/Plan:  End stage primary arthritis, left  knee   The patient history, physical examination, clinical judgment of the provider and imaging studies are consistent with end stage degenerative joint disease of the left knee(s) and total knee arthroplasty is deemed medically necessary. The treatment options including medical management, injection therapy arthroscopy and arthroplasty were discussed at length. The risks and benefits of total knee arthroplasty were presented and reviewed. The risks due to aseptic loosening, infection, stiffness, patella tracking problems, thromboembolic complications and other imponderables were discussed. The patient acknowledged the explanation, agreed to proceed with the plan and consent was signed. Patient is being admitted for inpatient treatment for surgery, pain control, PT, OT, prophylactic antibiotics, VTE prophylaxis, progressive ambulation and ADL's and discharge planning. The patient is planning to be discharged home with home health services

## 2024-11-23 MED ORDER — TRANEXAMIC ACID 1000 MG/10ML IV SOLN
2000.0000 mg | INTRAVENOUS | Status: AC
  Filled 2024-11-23: qty 20

## 2024-11-24 ENCOUNTER — Observation Stay (HOSPITAL_COMMUNITY)
Admission: RE | Admit: 2024-11-24 | Discharge: 2024-11-25 | Disposition: A | Source: Ambulatory Visit | Attending: Orthopaedic Surgery | Admitting: Orthopaedic Surgery

## 2024-11-24 ENCOUNTER — Ambulatory Visit (HOSPITAL_COMMUNITY): Payer: Self-pay | Admitting: Physician Assistant

## 2024-11-24 ENCOUNTER — Encounter (HOSPITAL_COMMUNITY): Payer: Self-pay | Admitting: Orthopaedic Surgery

## 2024-11-24 ENCOUNTER — Encounter (HOSPITAL_COMMUNITY)
Admission: RE | Disposition: A | Discharge status: Home / Self Care | Payer: Self-pay | Source: Ambulatory Visit | Attending: Orthopaedic Surgery

## 2024-11-24 ENCOUNTER — Ambulatory Visit (HOSPITAL_BASED_OUTPATIENT_CLINIC_OR_DEPARTMENT_OTHER): Payer: Self-pay | Admitting: Anesthesiology

## 2024-11-24 ENCOUNTER — Other Ambulatory Visit: Payer: Self-pay

## 2024-11-24 DIAGNOSIS — Z96651 Presence of right artificial knee joint: Secondary | ICD-10-CM | POA: Insufficient documentation

## 2024-11-24 DIAGNOSIS — Z7901 Long term (current) use of anticoagulants: Secondary | ICD-10-CM | POA: Diagnosis not present

## 2024-11-24 DIAGNOSIS — I4891 Unspecified atrial fibrillation: Secondary | ICD-10-CM | POA: Diagnosis not present

## 2024-11-24 DIAGNOSIS — Z79899 Other long term (current) drug therapy: Secondary | ICD-10-CM | POA: Diagnosis not present

## 2024-11-24 DIAGNOSIS — M1711 Unilateral primary osteoarthritis, right knee: Principal | ICD-10-CM

## 2024-11-24 DIAGNOSIS — M1712 Unilateral primary osteoarthritis, left knee: Secondary | ICD-10-CM | POA: Diagnosis not present

## 2024-11-24 DIAGNOSIS — G8918 Other acute postprocedural pain: Secondary | ICD-10-CM | POA: Diagnosis not present

## 2024-11-24 DIAGNOSIS — I10 Essential (primary) hypertension: Secondary | ICD-10-CM | POA: Diagnosis not present

## 2024-11-24 DIAGNOSIS — F411 Generalized anxiety disorder: Secondary | ICD-10-CM

## 2024-11-24 DIAGNOSIS — M25562 Pain in left knee: Secondary | ICD-10-CM | POA: Diagnosis present

## 2024-11-24 DIAGNOSIS — Z96659 Presence of unspecified artificial knee joint: Principal | ICD-10-CM | POA: Insufficient documentation

## 2024-11-24 HISTORY — PX: TOTAL KNEE ARTHROPLASTY: SHX125

## 2024-11-24 SURGERY — ARTHROPLASTY, KNEE, TOTAL
Anesthesia: Monitor Anesthesia Care | Site: Knee | Laterality: Left

## 2024-11-24 MED ORDER — FENTANYL CITRATE (PF) 100 MCG/2ML IJ SOLN
INTRAMUSCULAR | Status: DC | PRN
  Administered 2024-11-24 (×2): 50 ug via INTRAVENOUS

## 2024-11-24 MED ORDER — STERILE WATER FOR IRRIGATION IR SOLN
Status: DC | PRN
  Administered 2024-11-24: 2000 mL

## 2024-11-24 MED ORDER — DOCUSATE SODIUM 100 MG PO CAPS
100.0000 mg | ORAL_CAPSULE | Freq: Two times a day (BID) | ORAL | Status: DC
  Administered 2024-11-24 – 2024-11-25 (×2): 100 mg via ORAL
  Filled 2024-11-24 (×2): qty 1

## 2024-11-24 MED ORDER — BUPIVACAINE LIPOSOME 1.3 % IJ SUSP
INTRAMUSCULAR | Status: AC
  Filled 2024-11-24: qty 20

## 2024-11-24 MED ORDER — FENTANYL CITRATE (PF) 50 MCG/ML IJ SOSY: 25.0000 ug | PREFILLED_SYRINGE | INTRAMUSCULAR | Status: DC | PRN

## 2024-11-24 MED ORDER — PROPOFOL 500 MG/50ML IV EMUL
INTRAVENOUS | Status: DC | PRN
  Administered 2024-11-24: 10 mg via INTRAVENOUS
  Administered 2024-11-24: 100 ug/kg/min via INTRAVENOUS
  Administered 2024-11-24: 10 mg via INTRAVENOUS
  Administered 2024-11-24: 20 mg via INTRAVENOUS

## 2024-11-24 MED ORDER — 0.9 % SODIUM CHLORIDE (POUR BTL) OPTIME
TOPICAL | Status: DC | PRN
  Administered 2024-11-24: 1000 mL

## 2024-11-24 MED ORDER — ONDANSETRON HCL 4 MG/2ML IJ SOLN: 4.0000 mg | Freq: Once | INTRAMUSCULAR | Status: DC | PRN

## 2024-11-24 MED ORDER — OXYCODONE HCL 5 MG PO TABS: 5.0000 mg | ORAL_TABLET | Freq: Once | ORAL | Status: DC | PRN

## 2024-11-24 MED ORDER — CEFAZOLIN SODIUM-DEXTROSE 2-4 GM/100ML-% IV SOLN
2.0000 g | INTRAVENOUS | Status: AC
  Administered 2024-11-24: 2 g via INTRAVENOUS
  Filled 2024-11-24: qty 100

## 2024-11-24 MED ORDER — ALUM & MAG HYDROXIDE-SIMETH 200-200-20 MG/5ML PO SUSP: 30.0000 mL | ORAL | Status: DC | PRN

## 2024-11-24 MED ORDER — KETOROLAC TROMETHAMINE 15 MG/ML IJ SOLN
7.5000 mg | Freq: Four times a day (QID) | INTRAMUSCULAR | Status: AC
  Administered 2024-11-24 – 2024-11-25 (×3): 7.5 mg via INTRAVENOUS
  Filled 2024-11-24 (×3): qty 1

## 2024-11-24 MED ORDER — LIDOCAINE 2% (20 MG/ML) 5 ML SYRINGE: INTRAMUSCULAR | Status: DC | PRN

## 2024-11-24 MED ORDER — ACETAMINOPHEN 500 MG PO TABS
500.0000 mg | ORAL_TABLET | Freq: Four times a day (QID) | ORAL | Status: AC
  Administered 2024-11-24 – 2024-11-25 (×4): 500 mg via ORAL
  Filled 2024-11-24 (×4): qty 1

## 2024-11-24 MED ORDER — ACETAMINOPHEN 325 MG PO TABS: 325.0000 mg | ORAL_TABLET | Freq: Four times a day (QID) | ORAL | Status: DC | PRN

## 2024-11-24 MED ORDER — TRANEXAMIC ACID 1000 MG/10ML IV SOLN
INTRAVENOUS | Status: DC | PRN
  Administered 2024-11-24: 2000 mg via TOPICAL

## 2024-11-24 MED ORDER — BUPIVACAINE IN DEXTROSE 0.75-8.25 % IT SOLN
INTRATHECAL | Status: DC | PRN
  Administered 2024-11-24: 1.8 mL via INTRATHECAL

## 2024-11-24 MED ORDER — DIPHENHYDRAMINE HCL 12.5 MG/5ML PO ELIX: 12.5000 mg | ORAL_SOLUTION | ORAL | Status: DC | PRN

## 2024-11-24 MED ORDER — TRANEXAMIC ACID-NACL 1000-0.7 MG/100ML-% IV SOLN
1000.0000 mg | INTRAVENOUS | Status: AC
  Administered 2024-11-24: 1000 mg via INTRAVENOUS
  Filled 2024-11-24: qty 100

## 2024-11-24 MED ORDER — HYDROCODONE-ACETAMINOPHEN 5-325 MG PO TABS
1.0000 | ORAL_TABLET | ORAL | Status: DC | PRN
  Administered 2024-11-24: 1 via ORAL
  Filled 2024-11-24 (×2): qty 1

## 2024-11-24 MED ORDER — BUPIVACAINE-EPINEPHRINE (PF) 0.5% -1:200000 IJ SOLN
INTRAMUSCULAR | Status: AC
  Filled 2024-11-24: qty 30

## 2024-11-24 MED ORDER — METHOCARBAMOL 1000 MG/10ML IJ SOLN: 500.0000 mg | Freq: Four times a day (QID) | INTRAMUSCULAR | Status: DC | PRN

## 2024-11-24 MED ORDER — ACETAMINOPHEN 10 MG/ML IV SOLN: 1000.0000 mg | Freq: Once | INTRAVENOUS | Status: DC | PRN

## 2024-11-24 MED ORDER — ONDANSETRON HCL 4 MG/2ML IJ SOLN
INTRAMUSCULAR | Status: DC | PRN
  Administered 2024-11-24: 4 mg via INTRAVENOUS

## 2024-11-24 MED ORDER — METOCLOPRAMIDE HCL 5 MG PO TABS: 5.0000 mg | ORAL_TABLET | Freq: Three times a day (TID) | ORAL | Status: DC | PRN

## 2024-11-24 MED ORDER — LACTATED RINGERS IV SOLN: INTRAVENOUS | Status: DC

## 2024-11-24 MED ORDER — SODIUM CHLORIDE (PF) 0.9 % IJ SOLN
INTRAMUSCULAR | Status: AC
  Filled 2024-11-24: qty 30

## 2024-11-24 MED ORDER — AMISULPRIDE (ANTIEMETIC) 5 MG/2ML IV SOLN: 10.0000 mg | Freq: Once | INTRAVENOUS | Status: DC | PRN

## 2024-11-24 MED ORDER — TRAMADOL HCL 50 MG PO TABS
50.0000 mg | ORAL_TABLET | Freq: Four times a day (QID) | ORAL | Status: DC
  Administered 2024-11-24: 50 mg via ORAL
  Filled 2024-11-24 (×2): qty 1

## 2024-11-24 MED ORDER — CEFAZOLIN SODIUM-DEXTROSE 2-4 GM/100ML-% IV SOLN
2.0000 g | Freq: Once | INTRAVENOUS | Status: AC
  Administered 2024-11-24: 2 g via INTRAVENOUS
  Filled 2024-11-24: qty 100

## 2024-11-24 MED ORDER — CHLORHEXIDINE GLUCONATE 0.12 % MT SOLN
15.0000 mL | Freq: Once | OROMUCOSAL | Status: AC
  Administered 2024-11-24: 15 mL via OROMUCOSAL

## 2024-11-24 MED ORDER — APIXABAN 2.5 MG PO TABS
2.5000 mg | ORAL_TABLET | Freq: Two times a day (BID) | ORAL | Status: DC
  Administered 2024-11-25: 2.5 mg via ORAL
  Filled 2024-11-24: qty 1

## 2024-11-24 MED ORDER — FENTANYL CITRATE (PF) 100 MCG/2ML IJ SOLN
INTRAMUSCULAR | Status: AC
  Filled 2024-11-24: qty 2

## 2024-11-24 MED ORDER — MENTHOL 3 MG MT LOZG: 1.0000 | LOZENGE | OROMUCOSAL | Status: DC | PRN

## 2024-11-24 MED ORDER — ORAL CARE MOUTH RINSE: 15.0000 mL | Freq: Once | OROMUCOSAL | Status: AC

## 2024-11-24 MED ORDER — PHENOL 1.4 % MT LIQD: 1.0000 | OROMUCOSAL | Status: DC | PRN

## 2024-11-24 MED ORDER — OXYCODONE HCL 5 MG/5ML PO SOLN: 5.0000 mg | Freq: Once | ORAL | Status: DC | PRN

## 2024-11-24 MED ORDER — SODIUM CHLORIDE (PF) 0.9 % IJ SOLN
INTRAMUSCULAR | Status: DC | PRN
  Administered 2024-11-24: 80 mL

## 2024-11-24 MED ORDER — PROPOFOL 10 MG/ML IV BOLUS
INTRAVENOUS | Status: AC
  Filled 2024-11-24: qty 20

## 2024-11-24 MED ORDER — ORAL CARE MOUTH RINSE
15.0000 mL | OROMUCOSAL | Status: DC | PRN
Start: 2024-11-24 — End: 2024-11-25

## 2024-11-24 MED ORDER — BUPIVACAINE LIPOSOME 1.3 % IJ SUSP: 20.0000 mL | Freq: Once | INTRAMUSCULAR | Status: AC

## 2024-11-24 MED ORDER — PROPOFOL 1000 MG/100ML IV EMUL
INTRAVENOUS | Status: AC
  Filled 2024-11-24: qty 100

## 2024-11-24 MED ORDER — SODIUM CHLORIDE 0.9 % IR SOLN
Status: DC | PRN
  Administered 2024-11-24: 3000 mL

## 2024-11-24 MED ORDER — METOCLOPRAMIDE HCL 5 MG/ML IJ SOLN: 5.0000 mg | Freq: Three times a day (TID) | INTRAMUSCULAR | Status: DC | PRN

## 2024-11-24 MED ORDER — ROPIVACAINE HCL 5 MG/ML IJ SOLN
INTRAMUSCULAR | Status: DC | PRN
  Administered 2024-11-24: 20 mL via PERINEURAL

## 2024-11-24 MED ORDER — METHOCARBAMOL 500 MG PO TABS
500.0000 mg | ORAL_TABLET | Freq: Four times a day (QID) | ORAL | Status: DC | PRN
  Administered 2024-11-24 (×2): 500 mg via ORAL
  Filled 2024-11-24 (×2): qty 1

## 2024-11-24 MED ORDER — DEXAMETHASONE SOD PHOSPHATE PF 10 MG/ML IJ SOLN
INTRAMUSCULAR | Status: DC | PRN
  Administered 2024-11-24: 5 mg via INTRAVENOUS

## 2024-11-24 MED ORDER — POVIDONE-IODINE 10 % EX SWAB: 2.0000 | Freq: Once | CUTANEOUS | Status: DC

## 2024-11-24 MED ORDER — PHENYLEPHRINE HCL-NACL 20-0.9 MG/250ML-% IV SOLN
INTRAVENOUS | Status: DC | PRN
  Administered 2024-11-24: 50 ug/min via INTRAVENOUS

## 2024-11-24 SURGICAL SUPPLY — 49 items
ATTUNE PS FEM LT SZ 5 CEM KNEE (Femur) IMPLANT
ATTUNE PSRP INSR SZ 5 10M KNEE (Insert) IMPLANT
BAG COUNTER SPONGE SURGICOUNT (BAG) ×1 IMPLANT
BAG DECANTER FOR FLEXI CONT (MISCELLANEOUS) ×1 IMPLANT
BAG ZIPLOCK 12X15 (MISCELLANEOUS) ×1 IMPLANT
BASE TIBIA ATTUNE KNEE SYS SZ6 (Knees) IMPLANT
BLADE SAGITTAL 25.0X1.19X90 (BLADE) ×1 IMPLANT
BLADE SAW SGTL 11.0X1.19X90.0M (BLADE) ×1 IMPLANT
BLADE SURG SZ10 CARB STEEL (BLADE) ×1 IMPLANT
BNDG ELASTIC 6INX 5YD STR LF (GAUZE/BANDAGES/DRESSINGS) ×1 IMPLANT
BOOTIES KNEE HIGH SLOAN (MISCELLANEOUS) ×1 IMPLANT
BOWL SMART MIX CTS (DISPOSABLE) ×1 IMPLANT
CEMENT HV SMART SET (Cement) ×2 IMPLANT
COVER SURGICAL LIGHT HANDLE (MISCELLANEOUS) ×1 IMPLANT
CUFF TRNQT CYL 34X4.125X (TOURNIQUET CUFF) ×1 IMPLANT
DRAPE TOP 10253 STERILE (DRAPES) ×1 IMPLANT
DRAPE U-SHAPE 47X51 STRL (DRAPES) ×1 IMPLANT
DRSG AQUACEL AG ADV 3.5X10 (GAUZE/BANDAGES/DRESSINGS) ×1 IMPLANT
DURAPREP 26ML APPLICATOR (WOUND CARE) ×2 IMPLANT
ELECT PENCIL ROCKER SW 15FT (MISCELLANEOUS) ×1 IMPLANT
ELECT REM PT RETURN 15FT ADLT (MISCELLANEOUS) ×1 IMPLANT
GLOVE BIO SURGEON STRL SZ8 (GLOVE) ×2 IMPLANT
GLOVE BIOGEL PI IND STRL 7.0 (GLOVE) ×1 IMPLANT
GLOVE BIOGEL PI IND STRL 8 (GLOVE) ×2 IMPLANT
GLOVE SURG SYN 7.0 PF PI (GLOVE) ×1 IMPLANT
GOWN SRG XL LVL 4 BRTHBL STRL (GOWNS) ×1 IMPLANT
GOWN STRL REUS W/ TWL XL LVL3 (GOWN DISPOSABLE) ×2 IMPLANT
HOLDER FOLEY CATH W/STRAP (MISCELLANEOUS) IMPLANT
HOOD PEEL AWAY T7 (MISCELLANEOUS) ×3 IMPLANT
KIT TURNOVER KIT A (KITS) ×1 IMPLANT
MANIFOLD NEPTUNE II (INSTRUMENTS) ×1 IMPLANT
NS IRRIG 1000ML POUR BTL (IV SOLUTION) ×1 IMPLANT
PACK TOTAL KNEE CUSTOM (KITS) ×1 IMPLANT
PAD ARMBOARD POSITIONER FOAM (MISCELLANEOUS) ×1 IMPLANT
PATELLA MEDIAL ATTUN 35MM KNEE (Knees) IMPLANT
PIN STEINMAN FIXATION KNEE (PIN) IMPLANT
PROTECTOR NERVE ULNAR (MISCELLANEOUS) ×1 IMPLANT
SET HNDPC FAN SPRY TIP SCT (DISPOSABLE) ×1 IMPLANT
SPIKE FLUID TRANSFER (MISCELLANEOUS) ×2 IMPLANT
SUT ETHIBOND NAB CT1 #1 30IN (SUTURE) ×1 IMPLANT
SUT VIC AB 0 CT1 36 (SUTURE) ×1 IMPLANT
SUT VIC AB 2-0 CT1 TAPERPNT 27 (SUTURE) ×1 IMPLANT
SUT VICRYL AB 3-0 FS1 BRD 27IN (SUTURE) ×1 IMPLANT
SUTURE STRATFX 0 PDS 27 VIOLET (SUTURE) ×1 IMPLANT
TRAY FOLEY MTR SLVR 14FR STAT (SET/KITS/TRAYS/PACK) IMPLANT
TRAY FOLEY MTR SLVR 16FR STAT (SET/KITS/TRAYS/PACK) IMPLANT
WATER STERILE IRR 1000ML POUR (IV SOLUTION) ×2 IMPLANT
WRAP KNEE MAXI GEL POST OP (GAUZE/BANDAGES/DRESSINGS) ×1 IMPLANT
YANKAUER SUCT BULB TIP NO VENT (SUCTIONS) ×1 IMPLANT

## 2024-11-24 NOTE — Addendum Note (Signed)
 Addendum  created 11/24/24 1354 by Waddell Lauraine NOVAK, MD   Child order released for a procedure order, Clinical Note Signed, Intraprocedure Blocks edited, Intraprocedure Event edited, Intraprocedure Meds edited, SmartForm saved

## 2024-11-24 NOTE — Anesthesia Procedure Notes (Signed)
 Spinal  Patient location during procedure: OR Start time: 11/24/2024 7:33 AM End time: 11/24/2024 9:39 AM Reason for block: surgical anesthesia Staffing Performed: resident/CRNA  Resident/CRNA: Franchot Delon RAMAN, CRNA Performed by: Franchot Delon RAMAN, CRNA Authorized by: Waddell Lauraine NOVAK, MD   Preanesthetic Checklist Completed: patient identified, IV checked, site marked, risks and benefits discussed, surgical consent, monitors and equipment checked, pre-op evaluation and timeout performed Spinal Block Patient position: sitting Prep: DuraPrep Patient monitoring: heart rate, cardiac monitor, continuous pulse ox and blood pressure Approach: midline Location: L4-5 Injection technique: single-shot Needle Needle type: Pencan  Needle gauge: 24 G Needle length: 9 cm Assessment Sensory level: T4 Events: CSF return Additional Notes Pt to OR, to sitting position for spinal placement. Monitors, O2 on. Careful sterile draping and technique for spinal insertion. CSF return, no paresthesia. Pt tol well.

## 2024-11-24 NOTE — Transfer of Care (Signed)
 Immediate Anesthesia Transfer of Care Note  Patient: Jenna Hogan  Procedure(s) Performed: LEFT TOTAL KNEE ARTHROPLASTY (Left: Knee)  Patient Location: PACU  Anesthesia Type:Spinal  Level of Consciousness: awake, alert , oriented, and patient cooperative  Airway & Oxygen Therapy: Patient Spontanous Breathing and Patient connected to face mask oxygen  Post-op Assessment: Report given to RN and Post -op Vital signs reviewed and stable  Post vital signs: Reviewed and stable  Last Vitals:  Vitals Value Taken Time  BP 130/75 11/24/24 09:26  Temp    Pulse 58 11/24/24 09:29  Resp 14 11/24/24 09:29  SpO2 100 % 11/24/24 09:29  Vitals shown include unfiled device data.  Last Pain:  Vitals:   11/24/24 0612  PainSc: 0-No pain         Complications: No notable events documented.

## 2024-11-24 NOTE — Op Note (Signed)
 PREOP DIAGNOSIS: DJD LEFT KNEE POSTOP DIAGNOSIS:  same PROCEDURE: LEFT TKR ANESTHESIA: Spinal and MAC ATTENDING SURGEON: Maude KANDICE Herald ASSISTANT: Prentice Earl PA  INDICATIONS FOR PROCEDURE: Jenna Hogan is a 83 y.o. female who has struggled for a long time with pain due to degenerative arthritis of the left knee.  The patient has failed many conservative non-operative measures and at this point has pain which limits the ability to sleep and walk.  The patient is offered total knee replacement.  Informed operative consent was obtained after discussion of possible risks of anesthesia, infection, neurovascular injury, DVT, and death.  The importance of the post-operative rehabilitation protocol to optimize result was stressed extensively with the patient.  SUMMARY OF FINDINGS AND PROCEDURE:  Jenna Hogan was taken to the operative suite where under the above anesthesia a left knee replacement was performed.  There were advanced degenerative changes and the bone quality was good.  We used the DePuyAttune system and placed size 5 femur, 6 tibia, 35 mm all polyethylene patella, and a size 10 mm spacer.  Prentice Earl PA-C assisted throughout and was invaluable to the completion of the case in that he helped retract and maintain exposure while I placed the components.  He also helped close thereby minimizing OR time.  The patient was admitted for appropriate post-op care to include perioperative antibiotics and mechanical and pharmacologic measures for DVT prophylaxis.  DESCRIPTION OF PROCEDURE:  Jenna Hogan was taken to the operative suite where the above anesthesia was applied.  The patient was positioned supine and prepped and draped in normal sterile fashion.  An appropriate time out was performed.  After the administration of kefzol  pre-op antibiotic the leg was elevated and exsanguinated and a tourniquet inflated.  A standard longitudinal incision was made on the anterior knee.  Dissection  was carried down to the extensor mechanism.  All appropriate anti-infective measures were used including the pre-operative antibiotic, betadine  impregnated drape, and closed hooded exhaust systems for each member of the surgical team.  A medial parapatellar incision was made in the extensor mechanism and the knee cap flipped and the knee flexed.  Some residual meniscal tissues were removed along with any remaining ACL/PCL tissue.  A guide was placed on the tibia and a flat cut was made on it's superior surface.  An intramedullary guide was placed in the femur and was utilized to make anterior and posterior cuts creating an appropriate flexion gap.  A second intramedullary guide was placed in the femur to make a distal cut properly balancing the knee with an extension gap equal to the flexion gap.  The three bones sized to the above mentioned sizes and the appropriate guides were placed and utilized.  A trial reduction was done and the knee easily came to full extension and the patella tracked well on flexion.  The trial components were removed and all bones were cleaned with pulsatile lavage and then dried thoroughly.  Cement was mixed and was pressurized onto the bones followed by placement of the aforementioned components.  Excess cement was trimmed and pressure was held on the components until the cement had hardened.  The tourniquet was deflated and a small amount of bleeding was controlled with cautery and pressure.  The knee was irrigated thoroughly.  The extensor mechanism was re-approximated with #1 ethibond in interrupted fashion.  The knee was flexed and the repair was solid.  The subcutaneous tissues were re-approximated with #0 and #2-0 vicryl and the skin closed with a  subcuticular stitch and steristrips.  A sterile dressing was applied.  Intraoperative fluids, EBL, and tourniquet time can be obtained from anesthesia records.  DISPOSITION:  The patient was taken to recovery room in stable condition  and scheduled to potentially go home same day depending on ability to walk and tolerate liquids.  Maude KANDICE Herald 11/24/2024, 8:59 AM

## 2024-11-24 NOTE — Anesthesia Procedure Notes (Signed)
 Anesthesia Regional Block: Adductor canal block   Pre-Anesthetic Checklist: , timeout performed,  Correct Patient, Correct Site, Correct Laterality,  Correct Procedure, Correct Position, site marked,  Risks and benefits discussed,  Surgical consent,  Pre-op evaluation,  At surgeon's request and post-op pain management  Laterality: Lower and Left  Prep: chloraprep       Needles:  Injection technique: Single-shot  Needle Type: Echogenic Stimulator Needle     Needle Length: 10cm  Needle Gauge: 20     Additional Needles:   Procedures:,,,, ultrasound used (permanent image in chart),, #20gu IV placed     Nerve Stimulator or Paresthesia:  Response: quadraceps contraction  Additional Responses:   Narrative:  Start time: 11/24/2024 6:47 AM End time: 11/24/2024 6:47 AM Injection made incrementally with aspirations every 5 mL.  Performed by: Personally  Anesthesiologist: Waddell Lauraine NOVAK, MD  Additional Notes: Timeout performed with bedside RN - Name, DOB, allergies and laterality confirmed by the patient and RN. Surgical marking performed/confirmed. Anticoagulation status and most recent platelet count reviewed. Patient placed in a frog-leg position and sedation (as documented by RN) given via PIV. Peripheral nerve block performed as documented above. VSS throughout (see Flowchart).

## 2024-11-24 NOTE — Anesthesia Postprocedure Evaluation (Signed)
 Anesthesia Post Note  Patient: Jenna Hogan  Procedure(s) Performed: LEFT TOTAL KNEE ARTHROPLASTY (Left: Knee)     Patient location during evaluation: PACU Anesthesia Type: MAC and Spinal Level of consciousness: awake Pain management: pain level controlled Vital Signs Assessment: post-procedure vital signs reviewed and stable Respiratory status: spontaneous breathing Cardiovascular status: blood pressure returned to baseline Postop Assessment: no apparent nausea or vomiting Anesthetic complications: no   No notable events documented.              Lauraine KATHEE [place]

## 2024-11-24 NOTE — Interval H&P Note (Signed)
 History and Physical Interval Note:  11/24/2024 7:20 AM  Jenna Hogan  has presented today for surgery, with the diagnosis of LEFT KNEE DEGENERATIVE JOINT DISEASE.  The various methods of treatment have been discussed with the patient and family. After consideration of risks, benefits and other options for treatment, the patient has consented to  Procedure(s): ARTHROPLASTY, KNEE, TOTAL (Left) as a surgical intervention.  The patient's history has been reviewed, patient examined, no change in status, stable for surgery.  I have reviewed the patient's chart and labs.  Questions were answered to the patient's satisfaction.     [name]

## 2024-11-24 NOTE — Anesthesia Procedure Notes (Signed)
 Procedure Name: MAC Date/Time: 11/24/2024 7:28 AM  Performed by: Franchot Delon RAMAN, CRNAPre-anesthesia Checklist: Patient identified, Emergency Drugs available, Suction available and Patient being monitored Oxygen Delivery Method: Simple face mask Ventilation: Oral airway inserted - appropriate to patient size Placement Confirmation: positive ETCO2 Dental Injury: Teeth and Oropharynx as per pre-operative assessment

## 2024-11-24 NOTE — Evaluation (Signed)
 Physical Therapy Evaluation Patient Details Name: Jenna Hogan MRN: [id number] DOB: 11-06-1941 Today's Date: 11/24/2024  History of Present Illness  83 yo female presents to therapy s/p L TKA on 11/24/24. Pt PMH includes but is not limited to: R TKA 04/2024 OA, A-fib, L hip  and LE pain, CAD, chronic LBP, HLD, HTN, and carpal tunnel release.  Clinical Impression  Pt is s/p TKA resulting in the deficits listed below (see PT Problem List).  Pt amb 40' with RW, CGA for safety. Pain beginning to incr slightly, RN made aware.   Pt will benefit from acute skilled PT to increase their independence and safety with mobility to allow discharge.          If plan is discharge home, recommend the following: A little help with bathing/dressing/bathroom;Help with stairs or ramp for entrance;Assist for transportation;Assistance with cooking/housework   Can travel by private vehicle        Equipment Recommendations None recommended by PT  Recommendations for Other Services       Functional Status Assessment Patient has had a recent decline in their functional status and demonstrates the ability to make significant improvements in function in a reasonable and predictable amount of time.     Precautions / Restrictions Precautions Precautions: Fall;Knee Restrictions Weight Bearing Restrictions Per Provider Order: No Other Position/Activity Restrictions: WBAT      Mobility  Bed Mobility Overal bed mobility: Needs Assistance Bed Mobility: Supine to Sit     Supine to sit: Contact guard     General bed mobility comments: incr time, able to self assist LLE off EOB. CG for safety    Transfers Overall transfer level: Needs assistance Equipment used: Rolling walker (2 wheels) Transfers: Sit to/from Stand Sit to Stand: Contact guard assist           General transfer comment: cues for hand placement and LLE position    Ambulation/Gait Ambulation/Gait assistance: Contact guard  assist Gait Distance (Feet): 75 Feet Assistive device: Rolling walker (2 wheels) Gait Pattern/deviations: Step-to pattern       General Gait Details: cues for sequence and RW position  Stairs            Wheelchair Mobility     Tilt Bed    Modified Rankin (Stroke Patients Only)       Balance                                             Pertinent Vitals/Pain Pain Assessment Pain Assessment: 0-10 Pain Score: 4  Pain Location: L knee Pain Descriptors / Indicators: Aching, Sore Pain Intervention(s): Limited activity within patient's tolerance, Monitored during session, Patient requesting pain meds-RN notified, Ice applied    Home Living Family/patient expects to be discharged to:: Private residence Living Arrangements: Alone Available Help at Discharge: Family Type of Home: Mobile home Home Access: Stairs to enter Entrance Stairs-Rails: Left Entrance Stairs-Number of Steps: 4   Home Layout: One level Home Equipment: Agricultural Consultant (2 wheels);Cane - single point      Prior Function Prior Level of Function : Independent/Modified Independent;Driving             Mobility Comments: mod I for all ADLs self care tasks and IADLs with use of [facility]       Extremity/Trunk Assessment   Upper Extremity Assessment Upper Extremity Assessment: Overall WFL for tasks assessed  Lower Extremity Assessment Lower Extremity Assessment: LLE deficits/detail LLE Deficits / Details: ankle WFL, knee extension /hip flexion 3/5       Communication   Communication Communication: No apparent difficulties    Cognition Arousal: Alert Behavior During Therapy: WFL for tasks assessed/performed   PT - Cognitive impairments: No apparent impairments                         Following commands: Intact       Cueing Cueing Techniques: Verbal cues     General Comments      Exercises Total Joint Exercises Ankle Circles/Pumps: AROM, Both, 5 reps    Assessment/Plan    PT Assessment Patient needs continued PT services  PT Problem List Decreased strength;Decreased range of motion;Decreased activity tolerance;Decreased knowledge of use of DME;Pain;Decreased mobility       PT Treatment Interventions DME instruction;Therapeutic exercise;Gait training;Functional mobility training;Therapeutic activities;Patient/family education;Stair training    PT Goals (Current goals can be found in the Care Plan section)  Acute Rehab PT Goals PT Goal Formulation: With patient Time For Goal Achievement: 12/01/24 Potential to Achieve Goals: Good    Frequency 7X/week     Co-evaluation               AM-PAC PT 6 Clicks Mobility  Outcome Measure Help needed turning from your back to your side while in a flat bed without using bedrails?: A Little Help needed moving from lying on your back to sitting on the side of a flat bed without using bedrails?: A Little Help needed moving to and from a bed to a chair (including a wheelchair)?: A Little Help needed standing up from a chair using your arms (e.g., wheelchair or bedside chair)?: A Little Help needed to walk in hospital room?: A Little Help needed climbing 3-5 steps with a railing? : A Little 6 Click Score: 18    End of Session Equipment Utilized During Treatment: Gait belt Activity Tolerance: Patient tolerated treatment well Patient left: with call bell/phone within reach;in chair;with chair alarm set Nurse Communication: Mobility status PT Visit Diagnosis: Other abnormalities of gait and mobility (R26.89)    Time: 8656-8596 PT Time Calculation (min) (ACUTE ONLY): 20 min   Charges:   PT Evaluation $PT Eval Low Complexity: 1 Low   PT General Charges $$ ACUTE PT VISIT: 1 Visit         Leyah Bocchino, PT  Acute Rehab Dept [facility]) [phone]  11/24/2024   [facility] 11/24/2024, 3:51 PM

## 2024-11-25 ENCOUNTER — Encounter (HOSPITAL_COMMUNITY): Payer: Self-pay | Admitting: Orthopaedic Surgery

## 2024-11-25 DIAGNOSIS — M1712 Unilateral primary osteoarthritis, left knee: Secondary | ICD-10-CM | POA: Diagnosis not present

## 2024-11-25 NOTE — Progress Notes (Signed)
 Subjective: 1 Day Post-Op Procedure(s) (LRB): LEFT TOTAL KNEE ARTHROPLASTY (Left) Patient reports pain as mild.  Patient did well with therapy yesterday.  Taking by mouth and voiding okay.  Wants to go home today.  2 adult children of hers are with her today.  Objective: Vital signs in last 24 hours: Temp:  [97.5 F (36.4 C)-98 F (36.7 C)] 97.5 F (36.4 C) (11/26 0942) Pulse Rate:  [33-95] 92 (11/26 0942) Resp:  [13-18] 18 (11/26 0942) BP: (119-167)/(60-105) 133/72 (11/26 0942) SpO2:  [92 %-100 %] 100 % (11/26 0613) FiO2 (%):  [21 %] 21 % (11/25 1157)  Intake/Output from previous day: 11/25 0701 - 11/26 0700 In: 2369.9 [P.O.:240; I.V.:1829.9; IV Piggyback:300] Out: 1035 [Urine:1010; Blood:25] Intake/Output this shift: Total I/O In: 442.5 [P.O.:240; I.V.:202.5] Out: -   No results for input(s): HGB in the last 72 hours. No results for input(s): WBC, RBC, HCT, PLT in the last 72 hours. No results for input(s): NA, K, CL, CO2, BUN, CREATININE, GLUCOSE, CALCIUM in the last 72 hours. No results for input(s): LABPT, INR in the last 72 hours. Left knee exam: Neurovascular intact Sensation intact distally Dorsiflexion/Plantar flexion intact Incision: dressing C/D/I Compartment soft   Assessment/Plan: 1 Day Post-Op Procedure(s) (LRB): LEFT TOTAL KNEE ARTHROPLASTY (Left) Plan: Up with therapy Discharge home with home health Resume her preop Eliquis  dose.  Follow-up with Dr. Dalldorf in approximately 10 to 14 days.    Patient's anticipated LOS is less than 2 midnights, meeting these requirements: - Lives within 1 hour of care - Has a competent adult at home to recover with post-op recover - NO history of  - Chronic pain requiring opiods  - Diabetes  - Coronary Artery Disease  - Heart failure  - Heart attack  - Stroke  - DVT/VTE  - Cardiac arrhythmia  - Respiratory Failure/COPD  - Renal failure  - Anemia  - Advanced Liver  disease     Lynwood KANDICE Reining 11/25/2024, 10:19 AM

## 2024-11-25 NOTE — Discharge Summary (Signed)
 Patient ID: Jenna Hogan MRN: [id number] DOB/AGE: 85-Oct-1942 85 y.o.  Admit date: 11/24/2024 Discharge date: 11/25/2024  Admission Diagnoses:  Principal Problem:   S/P TKR (total knee replacement) Active Problems:   History of total knee replacement (TKR)   Discharge Diagnoses:  Same  Past Medical History:  Diagnosis Date   A-fib [facility])    Anxiety    Arthritis    Depression    Dysrhythmia    Essential hypertension    Fatigue    Generalized abdominal pain 03/09/2022   Hypercholesteremia    Urinary incontinence     Surgeries: Procedure(s): LEFT TOTAL KNEE ARTHROPLASTY on 11/24/2024   Consultants:   Discharged Condition: Improved  Hospital Course: Jolleen Seman is an 83 y.o. female who was admitted 11/24/2024 for operative treatment ofS/P TKR (total knee replacement). Patient has severe unremitting pain that affects sleep, daily activities, and work/hobbies. After pre-op clearance the patient was taken to the operating room on 11/24/2024 and underwent  Procedure(s): LEFT TOTAL KNEE ARTHROPLASTY.    Patient was given perioperative antibiotics:  Anti-infectives (From admission, onward)    Start     Dose/Rate Route Frequency Ordered Stop   11/24/24 1400  ceFAZolin  (ANCEF ) IVPB 2g/100 mL premix        2 g 200 mL/hr over 30 Minutes Intravenous  Once 11/24/24 1156 11/24/24 1459   11/24/24 0600  ceFAZolin  (ANCEF ) IVPB 2g/100 mL premix        2 g 200 mL/hr over 30 Minutes Intravenous On call to O.R. 11/24/24 0556 11/24/24 0739        Patient was given sequential compression devices, early ambulation, and chemoprophylaxis to prevent DVT.  Inpatient Morphine  Milligram Equivalents Per Day 11/25 - 11/26   Values displayed are in units of MME/Day    Order Start / End Date Yesterday Today    oxyCODONE  (Oxy IR/ROXICODONE ) immediate release tablet 5 mg 11/25 - 11/25 0 of Unknown --    oxyCODONE  (ROXICODONE ) 5 MG/5ML solution 5 mg 11/25 - 11/25 0 of Unknown --       Group total: 0 of Unknown     fentaNYL  (SUBLIMAZE ) injection 25-50 mcg 11/25 - 11/25 0 of 45-90 --    fentaNYL  (SUBLIMAZE ) injection 11/25 - 11/25 *30 of 30 --    HYDROcodone -acetaminophen  (NORCO/VICODIN) 5-325 MG per tablet 1-2 tablet 11/25 - No end date 5 of 20-40 0 of 30-60    traMADol  (ULTRAM ) tablet 50 mg 11/25 - No end date 5 of 5 0 of 20    Daily Totals  * 40 of Unknown (at least 100-165) 0 of 50-80  *One-Step medication  Calculation Errors     Order Type Date Details   oxyCODONE  (Oxy IR/ROXICODONE ) immediate release tablet 5 mg Ordered Dose -- Insufficient frequency information   oxyCODONE  (ROXICODONE ) 5 MG/5ML solution 5 mg Ordered Dose -- Insufficient frequency information            Patient benefited maximally from hospital stay and there were no complications.    Recent vital signs: Patient Vitals for the past 24 hrs:  BP Temp Temp src Pulse Resp SpO2  11/25/24 0942 133/72 (!) 97.5 F (36.4 C) -- 92 18 --  11/25/24 0613 (!) 159/75 97.8 F (36.6 C) Oral 79 16 100 %  11/25/24 0026 123/72 97.7 F (36.5 C) Oral 65 16 96 %  11/24/24 2113 126/66 (!) 97.5 F (36.4 C) Oral (!) 58 15 99 %  11/24/24 1819 119/60 98 F (36.7 C) Oral 95 18 99 %  11/24/24 1358 134/70 97.6 F (36.4 C) Oral 67 18 100 %  11/24/24 1210 (!) 167/105 97.8 F (36.6 C) Oral 63 17 92 %  11/24/24 1157 -- -- -- -- -- 94 %  11/24/24 1145 (!) 160/75 -- -- (!) 33 16 100 %  11/24/24 1130 (!) 146/91 -- -- (!) 54 14 100 %  11/24/24 1115 (!) 163/82 -- -- (!) 51 13 100 %  11/24/24 1100 (!) 163/84 -- -- 70 17 100 %  11/24/24 1045 (!) 156/81 -- -- (!) 53 14 100 %  11/24/24 1030 (!) 148/83 -- -- 67 13 100 %     Recent laboratory studies: No results for input(s): WBC, HGB, HCT, PLT, NA, K, CL, CO2, BUN, CREATININE, GLUCOSE, INR, CALCIUM in the last 72 hours.  Invalid input(s): PT, 2   Discharge Medications:   Allergies as of 11/25/2024       Reactions   Tramadol  Other (See  Comments)   Confusion        Medication List     STOP taking these medications    hydrOXYzine  10 MG tablet Commonly known as: ATARAX    montelukast  10 MG tablet Commonly known as: SINGULAIR        TAKE these medications    apixaban  5 MG Tabs tablet Commonly known as: ELIQUIS  Take 1 tablet (5 mg total) by mouth 2 (two) times daily. For stroke prevention   CALCIUM 600 + D PO Take 1 tablet by mouth daily.   cetirizine 10 MG tablet Commonly known as: ZYRTEC Take 10 mg by mouth daily.   metoprolol  tartrate 50 MG tablet Commonly known as: LOPRESSOR  TAKE 1 TABLET (50 MG TOTAL) BY MOUTH 2 (TWO) TIMES DAILY. FOR BLOOD PRESSURE.   PRESERVISION AREDS 2 PO Take 2 capsules by mouth daily.   Womens 50+ Multi Vitamin Tabs Take 1 tablet by mouth daily.   PROBIOTIC PO Take 1 capsule by mouth daily.   sertraline  50 MG tablet Commonly known as: Zoloft  Take 1 tablet (50 mg total) by mouth daily. For anxiety   simvastatin  40 MG tablet Commonly known as: ZOCOR  TAKE 1 TABLET BY MOUTH EVERY EVENING FOR CHOLESTEROL   TYLENOL  PO Take 1 tablet by mouth daily as needed (pain).   VITAMIN D PO Take 1 tablet by mouth daily.               Durable Medical Equipment  (From admission, onward)           Start     Ordered   11/24/24 1157  DME Walker rolling  Once       Question Answer Comment  Walker: With 5 Inch Wheels   Patient needs a walker to treat with the following condition S/P TKR (total knee replacement)      11/24/24 1156   11/24/24 1157  DME 3 n 1  Once        11/24/24 1156   11/24/24 1157  DME Bedside commode  Once       Question:  Patient needs a bedside commode to treat with the following condition  Answer:  S/P TKR (total knee replacement)   11/24/24 1156              Discharge Care Instructions  (From admission, onward)           Start     Ordered   11/25/24 0000  Weight bearing as tolerated       Question Answer Comment  Laterality  left  Extremity Lower      11/25/24 1026            Diagnostic Studies: No results found.  Disposition: Discharge disposition: 01-Home or Self Care       Discharge Instructions     Call MD / Call 911   Complete by: As directed    If you experience chest pain or shortness of breath, CALL 911 and be transported to the hospital emergency room.  If you develope a fever above 101 F, pus (white drainage) or increased drainage or redness at the wound, or calf pain, call your surgeon's office.   Diet - low sodium heart healthy   Complete by: As directed    Diet general   Complete by: As directed    Increase activity slowly as tolerated   Complete by: As directed    Post-operative opioid taper instructions:   Complete by: As directed    POST-OPERATIVE OPIOID TAPER INSTRUCTIONS: It is important to wean off of your opioid medication as soon as possible. If you do not need pain medication after your surgery it is ok to stop day one. Opioids include: Codeine, Hydrocodone (Norco, Vicodin), Oxycodone (Percocet, oxycontin ) and hydromorphone  amongst others.  Long term and even short term use of opiods can cause: Increased pain response Dependence Constipation Depression Respiratory depression And more.  Withdrawal symptoms can include Flu like symptoms Nausea, vomiting And more Techniques to manage these symptoms Hydrate well Eat regular healthy meals Stay active Use relaxation techniques(deep breathing, meditating, yoga) Do Not substitute Alcohol  to help with tapering If you have been on opioids for less than two weeks and do not have pain than it is ok to stop all together.  Plan to wean off of opioids This plan should start within one week post op of your joint replacement. Maintain the same interval or time between taking each dose and first decrease the dose.  Cut the total daily intake of opioids by one tablet each day Next start to increase the time between doses. The  last dose that should be eliminated is the evening dose.      Weight bearing as tolerated   Complete by: As directed    Laterality: left   Extremity: Lower        Follow-up Information     Sheril Coy, MD. Go on 12/04/2024.   Specialty: Orthopedic Surgery Why: your appointment is scheduled for 9:30 Contact information: [address] [place] [address] [phone]         [facility] Follow up.   Why: HHPT will provide 6 home visits prior to starting outpatient physical therapy        [facility] Go on 12/04/2024.   Why: your outpatient physical therapy has been scheduled. they will call you with a time Contact information: Physical Therapy [address] [place] [address] [phone]                  Signed: Lynwood KANDICE Reining 11/25/2024, 10:27 AM

## 2024-11-25 NOTE — TOC Transition Note (Signed)
 Transition of Care [facility]) - Discharge Note   Patient Details  Name: Jenna Hogan MRN: [id number] Date of Birth: 05-Jun-1941  Transition of [facility]) CM/SW Contact:  Alfonse JONELLE Rex, RN Phone Number: 11/25/2024, 10:21 AM   Clinical Narrative:   Met with patient at bedside to review dc therapy and home equipment needs, dc therapy HH PT-[facility], has RW. MOON completed. No INPT CM needs     Final next level of care: Home w Home Health Services Barriers to Discharge: No Barriers Identified   Patient Goals and CMS Choice Patient states their goals for this hospitalization and ongoing recovery are:: return home          Discharge Placement                       Discharge Plan and Services Additional resources added to the After Visit Summary for                            [facility] Arranged: PT HH Agency: [facility]        Social Drivers of Health (SDOH) Interventions SDOH Screenings   Food Insecurity: No Food Insecurity (11/24/2024)  Housing: Low Risk  (11/24/2024)  Transportation Needs: No Transportation Needs (11/24/2024)  Utilities: Not At Risk (11/24/2024)  Alcohol  Screen: Low Risk  (03/11/2024)  Depression (PHQ2-9): Low Risk  (03/11/2024)  Financial Resource Strain: Low Risk  (03/11/2024)  Physical Activity: Insufficiently Active (03/11/2024)  Social Connections: Moderately Integrated (11/24/2024)  Stress: Patient Declined (09/15/2024)  Tobacco Use: Low Risk  (11/24/2024)  Health Literacy: Adequate Health Literacy (03/11/2024)     Readmission Risk Interventions     No data to display

## 2024-11-25 NOTE — Progress Notes (Signed)
 Physical Therapy Treatment Patient Details Name: Jenna Hogan MRN: [id number] DOB: 10/01/1941 Today's Date: 11/25/2024   History of Present Illness 83 yo female presents to therapy s/p L TKA on 11/24/24. Pt PMH includes but is not limited to: R TKA 04/2024 OA, A-fib, L hip  and LE pain, CAD, chronic LBP, HLD, HTN, and carpal tunnel release.    PT Comments  Pt meeting goals, ready to d/c home with family assist as needed from PT standpoint.     If plan is discharge home, recommend the following: Help with stairs or ramp for entrance;Assist for transportation;Assistance with cooking/housework   Can travel by private vehicle        Equipment Recommendations  None recommended by PT    Recommendations for Other Services       Precautions / Restrictions Precautions Precautions: Fall;Knee Recall of Precautions/Restrictions: Impaired Restrictions Weight Bearing Restrictions Per Provider Order: No Other Position/Activity Restrictions: WBAT     Mobility  Bed Mobility   Bed Mobility: Supine to Sit, Sit to Supine     Supine to sit: Modified independent (Device/Increase time) Sit to supine: Modified independent (Device/Increase time)        Transfers Overall transfer level: Needs assistance Equipment used: Rolling walker (2 wheels) Transfers: Sit to/from Stand Sit to Stand: Supervision, Contact guard assist           General transfer comment: cues for hand placement and LLE position    Ambulation/Gait Ambulation/Gait assistance: Supervision, Contact guard assist Gait Distance (Feet): 120 Feet Assistive device: Rolling walker (2 wheels) Gait Pattern/deviations: Step-through pattern       General Gait Details: cues for sequence, progression to step through. good stability, no LOB   Stairs Stairs: Yes Stairs assistance: Contact guard assist, Supervision Stair Management: One rail Left, Step to pattern, With cane, Forwards Number of Stairs: 2 General stair  comments: cues for sequence, use of cane; reviewed use of cane  as needed at home   Wheelchair Mobility     Tilt Bed    Modified Rankin (Stroke Patients Only)       Balance                                            Communication Communication Communication: No apparent difficulties  Cognition Arousal: Alert Behavior During Therapy: WFL for tasks assessed/performed   PT - Cognitive impairments: No apparent impairments                         Following commands: Intact      Cueing Cueing Techniques: Verbal cues  Exercises Total Joint Exercises Ankle Circles/Pumps: AROM, Both, 5 reps Quad Sets: AROM, Strengthening, Both, 10 reps Heel Slides: AAROM, Left, 10 reps Straight Leg Raises: AROM, Left, 10 reps    General Comments        Pertinent Vitals/Pain Pain Assessment Pain Assessment: 0-10 Pain Score: 4  Pain Location: L knee Pain Descriptors / Indicators: Aching, Sore Pain Intervention(s): Limited activity within patient's tolerance, Monitored during session, Premedicated before session, Repositioned    Home Living                          Prior Function            PT Goals (current goals can now be found in the care plan  section) Acute Rehab PT Goals PT Goal Formulation: With patient Time For Goal Achievement: 12/01/24 Potential to Achieve Goals: Good Progress towards PT goals: Progressing toward goals    Frequency    7X/week      PT Plan      Co-evaluation              AM-PAC PT 6 Clicks Mobility   Outcome Measure  Help needed turning from your back to your side while in a flat bed without using bedrails?: None Help needed moving from lying on your back to sitting on the side of a flat bed without using bedrails?: None Help needed moving to and from a bed to a chair (including a wheelchair)?: A Little Help needed standing up from a chair using your arms (e.g., wheelchair or bedside chair)?: A  Little Help needed to walk in hospital room?: A Little Help needed climbing 3-5 steps with a railing? : A Little 6 Click Score: 20    End of Session Equipment Utilized During Treatment: Gait belt Activity Tolerance: Patient tolerated treatment well Patient left: in bed;with call bell/phone within reach;with bed alarm set Nurse Communication: Mobility status PT Visit Diagnosis: Other abnormalities of gait and mobility (R26.89)     Time: 8982-8960 PT Time Calculation (min) (ACUTE ONLY): 22 min  Charges:    $Gait Training: 8-22 mins PT General Charges $$ ACUTE PT VISIT: 1 Visit                     Austyn Perriello, PT  Acute Rehab Dept [facility]) [phone]  11/25/2024    [facility] 11/25/2024, 10:44 AM

## 2024-11-25 NOTE — Care Management Obs Status (Signed)
 MEDICARE OBSERVATION STATUS NOTIFICATION   Patient Details  Name: Flor Whitacre MRN: [id number] Date of Birth: Jul 21, 1941   Medicare Observation Status Notification Given:       Alfonse JONELLE Rex, RN 11/25/2024, 9:23 AM

## 2024-11-25 NOTE — Progress Notes (Signed)
Patient discharged to home w/ family. Given all belongings, instructions. Verbalized understanding of instructions. Escorted to pov via w/c. 

## 2024-11-27 DIAGNOSIS — E669 Obesity, unspecified: Secondary | ICD-10-CM | POA: Diagnosis not present

## 2024-11-27 DIAGNOSIS — Z96653 Presence of artificial knee joint, bilateral: Secondary | ICD-10-CM | POA: Diagnosis not present

## 2024-11-27 DIAGNOSIS — Z6826 Body mass index (BMI) 26.0-26.9, adult: Secondary | ICD-10-CM | POA: Diagnosis not present

## 2024-11-27 DIAGNOSIS — G8929 Other chronic pain: Secondary | ICD-10-CM | POA: Diagnosis not present

## 2024-11-27 DIAGNOSIS — I119 Hypertensive heart disease without heart failure: Secondary | ICD-10-CM | POA: Diagnosis not present

## 2024-11-27 DIAGNOSIS — F419 Anxiety disorder, unspecified: Secondary | ICD-10-CM | POA: Diagnosis not present

## 2024-11-27 DIAGNOSIS — Z7901 Long term (current) use of anticoagulants: Secondary | ICD-10-CM | POA: Diagnosis not present

## 2024-11-27 DIAGNOSIS — R7303 Prediabetes: Secondary | ICD-10-CM | POA: Diagnosis not present

## 2024-11-27 DIAGNOSIS — I4891 Unspecified atrial fibrillation: Secondary | ICD-10-CM | POA: Diagnosis not present

## 2024-11-27 DIAGNOSIS — F32A Depression, unspecified: Secondary | ICD-10-CM | POA: Diagnosis not present

## 2024-12-04 DIAGNOSIS — Z96652 Presence of left artificial knee joint: Secondary | ICD-10-CM | POA: Diagnosis not present

## 2024-12-04 DIAGNOSIS — M1711 Unilateral primary osteoarthritis, right knee: Secondary | ICD-10-CM | POA: Diagnosis not present

## 2024-12-04 DIAGNOSIS — Z96653 Presence of artificial knee joint, bilateral: Secondary | ICD-10-CM | POA: Diagnosis not present

## 2024-12-04 DIAGNOSIS — R2689 Other abnormalities of gait and mobility: Secondary | ICD-10-CM | POA: Diagnosis not present

## 2024-12-09 DIAGNOSIS — R2689 Other abnormalities of gait and mobility: Secondary | ICD-10-CM | POA: Diagnosis not present

## 2024-12-09 DIAGNOSIS — Z96652 Presence of left artificial knee joint: Secondary | ICD-10-CM | POA: Diagnosis not present

## 2024-12-09 DIAGNOSIS — M25562 Pain in left knee: Secondary | ICD-10-CM | POA: Diagnosis not present

## 2024-12-15 ENCOUNTER — Other Ambulatory Visit: Payer: Self-pay | Admitting: Primary Care

## 2024-12-15 DIAGNOSIS — F411 Generalized anxiety disorder: Secondary | ICD-10-CM

## 2024-12-15 NOTE — Telephone Encounter (Signed)
Patient is due for CPE/follow up in late February, this will be required prior to any further refills.  Please schedule, thank you!

## 2024-12-18 NOTE — Telephone Encounter (Signed)
 Called and schedule pt for Cpe

## 2024-12-30 ENCOUNTER — Other Ambulatory Visit: Payer: Self-pay | Admitting: Primary Care

## 2024-12-30 DIAGNOSIS — E785 Hyperlipidemia, unspecified: Secondary | ICD-10-CM

## 2025-01-31 ENCOUNTER — Other Ambulatory Visit: Payer: Self-pay | Admitting: Primary Care

## 2025-01-31 DIAGNOSIS — I4891 Unspecified atrial fibrillation: Secondary | ICD-10-CM

## 2025-02-15 ENCOUNTER — Encounter (INDEPENDENT_AMBULATORY_CARE_PROVIDER_SITE_OTHER): Admitting: Ophthalmology

## 2025-02-26 ENCOUNTER — Encounter: Admitting: Primary Care

## 2025-03-15 ENCOUNTER — Ambulatory Visit
